# Patient Record
Sex: Male | Born: 1960
Health system: Southern US, Community
[De-identification: ages and names within clinical notes are randomized; demographics above are authoritative.]

## PROBLEM LIST (undated history)

## (undated) DIAGNOSIS — K625 Hemorrhage of anus and rectum: Secondary | ICD-10-CM

## (undated) DIAGNOSIS — K7460 Unspecified cirrhosis of liver: Secondary | ICD-10-CM

## (undated) DIAGNOSIS — F32A Depression, unspecified: Secondary | ICD-10-CM

## (undated) DIAGNOSIS — G629 Polyneuropathy, unspecified: Secondary | ICD-10-CM

## (undated) DIAGNOSIS — K429 Umbilical hernia without obstruction or gangrene: Secondary | ICD-10-CM

## (undated) DIAGNOSIS — L565 Disseminated superficial actinic porokeratosis (DSAP): Secondary | ICD-10-CM

## (undated) DIAGNOSIS — C8590 Non-Hodgkin lymphoma, unspecified, unspecified site: Secondary | ICD-10-CM

## (undated) DIAGNOSIS — C801 Malignant (primary) neoplasm, unspecified: Secondary | ICD-10-CM

## (undated) DIAGNOSIS — R42 Dizziness and giddiness: Secondary | ICD-10-CM

## (undated) DIAGNOSIS — R911 Solitary pulmonary nodule: Secondary | ICD-10-CM

## (undated) DIAGNOSIS — Z8619 Personal history of other infectious and parasitic diseases: Secondary | ICD-10-CM

## (undated) DIAGNOSIS — D649 Anemia, unspecified: Secondary | ICD-10-CM

## (undated) DIAGNOSIS — I10 Essential (primary) hypertension: Secondary | ICD-10-CM

## (undated) DIAGNOSIS — F329 Major depressive disorder, single episode, unspecified: Secondary | ICD-10-CM

## (undated) DIAGNOSIS — B1920 Unspecified viral hepatitis C without hepatic coma: Secondary | ICD-10-CM

## (undated) DIAGNOSIS — H269 Unspecified cataract: Secondary | ICD-10-CM

## (undated) DIAGNOSIS — F209 Schizophrenia, unspecified: Secondary | ICD-10-CM

## (undated) DIAGNOSIS — K219 Gastro-esophageal reflux disease without esophagitis: Secondary | ICD-10-CM

## (undated) HISTORY — DX: Unspecified cataract: H26.9

## (undated) HISTORY — DX: Unspecified cirrhosis of liver: K74.60

## (undated) HISTORY — PX: POLYPECTOMY: SHX149

## (undated) HISTORY — DX: Hemorrhage of anus and rectum: K62.5

## (undated) HISTORY — DX: Schizophrenia, unspecified: F20.9

## (undated) HISTORY — DX: Essential (primary) hypertension: I10

## (undated) HISTORY — PX: COLONOSCOPY: SHX174

## (undated) HISTORY — DX: Unspecified viral hepatitis C without hepatic coma: B19.20

## (undated) HISTORY — DX: Solitary pulmonary nodule: R91.1

## (undated) HISTORY — DX: Depression, unspecified: F32.A

## (undated) HISTORY — DX: Personal history of other infectious and parasitic diseases: Z86.19

## (undated) HISTORY — DX: Non-Hodgkin lymphoma, unspecified, unspecified site: C85.90

## (undated) HISTORY — DX: Anemia, unspecified: D64.9

## (undated) HISTORY — DX: Gastro-esophageal reflux disease without esophagitis: K21.9

---
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
## 1999-03-22 ENCOUNTER — Emergency Department (HOSPITAL_COMMUNITY): Admission: EM | Admit: 1999-03-22 | Discharge: 1999-03-22 | Payer: Self-pay | Admitting: Emergency Medicine

## 1999-03-22 ENCOUNTER — Encounter: Payer: Self-pay | Admitting: Emergency Medicine

## 2000-11-07 ENCOUNTER — Emergency Department (HOSPITAL_COMMUNITY): Admission: EM | Admit: 2000-11-07 | Discharge: 2000-11-07 | Payer: Self-pay | Admitting: Emergency Medicine

## 2000-11-07 ENCOUNTER — Encounter: Payer: Self-pay | Admitting: Internal Medicine

## 2000-11-15 ENCOUNTER — Encounter: Admission: RE | Admit: 2000-11-15 | Discharge: 2000-11-15 | Payer: Self-pay | Admitting: Internal Medicine

## 2001-01-06 ENCOUNTER — Encounter: Admission: RE | Admit: 2001-01-06 | Discharge: 2001-01-06 | Payer: Self-pay | Admitting: Internal Medicine

## 2001-01-06 ENCOUNTER — Encounter: Payer: Self-pay | Admitting: Internal Medicine

## 2001-01-06 ENCOUNTER — Ambulatory Visit (HOSPITAL_COMMUNITY): Admission: RE | Admit: 2001-01-06 | Discharge: 2001-01-06 | Payer: Self-pay | Admitting: Internal Medicine

## 2001-02-02 ENCOUNTER — Emergency Department (HOSPITAL_COMMUNITY): Admission: EM | Admit: 2001-02-02 | Discharge: 2001-02-02 | Payer: Self-pay

## 2001-02-03 ENCOUNTER — Emergency Department (HOSPITAL_COMMUNITY): Admission: EM | Admit: 2001-02-03 | Discharge: 2001-02-03 | Payer: Self-pay | Admitting: Emergency Medicine

## 2001-02-05 ENCOUNTER — Emergency Department (HOSPITAL_COMMUNITY): Admission: EM | Admit: 2001-02-05 | Discharge: 2001-02-05 | Payer: Self-pay

## 2001-02-09 ENCOUNTER — Encounter: Admission: RE | Admit: 2001-02-09 | Discharge: 2001-02-09 | Payer: Self-pay

## 2001-06-08 ENCOUNTER — Encounter: Admission: RE | Admit: 2001-06-08 | Discharge: 2001-06-08 | Payer: Self-pay | Admitting: Internal Medicine

## 2001-06-08 ENCOUNTER — Ambulatory Visit (HOSPITAL_COMMUNITY): Admission: RE | Admit: 2001-06-08 | Discharge: 2001-06-08 | Payer: Self-pay | Admitting: Internal Medicine

## 2001-06-08 ENCOUNTER — Encounter: Payer: Self-pay | Admitting: Internal Medicine

## 2001-06-30 ENCOUNTER — Encounter: Admission: RE | Admit: 2001-06-30 | Discharge: 2001-06-30 | Payer: Self-pay

## 2001-12-14 ENCOUNTER — Ambulatory Visit (HOSPITAL_BASED_OUTPATIENT_CLINIC_OR_DEPARTMENT_OTHER): Admission: RE | Admit: 2001-12-14 | Discharge: 2001-12-14 | Payer: Self-pay | Admitting: General Surgery

## 2001-12-14 ENCOUNTER — Encounter (INDEPENDENT_AMBULATORY_CARE_PROVIDER_SITE_OTHER): Payer: Self-pay | Admitting: *Deleted

## 2002-10-11 ENCOUNTER — Inpatient Hospital Stay (HOSPITAL_COMMUNITY): Admission: AD | Admit: 2002-10-11 | Discharge: 2002-10-12 | Payer: Self-pay | Admitting: Internal Medicine

## 2002-10-11 ENCOUNTER — Encounter: Admission: RE | Admit: 2002-10-11 | Discharge: 2002-10-11 | Payer: Self-pay | Admitting: Internal Medicine

## 2002-10-11 ENCOUNTER — Encounter: Payer: Self-pay | Admitting: Internal Medicine

## 2002-10-11 ENCOUNTER — Ambulatory Visit (HOSPITAL_COMMUNITY): Admission: RE | Admit: 2002-10-11 | Discharge: 2002-10-11 | Payer: Self-pay | Admitting: Internal Medicine

## 2002-10-12 ENCOUNTER — Encounter: Payer: Self-pay | Admitting: Internal Medicine

## 2003-08-14 ENCOUNTER — Encounter: Admission: RE | Admit: 2003-08-14 | Discharge: 2003-08-14 | Payer: Self-pay | Admitting: Internal Medicine

## 2003-09-05 ENCOUNTER — Inpatient Hospital Stay (HOSPITAL_COMMUNITY): Admission: EM | Admit: 2003-09-05 | Discharge: 2003-09-11 | Payer: Self-pay | Admitting: Emergency Medicine

## 2003-09-17 ENCOUNTER — Encounter (HOSPITAL_COMMUNITY): Admission: RE | Admit: 2003-09-17 | Discharge: 2003-09-17 | Payer: Self-pay | Admitting: Dentistry

## 2003-09-25 ENCOUNTER — Encounter: Admission: RE | Admit: 2003-09-25 | Discharge: 2003-09-25 | Payer: Self-pay | Admitting: Internal Medicine

## 2003-12-25 ENCOUNTER — Ambulatory Visit: Payer: Self-pay | Admitting: Dentistry

## 2004-03-09 ENCOUNTER — Ambulatory Visit: Payer: Self-pay | Admitting: Internal Medicine

## 2004-08-04 ENCOUNTER — Ambulatory Visit: Payer: Self-pay | Admitting: Gastroenterology

## 2004-08-11 ENCOUNTER — Encounter (INDEPENDENT_AMBULATORY_CARE_PROVIDER_SITE_OTHER): Payer: Self-pay | Admitting: *Deleted

## 2004-08-11 ENCOUNTER — Ambulatory Visit (HOSPITAL_COMMUNITY): Admission: RE | Admit: 2004-08-11 | Discharge: 2004-08-11 | Payer: Self-pay | Admitting: Obstetrics and Gynecology

## 2005-06-16 ENCOUNTER — Ambulatory Visit: Payer: Self-pay | Admitting: Internal Medicine

## 2005-07-12 ENCOUNTER — Ambulatory Visit (HOSPITAL_COMMUNITY): Admission: RE | Admit: 2005-07-12 | Discharge: 2005-07-12 | Payer: Self-pay | Admitting: Internal Medicine

## 2005-07-12 ENCOUNTER — Ambulatory Visit: Payer: Self-pay | Admitting: Internal Medicine

## 2005-07-28 ENCOUNTER — Ambulatory Visit: Payer: Self-pay | Admitting: Hospitalist

## 2005-10-13 ENCOUNTER — Ambulatory Visit: Payer: Self-pay | Admitting: Internal Medicine

## 2006-03-07 DIAGNOSIS — Z8619 Personal history of other infectious and parasitic diseases: Secondary | ICD-10-CM

## 2006-03-07 DIAGNOSIS — I10 Essential (primary) hypertension: Secondary | ICD-10-CM | POA: Insufficient documentation

## 2006-03-07 DIAGNOSIS — F209 Schizophrenia, unspecified: Secondary | ICD-10-CM | POA: Insufficient documentation

## 2006-03-07 HISTORY — DX: Personal history of other infectious and parasitic diseases: Z86.19

## 2006-10-19 ENCOUNTER — Telehealth: Payer: Self-pay | Admitting: *Deleted

## 2006-11-03 ENCOUNTER — Encounter (INDEPENDENT_AMBULATORY_CARE_PROVIDER_SITE_OTHER): Payer: Self-pay | Admitting: *Deleted

## 2006-11-03 ENCOUNTER — Ambulatory Visit: Payer: Self-pay | Admitting: Internal Medicine

## 2006-11-03 ENCOUNTER — Ambulatory Visit (HOSPITAL_COMMUNITY): Admission: RE | Admit: 2006-11-03 | Discharge: 2006-11-03 | Payer: Self-pay | Admitting: Internal Medicine

## 2006-11-03 DIAGNOSIS — R000 Tachycardia, unspecified: Secondary | ICD-10-CM | POA: Insufficient documentation

## 2006-11-03 DIAGNOSIS — K625 Hemorrhage of anus and rectum: Secondary | ICD-10-CM | POA: Insufficient documentation

## 2006-11-03 DIAGNOSIS — K644 Residual hemorrhoidal skin tags: Secondary | ICD-10-CM | POA: Insufficient documentation

## 2006-11-03 LAB — CONVERTED CEMR LAB
ALT: 34 units/L (ref 0–53)
AST: 39 units/L — ABNORMAL HIGH (ref 0–37)
Albumin: 3.8 g/dL (ref 3.5–5.2)
Alkaline Phosphatase: 89 units/L (ref 39–117)
BUN: 5 mg/dL — ABNORMAL LOW (ref 6–23)
Basophils Absolute: 0.1 10*3/uL (ref 0.0–0.1)
Basophils Relative: 1 % (ref 0–1)
CO2: 29 meq/L (ref 19–32)
Calcium: 9.3 mg/dL (ref 8.4–10.5)
Chloride: 108 meq/L (ref 96–112)
Creatinine, Ser: 1.02 mg/dL (ref 0.40–1.50)
Eosinophils Absolute: 0 10*3/uL (ref 0.0–0.7)
Eosinophils Relative: 0 % (ref 0–5)
Glucose, Bld: 105 mg/dL — ABNORMAL HIGH (ref 70–99)
HCT: 42.1 % (ref 39.0–52.0)
Hemoglobin: 14 g/dL (ref 13.0–17.0)
Lymphocytes Relative: 29 % (ref 12–46)
Lymphs Abs: 2.6 10*3/uL (ref 0.7–3.3)
MCHC: 33.3 g/dL (ref 30.0–36.0)
MCV: 82.1 fL (ref 78.0–100.0)
Monocytes Absolute: 1 10*3/uL — ABNORMAL HIGH (ref 0.2–0.7)
Monocytes Relative: 11 % (ref 3–11)
Neutro Abs: 5.3 10*3/uL (ref 1.7–7.7)
Neutrophils Relative %: 59 % (ref 43–77)
Platelets: 236 10*3/uL (ref 150–400)
Potassium: 3.8 meq/L (ref 3.5–5.3)
RBC: 5.13 M/uL (ref 4.22–5.81)
RDW: 12.9 % (ref 11.5–14.0)
Sodium: 139 meq/L (ref 135–145)
TSH: 1.064 microintl units/mL (ref 0.350–5.50)
Total Bilirubin: 1 mg/dL (ref 0.3–1.2)
Total Protein: 8.2 g/dL (ref 6.0–8.3)
WBC: 9 10*3/uL (ref 4.0–10.5)

## 2006-11-28 ENCOUNTER — Encounter (INDEPENDENT_AMBULATORY_CARE_PROVIDER_SITE_OTHER): Payer: Self-pay | Admitting: *Deleted

## 2006-12-30 DIAGNOSIS — K648 Other hemorrhoids: Secondary | ICD-10-CM | POA: Insufficient documentation

## 2007-03-02 ENCOUNTER — Encounter (INDEPENDENT_AMBULATORY_CARE_PROVIDER_SITE_OTHER): Payer: Self-pay | Admitting: *Deleted

## 2007-03-02 ENCOUNTER — Ambulatory Visit: Payer: Self-pay | Admitting: Internal Medicine

## 2007-03-02 DIAGNOSIS — R3919 Other difficulties with micturition: Secondary | ICD-10-CM | POA: Insufficient documentation

## 2007-03-02 LAB — CONVERTED CEMR LAB
ALT: 30 units/L (ref 0–53)
AST: 35 units/L (ref 0–37)
Albumin: 4.1 g/dL (ref 3.5–5.2)
Alkaline Phosphatase: 111 units/L (ref 39–117)
BUN: 9 mg/dL (ref 6–23)
Basophils Absolute: 0 10*3/uL (ref 0.0–0.1)
Basophils Relative: 0 % (ref 0–1)
CO2: 23 meq/L (ref 19–32)
Calcium: 9.5 mg/dL (ref 8.4–10.5)
Chloride: 104 meq/L (ref 96–112)
Creatinine, Ser: 1.05 mg/dL (ref 0.40–1.50)
Eosinophils Absolute: 0 10*3/uL (ref 0.0–0.7)
Eosinophils Relative: 0 % (ref 0–5)
Glucose, Bld: 99 mg/dL (ref 70–99)
Glucose, Urine, Semiquant: 100
HCT: 42.2 % (ref 39.0–52.0)
Hemoglobin: 13.4 g/dL (ref 13.0–17.0)
Ketones, urine, test strip: NEGATIVE
Lymphocytes Relative: 22 % (ref 12–46)
Lymphs Abs: 1.9 10*3/uL (ref 0.7–3.3)
MCHC: 31.8 g/dL (ref 30.0–36.0)
MCV: 83.4 fL (ref 78.0–100.0)
Monocytes Absolute: 1 10*3/uL — ABNORMAL HIGH (ref 0.2–0.7)
Monocytes Relative: 12 % — ABNORMAL HIGH (ref 3–11)
Neutro Abs: 5.7 10*3/uL (ref 1.7–7.7)
Neutrophils Relative %: 66 % (ref 43–77)
Nitrite: NEGATIVE
Platelets: 312 10*3/uL (ref 150–400)
Potassium: 4.4 meq/L (ref 3.5–5.3)
Protein, U semiquant: 100
RBC: 5.06 M/uL (ref 4.22–5.81)
RDW: 13 % (ref 11.5–14.0)
Sodium: 140 meq/L (ref 135–145)
Specific Gravity, Urine: >=1.03
Total Bilirubin: 1.5 mg/dL — ABNORMAL HIGH (ref 0.3–1.2)
Total Protein: 8.5 g/dL — ABNORMAL HIGH (ref 6.0–8.3)
Urobilinogen, UA: 2
WBC: 8.7 10*3/uL (ref 4.0–10.5)
pH: 5

## 2007-03-09 ENCOUNTER — Encounter (INDEPENDENT_AMBULATORY_CARE_PROVIDER_SITE_OTHER): Payer: Self-pay | Admitting: *Deleted

## 2007-03-09 ENCOUNTER — Ambulatory Visit: Payer: Self-pay | Admitting: Internal Medicine

## 2007-05-19 ENCOUNTER — Encounter (INDEPENDENT_AMBULATORY_CARE_PROVIDER_SITE_OTHER): Payer: Self-pay | Admitting: Infectious Diseases

## 2007-05-19 ENCOUNTER — Ambulatory Visit: Payer: Self-pay | Admitting: Internal Medicine

## 2007-05-19 DIAGNOSIS — R1084 Generalized abdominal pain: Secondary | ICD-10-CM | POA: Insufficient documentation

## 2007-05-24 LAB — CONVERTED CEMR LAB
ALT: 24 units/L (ref 0–53)
AST: 49 units/L — ABNORMAL HIGH (ref 0–37)
Albumin: 3.9 g/dL (ref 3.5–5.2)
Alkaline Phosphatase: 139 units/L — ABNORMAL HIGH (ref 39–117)
BUN: 6 mg/dL (ref 6–23)
CO2: 21 meq/L (ref 19–32)
Calcium: 9.3 mg/dL (ref 8.4–10.5)
Chloride: 104 meq/L (ref 96–112)
Creatinine, Ser: 0.89 mg/dL (ref 0.40–1.50)
Glucose, Bld: 103 mg/dL — ABNORMAL HIGH (ref 70–99)
HCT: 39.9 % (ref 39.0–52.0)
Hemoglobin: 12.4 g/dL — ABNORMAL LOW (ref 13.0–17.0)
Lipase: 16 units/L (ref 0–75)
MCHC: 31.1 g/dL (ref 30.0–36.0)
MCV: 79 fL (ref 78.0–100.0)
Platelets: 305 10*3/uL (ref 150–400)
Potassium: 4.1 meq/L (ref 3.5–5.3)
RBC: 5.05 M/uL (ref 4.22–5.81)
RDW: 14.9 % (ref 11.5–15.5)
Sodium: 140 meq/L (ref 135–145)
Total Bilirubin: 1.5 mg/dL — ABNORMAL HIGH (ref 0.3–1.2)
Total Protein: 7.8 g/dL (ref 6.0–8.3)
WBC: 8 10*3/uL (ref 4.0–10.5)

## 2007-06-08 ENCOUNTER — Encounter (INDEPENDENT_AMBULATORY_CARE_PROVIDER_SITE_OTHER): Payer: Self-pay | Admitting: *Deleted

## 2007-06-08 ENCOUNTER — Ambulatory Visit (HOSPITAL_COMMUNITY): Admission: RE | Admit: 2007-06-08 | Discharge: 2007-06-08 | Payer: Self-pay | Admitting: Internal Medicine

## 2007-06-08 ENCOUNTER — Ambulatory Visit: Payer: Self-pay | Admitting: Internal Medicine

## 2007-06-08 LAB — CONVERTED CEMR LAB
ALT: 19 units/L (ref 0–53)
AST: 54 units/L — ABNORMAL HIGH (ref 0–37)
Albumin: 4.2 g/dL (ref 3.5–5.2)
Alkaline Phosphatase: 131 units/L — ABNORMAL HIGH (ref 39–117)
BUN: 9 mg/dL (ref 6–23)
Basophils Absolute: 0 10*3/uL (ref 0.0–0.1)
Basophils Relative: 0 % (ref 0–1)
CO2: 21 meq/L (ref 19–32)
Calcium: 10 mg/dL (ref 8.4–10.5)
Chloride: 102 meq/L (ref 96–112)
Creatinine, Ser: 0.84 mg/dL (ref 0.40–1.50)
Eosinophils Absolute: 0 10*3/uL (ref 0.0–0.7)
Eosinophils Relative: 0 % (ref 0–5)
Glucose, Bld: 95 mg/dL (ref 70–99)
HCT: 40.6 % (ref 39.0–52.0)
Hemoglobin, Urine: NEGATIVE
Hemoglobin: 12.4 g/dL — ABNORMAL LOW (ref 13.0–17.0)
Ketones, ur: 15 mg/dL — AB
Lipase: 15 units/L (ref 0–75)
Lymphocytes Relative: 20 % (ref 12–46)
Lymphs Abs: 1.8 10*3/uL (ref 0.7–4.0)
MCHC: 30.5 g/dL (ref 30.0–36.0)
MCV: 76.9 fL — ABNORMAL LOW (ref 78.0–100.0)
Monocytes Absolute: 1.4 10*3/uL — ABNORMAL HIGH (ref 0.1–1.0)
Monocytes Relative: 16 % — ABNORMAL HIGH (ref 3–12)
Neutro Abs: 5.7 10*3/uL (ref 1.7–7.7)
Neutrophils Relative %: 63 % (ref 43–77)
Nitrite: NEGATIVE
Platelets: 409 10*3/uL — ABNORMAL HIGH (ref 150–400)
Potassium: 4 meq/L (ref 3.5–5.3)
Protein, ur: NEGATIVE mg/dL
RBC / HPF: NONE SEEN (ref <3)
RBC: 5.28 M/uL (ref 4.22–5.81)
RDW: 15.2 % (ref 11.5–15.5)
Sodium: 140 meq/L (ref 135–145)
Specific Gravity, Urine: 1.022 (ref 1.005–1.03)
TSH: 1.484 microintl units/mL (ref 0.350–5.50)
Total Bilirubin: 1.3 mg/dL — ABNORMAL HIGH (ref 0.3–1.2)
Total Protein: 8.4 g/dL — ABNORMAL HIGH (ref 6.0–8.3)
Urine Glucose: NEGATIVE mg/dL
Urobilinogen, UA: 2 — ABNORMAL HIGH (ref 0.0–1.0)
WBC: 9 10*3/uL (ref 4.0–10.5)
pH: 5.5 (ref 5.0–8.0)

## 2007-06-09 ENCOUNTER — Encounter (INDEPENDENT_AMBULATORY_CARE_PROVIDER_SITE_OTHER): Payer: Self-pay | Admitting: *Deleted

## 2007-06-13 ENCOUNTER — Ambulatory Visit: Payer: Self-pay | Admitting: Internal Medicine

## 2007-06-13 ENCOUNTER — Ambulatory Visit (HOSPITAL_COMMUNITY): Admission: RE | Admit: 2007-06-13 | Discharge: 2007-06-13 | Payer: Self-pay | Admitting: *Deleted

## 2007-06-13 DIAGNOSIS — R935 Abnormal findings on diagnostic imaging of other abdominal regions, including retroperitoneum: Secondary | ICD-10-CM | POA: Insufficient documentation

## 2007-06-13 LAB — CONVERTED CEMR LAB
INR: 1 (ref 0.0–1.5)
Prothrombin Time: 13.3 s (ref 11.6–15.2)
aPTT: 28 s (ref 24–37)

## 2007-06-21 ENCOUNTER — Encounter (INDEPENDENT_AMBULATORY_CARE_PROVIDER_SITE_OTHER): Payer: Self-pay | Admitting: Interventional Radiology

## 2007-06-21 ENCOUNTER — Ambulatory Visit (HOSPITAL_COMMUNITY): Admission: RE | Admit: 2007-06-21 | Discharge: 2007-06-21 | Payer: Self-pay | Admitting: Internal Medicine

## 2007-06-28 ENCOUNTER — Ambulatory Visit: Payer: Self-pay | Admitting: Internal Medicine

## 2007-06-28 ENCOUNTER — Encounter (INDEPENDENT_AMBULATORY_CARE_PROVIDER_SITE_OTHER): Payer: Self-pay | Admitting: Internal Medicine

## 2007-06-28 ENCOUNTER — Encounter (INDEPENDENT_AMBULATORY_CARE_PROVIDER_SITE_OTHER): Payer: Self-pay | Admitting: *Deleted

## 2007-06-30 ENCOUNTER — Encounter (INDEPENDENT_AMBULATORY_CARE_PROVIDER_SITE_OTHER): Payer: Self-pay | Admitting: Interventional Radiology

## 2007-06-30 ENCOUNTER — Ambulatory Visit (HOSPITAL_COMMUNITY): Admission: RE | Admit: 2007-06-30 | Discharge: 2007-06-30 | Payer: Self-pay | Admitting: Hospitalist

## 2007-06-30 LAB — CONVERTED CEMR LAB
AFP-Tumor Marker: 10.2 ng/mL — ABNORMAL HIGH (ref 0.0–8.0)
HCT: 38.2 % — ABNORMAL LOW (ref 39.0–52.0)
Hemoglobin: 12.2 g/dL — ABNORMAL LOW (ref 13.0–17.0)
MCHC: 31.9 g/dL (ref 30.0–36.0)
MCV: 74.2 fL — ABNORMAL LOW (ref 78.0–100.0)
Platelets: 396 10*3/uL (ref 150–400)
RBC: 5.15 M/uL (ref 4.22–5.81)
RDW: 15.6 % — ABNORMAL HIGH (ref 11.5–15.5)
WBC: 10.5 10*3/uL (ref 4.0–10.5)

## 2007-07-03 ENCOUNTER — Ambulatory Visit: Payer: Self-pay | Admitting: Infectious Diseases

## 2007-07-03 DIAGNOSIS — R599 Enlarged lymph nodes, unspecified: Secondary | ICD-10-CM | POA: Insufficient documentation

## 2007-07-10 ENCOUNTER — Ambulatory Visit: Payer: Self-pay | Admitting: Internal Medicine

## 2007-07-10 ENCOUNTER — Inpatient Hospital Stay (HOSPITAL_COMMUNITY): Admission: EM | Admit: 2007-07-10 | Discharge: 2007-08-29 | Payer: Self-pay | Admitting: Emergency Medicine

## 2007-07-13 ENCOUNTER — Encounter: Payer: Self-pay | Admitting: Internal Medicine

## 2007-07-13 ENCOUNTER — Ambulatory Visit: Payer: Self-pay | Admitting: Cardiology

## 2007-07-13 ENCOUNTER — Ambulatory Visit: Payer: Self-pay | Admitting: Internal Medicine

## 2007-08-07 ENCOUNTER — Ambulatory Visit: Payer: Self-pay | Admitting: Psychiatry

## 2007-09-05 ENCOUNTER — Ambulatory Visit: Payer: Self-pay | Admitting: Internal Medicine

## 2007-09-07 ENCOUNTER — Encounter (INDEPENDENT_AMBULATORY_CARE_PROVIDER_SITE_OTHER): Payer: Self-pay | Admitting: *Deleted

## 2007-09-14 LAB — CBC WITH DIFFERENTIAL/PLATELET
BASO%: 1 % (ref 0.0–2.0)
Basophils Absolute: 0.1 10*3/uL (ref 0.0–0.1)
EOS%: 0.2 % (ref 0.0–7.0)
Eosinophils Absolute: 0 10*3/uL (ref 0.0–0.5)
HCT: 36.6 % — ABNORMAL LOW (ref 38.7–49.9)
HGB: 12.1 g/dL — ABNORMAL LOW (ref 13.0–17.1)
LYMPH%: 21.8 % (ref 14.0–48.0)
MCH: 26.3 pg — ABNORMAL LOW (ref 28.0–33.4)
MCHC: 33.1 g/dL (ref 32.0–35.9)
MCV: 79.3 fL — ABNORMAL LOW (ref 81.6–98.0)
MONO#: 1.3 10*3/uL — ABNORMAL HIGH (ref 0.1–0.9)
MONO%: 12.3 % (ref 0.0–13.0)
NEUT#: 7 10*3/uL — ABNORMAL HIGH (ref 1.5–6.5)
NEUT%: 64.8 % (ref 40.0–75.0)
Platelets: 238 10*3/uL (ref 145–400)
RBC: 4.61 10*6/uL (ref 4.20–5.71)
RDW: 18.7 % — ABNORMAL HIGH (ref 11.2–14.6)
WBC: 10.8 10*3/uL — ABNORMAL HIGH (ref 4.0–10.0)
lymph#: 2.4 10*3/uL (ref 0.9–3.3)

## 2007-09-21 LAB — CBC WITH DIFFERENTIAL/PLATELET
BASO%: 0.3 % (ref 0.0–2.0)
Basophils Absolute: 0 10*3/uL (ref 0.0–0.1)
EOS%: 0.7 % (ref 0.0–7.0)
Eosinophils Absolute: 0.1 10*3/uL (ref 0.0–0.5)
HCT: 35.9 % — ABNORMAL LOW (ref 38.7–49.9)
HGB: 11.9 g/dL — ABNORMAL LOW (ref 13.0–17.1)
LYMPH%: 13.2 % — ABNORMAL LOW (ref 14.0–48.0)
MCH: 26.1 pg — ABNORMAL LOW (ref 28.0–33.4)
MCHC: 33.2 g/dL (ref 32.0–35.9)
MCV: 78.8 fL — ABNORMAL LOW (ref 81.6–98.0)
MONO#: 0.3 10*3/uL (ref 0.1–0.9)
MONO%: 2.5 % (ref 0.0–13.0)
NEUT#: 8.7 10*3/uL — ABNORMAL HIGH (ref 1.5–6.5)
NEUT%: 83.3 % — ABNORMAL HIGH (ref 40.0–75.0)
Platelets: 190 10*3/uL (ref 145–400)
RBC: 4.55 10*6/uL (ref 4.20–5.71)
RDW: 19 % — ABNORMAL HIGH (ref 11.2–14.6)
WBC: 10.5 10*3/uL — ABNORMAL HIGH (ref 4.0–10.0)
lymph#: 1.4 10*3/uL (ref 0.9–3.3)

## 2007-09-21 LAB — LACTATE DEHYDROGENASE: LDH: 130 U/L (ref 94–250)

## 2007-09-21 LAB — COMPREHENSIVE METABOLIC PANEL
ALT: 43 U/L (ref 0–53)
AST: 29 U/L (ref 0–37)
Albumin: 4 g/dL (ref 3.5–5.2)
Alkaline Phosphatase: 97 U/L (ref 39–117)
BUN: 9 mg/dL (ref 6–23)
CO2: 24 mEq/L (ref 19–32)
Calcium: 9.7 mg/dL (ref 8.4–10.5)
Chloride: 104 mEq/L (ref 96–112)
Creatinine, Ser: 0.45 mg/dL (ref 0.40–1.50)
Glucose, Bld: 104 mg/dL — ABNORMAL HIGH (ref 70–99)
Potassium: 4.1 mEq/L (ref 3.5–5.3)
Sodium: 138 mEq/L (ref 135–145)
Total Bilirubin: 1.4 mg/dL — ABNORMAL HIGH (ref 0.3–1.2)
Total Protein: 6.9 g/dL (ref 6.0–8.3)

## 2007-09-26 ENCOUNTER — Ambulatory Visit (HOSPITAL_COMMUNITY): Admission: RE | Admit: 2007-09-26 | Discharge: 2007-09-26 | Payer: Self-pay | Admitting: Internal Medicine

## 2007-09-26 ENCOUNTER — Ambulatory Visit: Admission: RE | Admit: 2007-09-26 | Discharge: 2007-09-26 | Payer: Self-pay | Admitting: Internal Medicine

## 2007-09-28 LAB — COMPREHENSIVE METABOLIC PANEL
ALT: 29 U/L (ref 0–53)
AST: 26 U/L (ref 0–37)
Albumin: 3.9 g/dL (ref 3.5–5.2)
Alkaline Phosphatase: 76 U/L (ref 39–117)
BUN: 10 mg/dL (ref 6–23)
CO2: 24 mEq/L (ref 19–32)
Calcium: 9.5 mg/dL (ref 8.4–10.5)
Chloride: 109 mEq/L (ref 96–112)
Creatinine, Ser: 0.55 mg/dL (ref 0.40–1.50)
Glucose, Bld: 104 mg/dL — ABNORMAL HIGH (ref 70–99)
Potassium: 4 mEq/L (ref 3.5–5.3)
Sodium: 144 mEq/L (ref 135–145)
Total Bilirubin: 1 mg/dL (ref 0.3–1.2)
Total Protein: 6.9 g/dL (ref 6.0–8.3)

## 2007-09-28 LAB — CBC WITH DIFFERENTIAL/PLATELET
BASO%: 0.3 % (ref 0.0–2.0)
Basophils Absolute: 0 10*3/uL (ref 0.0–0.1)
EOS%: 0.5 % (ref 0.0–7.0)
Eosinophils Absolute: 0 10*3/uL (ref 0.0–0.5)
HCT: 36.1 % — ABNORMAL LOW (ref 38.7–49.9)
HGB: 12.1 g/dL — ABNORMAL LOW (ref 13.0–17.1)
LYMPH%: 18.7 % (ref 14.0–48.0)
MCH: 26.4 pg — ABNORMAL LOW (ref 28.0–33.4)
MCHC: 33.4 g/dL (ref 32.0–35.9)
MCV: 79 fL — ABNORMAL LOW (ref 81.6–98.0)
MONO#: 0.7 10*3/uL (ref 0.1–0.9)
MONO%: 8.5 % (ref 0.0–13.0)
NEUT#: 6.1 10*3/uL (ref 1.5–6.5)
NEUT%: 72 % (ref 40.0–75.0)
Platelets: 143 10*3/uL — ABNORMAL LOW (ref 145–400)
RBC: 4.57 10*6/uL (ref 4.20–5.71)
RDW: 18.4 % — ABNORMAL HIGH (ref 11.2–14.6)
WBC: 8.4 10*3/uL (ref 4.0–10.0)
lymph#: 1.6 10*3/uL (ref 0.9–3.3)

## 2007-09-28 LAB — LACTATE DEHYDROGENASE: LDH: 105 U/L (ref 94–250)

## 2007-10-05 ENCOUNTER — Ambulatory Visit: Payer: Self-pay | Admitting: Internal Medicine

## 2007-10-05 ENCOUNTER — Encounter (INDEPENDENT_AMBULATORY_CARE_PROVIDER_SITE_OTHER): Payer: Self-pay | Admitting: *Deleted

## 2007-10-05 LAB — LACTATE DEHYDROGENASE: LDH: 151 U/L (ref 94–250)

## 2007-10-05 LAB — COMPREHENSIVE METABOLIC PANEL
ALT: 32 U/L (ref 0–53)
AST: 35 U/L (ref 0–37)
Albumin: 4.1 g/dL (ref 3.5–5.2)
Alkaline Phosphatase: 71 U/L (ref 39–117)
BUN: 10 mg/dL (ref 6–23)
CO2: 24 mEq/L (ref 19–32)
Calcium: 9.9 mg/dL (ref 8.4–10.5)
Chloride: 105 mEq/L (ref 96–112)
Creatinine, Ser: 0.55 mg/dL (ref 0.40–1.50)
Glucose, Bld: 93 mg/dL (ref 70–99)
Potassium: 4.3 mEq/L (ref 3.5–5.3)
Sodium: 141 mEq/L (ref 135–145)
Total Bilirubin: 1 mg/dL (ref 0.3–1.2)
Total Protein: 7.3 g/dL (ref 6.0–8.3)

## 2007-10-05 LAB — CBC WITH DIFFERENTIAL/PLATELET
BASO%: 0.7 % (ref 0.0–2.0)
Basophils Absolute: 0.1 10*3/uL (ref 0.0–0.1)
EOS%: 0.2 % (ref 0.0–7.0)
Eosinophils Absolute: 0 10*3/uL (ref 0.0–0.5)
HCT: 36.4 % — ABNORMAL LOW (ref 38.7–49.9)
HGB: 11.9 g/dL — ABNORMAL LOW (ref 13.0–17.1)
LYMPH%: 18.3 % (ref 14.0–48.0)
MCH: 26.6 pg — ABNORMAL LOW (ref 28.0–33.4)
MCHC: 32.8 g/dL (ref 32.0–35.9)
MCV: 81.1 fL — ABNORMAL LOW (ref 81.6–98.0)
MONO#: 1.2 10*3/uL — ABNORMAL HIGH (ref 0.1–0.9)
MONO%: 11.3 % (ref 0.0–13.0)
NEUT#: 7.2 10*3/uL — ABNORMAL HIGH (ref 1.5–6.5)
NEUT%: 69.6 % (ref 40.0–75.0)
Platelets: 163 10*3/uL (ref 145–400)
RBC: 4.48 10*6/uL (ref 4.20–5.71)
RDW: 16.8 % — ABNORMAL HIGH (ref 11.2–14.6)
WBC: 10.4 10*3/uL — ABNORMAL HIGH (ref 4.0–10.0)
lymph#: 1.9 10*3/uL (ref 0.9–3.3)

## 2007-10-12 LAB — CBC WITH DIFFERENTIAL/PLATELET
BASO%: 0.3 % (ref 0.0–2.0)
Basophils Absolute: 0 10*3/uL (ref 0.0–0.1)
EOS%: 0.5 % (ref 0.0–7.0)
Eosinophils Absolute: 0 10*3/uL (ref 0.0–0.5)
HCT: 33.1 % — ABNORMAL LOW (ref 38.7–49.9)
HGB: 11.3 g/dL — ABNORMAL LOW (ref 13.0–17.1)
LYMPH%: 17.7 % (ref 14.0–48.0)
MCH: 27 pg — ABNORMAL LOW (ref 28.0–33.4)
MCHC: 34 g/dL (ref 32.0–35.9)
MCV: 79.4 fL — ABNORMAL LOW (ref 81.6–98.0)
MONO#: 0.1 10*3/uL (ref 0.1–0.9)
MONO%: 2 % (ref 0.0–13.0)
NEUT#: 4.3 10*3/uL (ref 1.5–6.5)
NEUT%: 79.5 % — ABNORMAL HIGH (ref 40.0–75.0)
Platelets: 169 10*3/uL (ref 145–400)
RBC: 4.17 10*6/uL — ABNORMAL LOW (ref 4.20–5.71)
RDW: 17.3 % — ABNORMAL HIGH (ref 11.2–14.6)
WBC: 5.4 10*3/uL (ref 4.0–10.0)
lymph#: 1 10*3/uL (ref 0.9–3.3)

## 2007-10-12 LAB — COMPREHENSIVE METABOLIC PANEL
ALT: 38 U/L (ref 0–53)
AST: 28 U/L (ref 0–37)
Albumin: 3.9 g/dL (ref 3.5–5.2)
Alkaline Phosphatase: 90 U/L (ref 39–117)
BUN: 10 mg/dL (ref 6–23)
CO2: 27 mEq/L (ref 19–32)
Calcium: 9.3 mg/dL (ref 8.4–10.5)
Chloride: 104 mEq/L (ref 96–112)
Creatinine, Ser: 0.52 mg/dL (ref 0.40–1.50)
Glucose, Bld: 99 mg/dL (ref 70–99)
Potassium: 4.2 mEq/L (ref 3.5–5.3)
Sodium: 139 mEq/L (ref 135–145)
Total Bilirubin: 1.7 mg/dL — ABNORMAL HIGH (ref 0.3–1.2)
Total Protein: 6.7 g/dL (ref 6.0–8.3)

## 2007-10-12 LAB — LACTATE DEHYDROGENASE: LDH: 111 U/L (ref 94–250)

## 2007-10-19 ENCOUNTER — Ambulatory Visit (HOSPITAL_COMMUNITY): Admission: RE | Admit: 2007-10-19 | Discharge: 2007-10-19 | Payer: Self-pay | Admitting: Internal Medicine

## 2007-10-19 LAB — COMPREHENSIVE METABOLIC PANEL
ALT: 34 U/L (ref 0–53)
AST: 32 U/L (ref 0–37)
Albumin: 3.8 g/dL (ref 3.5–5.2)
Alkaline Phosphatase: 73 U/L (ref 39–117)
BUN: 9 mg/dL (ref 6–23)
CO2: 23 mEq/L (ref 19–32)
Calcium: 9.2 mg/dL (ref 8.4–10.5)
Chloride: 107 mEq/L (ref 96–112)
Creatinine, Ser: 0.6 mg/dL (ref 0.40–1.50)
Glucose, Bld: 93 mg/dL (ref 70–99)
Potassium: 4 mEq/L (ref 3.5–5.3)
Sodium: 140 mEq/L (ref 135–145)
Total Bilirubin: 1 mg/dL (ref 0.3–1.2)
Total Protein: 6.6 g/dL (ref 6.0–8.3)

## 2007-10-19 LAB — CBC WITH DIFFERENTIAL/PLATELET
BASO%: 0.4 % (ref 0.0–2.0)
Basophils Absolute: 0 10*3/uL (ref 0.0–0.1)
EOS%: 0.2 % (ref 0.0–7.0)
Eosinophils Absolute: 0 10*3/uL (ref 0.0–0.5)
HCT: 34.6 % — ABNORMAL LOW (ref 38.7–49.9)
HGB: 11.6 g/dL — ABNORMAL LOW (ref 13.0–17.1)
LYMPH%: 14.6 % (ref 14.0–48.0)
MCH: 26.9 pg — ABNORMAL LOW (ref 28.0–33.4)
MCHC: 33.4 g/dL (ref 32.0–35.9)
MCV: 80.5 fL — ABNORMAL LOW (ref 81.6–98.0)
MONO#: 1 10*3/uL — ABNORMAL HIGH (ref 0.1–0.9)
MONO%: 9.7 % (ref 0.0–13.0)
NEUT#: 8 10*3/uL — ABNORMAL HIGH (ref 1.5–6.5)
NEUT%: 75.1 % — ABNORMAL HIGH (ref 40.0–75.0)
Platelets: 181 10*3/uL (ref 145–400)
RBC: 4.3 10*6/uL (ref 4.20–5.71)
RDW: 17.9 % — ABNORMAL HIGH (ref 11.2–14.6)
WBC: 10.6 10*3/uL — ABNORMAL HIGH (ref 4.0–10.0)
lymph#: 1.6 10*3/uL (ref 0.9–3.3)

## 2007-10-19 LAB — LACTATE DEHYDROGENASE: LDH: 130 U/L (ref 94–250)

## 2007-10-24 ENCOUNTER — Ambulatory Visit: Payer: Self-pay | Admitting: Internal Medicine

## 2007-10-26 LAB — CBC WITH DIFFERENTIAL/PLATELET
BASO%: 0.8 % (ref 0.0–2.0)
Basophils Absolute: 0.1 10*3/uL (ref 0.0–0.1)
EOS%: 0.1 % (ref 0.0–7.0)
Eosinophils Absolute: 0 10*3/uL (ref 0.0–0.5)
HCT: 35.2 % — ABNORMAL LOW (ref 38.7–49.9)
HGB: 11.8 g/dL — ABNORMAL LOW (ref 13.0–17.1)
LYMPH%: 16.3 % (ref 14.0–48.0)
MCH: 27.7 pg — ABNORMAL LOW (ref 28.0–33.4)
MCHC: 33.6 g/dL (ref 32.0–35.9)
MCV: 82.5 fL (ref 81.6–98.0)
MONO#: 0.9 10*3/uL (ref 0.1–0.9)
MONO%: 9 % (ref 0.0–13.0)
NEUT#: 7.1 10*3/uL — ABNORMAL HIGH (ref 1.5–6.5)
NEUT%: 73.8 % (ref 40.0–75.0)
Platelets: 208 10*3/uL (ref 145–400)
RBC: 4.26 10*6/uL (ref 4.20–5.71)
RDW: 17.1 % — ABNORMAL HIGH (ref 11.2–14.6)
WBC: 9.6 10*3/uL (ref 4.0–10.0)
lymph#: 1.6 10*3/uL (ref 0.9–3.3)

## 2007-10-26 LAB — COMPREHENSIVE METABOLIC PANEL
ALT: 32 U/L (ref 0–53)
AST: 43 U/L — ABNORMAL HIGH (ref 0–37)
Albumin: 4.3 g/dL (ref 3.5–5.2)
Alkaline Phosphatase: 67 U/L (ref 39–117)
BUN: 11 mg/dL (ref 6–23)
CO2: 25 mEq/L (ref 19–32)
Calcium: 9.8 mg/dL (ref 8.4–10.5)
Chloride: 103 mEq/L (ref 96–112)
Creatinine, Ser: 0.63 mg/dL (ref 0.40–1.50)
Glucose, Bld: 98 mg/dL (ref 70–99)
Potassium: 4.4 mEq/L (ref 3.5–5.3)
Sodium: 141 mEq/L (ref 135–145)
Total Bilirubin: 1 mg/dL (ref 0.3–1.2)
Total Protein: 7.5 g/dL (ref 6.0–8.3)

## 2007-10-26 LAB — LACTATE DEHYDROGENASE: LDH: 154 U/L (ref 94–250)

## 2007-11-02 LAB — CBC WITH DIFFERENTIAL/PLATELET
BASO%: 0.4 % (ref 0.0–2.0)
Basophils Absolute: 0 10*3/uL (ref 0.0–0.1)
EOS%: 0.1 % (ref 0.0–7.0)
Eosinophils Absolute: 0 10*3/uL (ref 0.0–0.5)
HCT: 34.2 % — ABNORMAL LOW (ref 38.7–49.9)
HGB: 11.7 g/dL — ABNORMAL LOW (ref 13.0–17.1)
LYMPH%: 21.2 % (ref 14.0–48.0)
MCH: 27.9 pg — ABNORMAL LOW (ref 28.0–33.4)
MCHC: 34.3 g/dL (ref 32.0–35.9)
MCV: 81.1 fL — ABNORMAL LOW (ref 81.6–98.0)
MONO#: 0.2 10*3/uL (ref 0.1–0.9)
MONO%: 5.3 % (ref 0.0–13.0)
NEUT#: 2.8 10*3/uL (ref 1.5–6.5)
NEUT%: 73 % (ref 40.0–75.0)
Platelets: 136 10*3/uL — ABNORMAL LOW (ref 145–400)
RBC: 4.21 10*6/uL (ref 4.20–5.71)
RDW: 16.9 % — ABNORMAL HIGH (ref 11.2–14.6)
WBC: 3.8 10*3/uL — ABNORMAL LOW (ref 4.0–10.0)
lymph#: 0.8 10*3/uL — ABNORMAL LOW (ref 0.9–3.3)

## 2007-11-02 LAB — COMPREHENSIVE METABOLIC PANEL
ALT: 41 U/L (ref 0–53)
AST: 31 U/L (ref 0–37)
Albumin: 4 g/dL (ref 3.5–5.2)
Alkaline Phosphatase: 94 U/L (ref 39–117)
BUN: 7 mg/dL (ref 6–23)
CO2: 24 mEq/L (ref 19–32)
Calcium: 9.1 mg/dL (ref 8.4–10.5)
Chloride: 104 mEq/L (ref 96–112)
Creatinine, Ser: 0.62 mg/dL (ref 0.40–1.50)
Glucose, Bld: 110 mg/dL — ABNORMAL HIGH (ref 70–99)
Potassium: 3.9 mEq/L (ref 3.5–5.3)
Sodium: 138 mEq/L (ref 135–145)
Total Bilirubin: 1.8 mg/dL — ABNORMAL HIGH (ref 0.3–1.2)
Total Protein: 6.8 g/dL (ref 6.0–8.3)

## 2007-11-02 LAB — LACTATE DEHYDROGENASE: LDH: 154 U/L (ref 94–250)

## 2007-11-09 LAB — CBC WITH DIFFERENTIAL/PLATELET
BASO%: 0.4 % (ref 0.0–2.0)
Basophils Absolute: 0 10*3/uL (ref 0.0–0.1)
EOS%: 0.1 % (ref 0.0–7.0)
Eosinophils Absolute: 0 10*3/uL (ref 0.0–0.5)
HCT: 33.9 % — ABNORMAL LOW (ref 38.7–49.9)
HGB: 11.6 g/dL — ABNORMAL LOW (ref 13.0–17.1)
LYMPH%: 11.1 % — ABNORMAL LOW (ref 14.0–48.0)
MCH: 28 pg (ref 28.0–33.4)
MCHC: 34.2 g/dL (ref 32.0–35.9)
MCV: 82 fL (ref 81.6–98.0)
MONO#: 0.9 10*3/uL (ref 0.1–0.9)
MONO%: 10.1 % (ref 0.0–13.0)
NEUT#: 7.3 10*3/uL — ABNORMAL HIGH (ref 1.5–6.5)
NEUT%: 78.3 % — ABNORMAL HIGH (ref 40.0–75.0)
Platelets: 160 10*3/uL (ref 145–400)
RBC: 4.14 10*6/uL — ABNORMAL LOW (ref 4.20–5.71)
RDW: 16.6 % — ABNORMAL HIGH (ref 11.2–14.6)
WBC: 9.3 10*3/uL (ref 4.0–10.0)
lymph#: 1 10*3/uL (ref 0.9–3.3)

## 2007-11-09 LAB — COMPREHENSIVE METABOLIC PANEL
ALT: 30 U/L (ref 0–53)
AST: 32 U/L (ref 0–37)
Albumin: 3.7 g/dL (ref 3.5–5.2)
Alkaline Phosphatase: 80 U/L (ref 39–117)
BUN: 8 mg/dL (ref 6–23)
CO2: 23 mEq/L (ref 19–32)
Calcium: 8.7 mg/dL (ref 8.4–10.5)
Chloride: 106 mEq/L (ref 96–112)
Creatinine, Ser: 0.69 mg/dL (ref 0.40–1.50)
Glucose, Bld: 89 mg/dL (ref 70–99)
Potassium: 3.7 mEq/L (ref 3.5–5.3)
Sodium: 139 mEq/L (ref 135–145)
Total Bilirubin: 0.9 mg/dL (ref 0.3–1.2)
Total Protein: 6.3 g/dL (ref 6.0–8.3)

## 2007-11-09 LAB — LACTATE DEHYDROGENASE: LDH: 136 U/L (ref 94–250)

## 2007-11-16 LAB — COMPREHENSIVE METABOLIC PANEL
ALT: 34 U/L (ref 0–53)
AST: 50 U/L — ABNORMAL HIGH (ref 0–37)
Albumin: 4 g/dL (ref 3.5–5.2)
Alkaline Phosphatase: 92 U/L (ref 39–117)
BUN: 8 mg/dL (ref 6–23)
CO2: 21 mEq/L (ref 19–32)
Calcium: 9 mg/dL (ref 8.4–10.5)
Chloride: 107 mEq/L (ref 96–112)
Creatinine, Ser: 0.64 mg/dL (ref 0.40–1.50)
Glucose, Bld: 142 mg/dL — ABNORMAL HIGH (ref 70–99)
Potassium: 3.9 mEq/L (ref 3.5–5.3)
Sodium: 140 mEq/L (ref 135–145)
Total Bilirubin: 0.8 mg/dL (ref 0.3–1.2)
Total Protein: 6.5 g/dL (ref 6.0–8.3)

## 2007-11-16 LAB — CBC WITH DIFFERENTIAL/PLATELET
BASO%: 0.4 % (ref 0.0–2.0)
Basophils Absolute: 0 10*3/uL (ref 0.0–0.1)
EOS%: 0.3 % (ref 0.0–7.0)
Eosinophils Absolute: 0 10*3/uL (ref 0.0–0.5)
HCT: 35.9 % — ABNORMAL LOW (ref 38.7–49.9)
HGB: 11.8 g/dL — ABNORMAL LOW (ref 13.0–17.1)
LYMPH%: 19 % (ref 14.0–48.0)
MCH: 27.6 pg — ABNORMAL LOW (ref 28.0–33.4)
MCHC: 33 g/dL (ref 32.0–35.9)
MCV: 83.5 fL (ref 81.6–98.0)
MONO#: 0.7 10*3/uL (ref 0.1–0.9)
MONO%: 11.7 % (ref 0.0–13.0)
NEUT#: 4.1 10*3/uL (ref 1.5–6.5)
NEUT%: 68.5 % (ref 40.0–75.0)
Platelets: 212 10*3/uL (ref 145–400)
RBC: 4.3 10*6/uL (ref 4.20–5.71)
RDW: 15.6 % — ABNORMAL HIGH (ref 11.2–14.6)
WBC: 6 10*3/uL (ref 4.0–10.0)
lymph#: 1.1 10*3/uL (ref 0.9–3.3)

## 2007-11-16 LAB — LACTATE DEHYDROGENASE: LDH: 134 U/L (ref 94–250)

## 2007-11-20 ENCOUNTER — Ambulatory Visit (HOSPITAL_COMMUNITY): Admission: RE | Admit: 2007-11-20 | Discharge: 2007-11-20 | Payer: Self-pay | Admitting: Internal Medicine

## 2007-11-23 LAB — CBC WITH DIFFERENTIAL/PLATELET
BASO%: 1.1 % (ref 0.0–2.0)
Basophils Absolute: 0 10*3/uL (ref 0.0–0.1)
EOS%: 0.4 % (ref 0.0–7.0)
Eosinophils Absolute: 0 10*3/uL (ref 0.0–0.5)
HCT: 30.4 % — ABNORMAL LOW (ref 38.7–49.9)
HGB: 10.2 g/dL — ABNORMAL LOW (ref 13.0–17.1)
LYMPH%: 45.3 % (ref 14.0–48.0)
MCH: 28.6 pg (ref 28.0–33.4)
MCHC: 33.5 g/dL (ref 32.0–35.9)
MCV: 85.2 fL (ref 81.6–98.0)
MONO#: 0.1 10*3/uL (ref 0.1–0.9)
MONO%: 8.8 % (ref 0.0–13.0)
NEUT#: 0.4 10*3/uL — CL (ref 1.5–6.5)
NEUT%: 44.4 % (ref 40.0–75.0)
Platelets: 82 10*3/uL — ABNORMAL LOW (ref 145–400)
RBC: 3.56 10*6/uL — ABNORMAL LOW (ref 4.20–5.71)
RDW: 15.7 % — ABNORMAL HIGH (ref 11.2–14.6)
WBC: 0.9 10*3/uL — CL (ref 4.0–10.0)
lymph#: 0.4 10*3/uL — ABNORMAL LOW (ref 0.9–3.3)

## 2007-11-23 LAB — COMPREHENSIVE METABOLIC PANEL
ALT: 56 U/L — ABNORMAL HIGH (ref 0–53)
AST: 69 U/L — ABNORMAL HIGH (ref 0–37)
Albumin: 3.4 g/dL — ABNORMAL LOW (ref 3.5–5.2)
Alkaline Phosphatase: 112 U/L (ref 39–117)
BUN: 12 mg/dL (ref 6–23)
CO2: 29 mEq/L (ref 19–32)
Calcium: 9 mg/dL (ref 8.4–10.5)
Chloride: 102 mEq/L (ref 96–112)
Creatinine, Ser: 0.63 mg/dL (ref 0.40–1.50)
Glucose, Bld: 83 mg/dL (ref 70–99)
Potassium: 3.9 mEq/L (ref 3.5–5.3)
Sodium: 138 mEq/L (ref 135–145)
Total Bilirubin: 3.1 mg/dL — ABNORMAL HIGH (ref 0.3–1.2)
Total Protein: 5.9 g/dL — ABNORMAL LOW (ref 6.0–8.3)

## 2007-11-23 LAB — LACTATE DEHYDROGENASE: LDH: 139 U/L (ref 94–250)

## 2007-11-30 LAB — CBC WITH DIFFERENTIAL/PLATELET
BASO%: 0.2 % (ref 0.0–2.0)
Basophils Absolute: 0 10*3/uL (ref 0.0–0.1)
EOS%: 0 % (ref 0.0–7.0)
Eosinophils Absolute: 0 10*3/uL (ref 0.0–0.5)
HCT: 35.7 % — ABNORMAL LOW (ref 38.7–49.9)
HGB: 12 g/dL — ABNORMAL LOW (ref 13.0–17.1)
LYMPH%: 18.1 % (ref 14.0–48.0)
MCH: 28.9 pg (ref 28.0–33.4)
MCHC: 33.6 g/dL (ref 32.0–35.9)
MCV: 86 fL (ref 81.6–98.0)
MONO#: 0.6 10*3/uL (ref 0.1–0.9)
MONO%: 14.2 % — ABNORMAL HIGH (ref 0.0–13.0)
NEUT#: 2.7 10*3/uL (ref 1.5–6.5)
NEUT%: 67.5 % (ref 40.0–75.0)
Platelets: 176 10*3/uL (ref 145–400)
RBC: 4.15 10*6/uL — ABNORMAL LOW (ref 4.20–5.71)
RDW: 16.4 % — ABNORMAL HIGH (ref 11.2–14.6)
WBC: 4 10*3/uL (ref 4.0–10.0)
lymph#: 0.7 10*3/uL — ABNORMAL LOW (ref 0.9–3.3)

## 2007-11-30 LAB — LACTATE DEHYDROGENASE: LDH: 224 U/L (ref 94–250)

## 2007-11-30 LAB — COMPREHENSIVE METABOLIC PANEL
ALT: 55 U/L — ABNORMAL HIGH (ref 0–53)
AST: 89 U/L — ABNORMAL HIGH (ref 0–37)
Albumin: 3.9 g/dL (ref 3.5–5.2)
Alkaline Phosphatase: 120 U/L — ABNORMAL HIGH (ref 39–117)
BUN: 8 mg/dL (ref 6–23)
CO2: 25 mEq/L (ref 19–32)
Calcium: 9.2 mg/dL (ref 8.4–10.5)
Chloride: 106 mEq/L (ref 96–112)
Creatinine, Ser: 0.74 mg/dL (ref 0.40–1.50)
Glucose, Bld: 80 mg/dL (ref 70–99)
Potassium: 3.7 mEq/L (ref 3.5–5.3)
Sodium: 140 mEq/L (ref 135–145)
Total Bilirubin: 2.1 mg/dL — ABNORMAL HIGH (ref 0.3–1.2)
Total Protein: 6.8 g/dL (ref 6.0–8.3)

## 2007-12-10 ENCOUNTER — Ambulatory Visit: Payer: Self-pay | Admitting: Internal Medicine

## 2007-12-14 LAB — CBC WITH DIFFERENTIAL/PLATELET
HCT: 34.4 % — ABNORMAL LOW (ref 38.7–49.9)
HGB: 11.3 g/dL — ABNORMAL LOW (ref 13.0–17.1)
MCH: 28.4 pg (ref 28.0–33.4)
MCHC: 32.9 g/dL (ref 32.0–35.9)
MCV: 86.3 fL (ref 81.6–98.0)
Platelets: 54 10*3/uL — ABNORMAL LOW (ref 145–400)
RBC: 3.99 10*6/uL — ABNORMAL LOW (ref 4.20–5.71)
RDW: 15.3 % — ABNORMAL HIGH (ref 11.2–14.6)
WBC: 0.2 10*3/uL — CL (ref 4.0–10.0)

## 2007-12-14 LAB — COMPREHENSIVE METABOLIC PANEL
ALT: 90 U/L — ABNORMAL HIGH (ref 0–53)
AST: 212 U/L — ABNORMAL HIGH (ref 0–37)
Albumin: 3.3 g/dL — ABNORMAL LOW (ref 3.5–5.2)
Alkaline Phosphatase: 137 U/L — ABNORMAL HIGH (ref 39–117)
BUN: 13 mg/dL (ref 6–23)
CO2: 25 mEq/L (ref 19–32)
Calcium: 8.8 mg/dL (ref 8.4–10.5)
Chloride: 104 mEq/L (ref 96–112)
Creatinine, Ser: 0.73 mg/dL (ref 0.40–1.50)
Glucose, Bld: 117 mg/dL — ABNORMAL HIGH (ref 70–99)
Potassium: 4.1 mEq/L (ref 3.5–5.3)
Sodium: 137 mEq/L (ref 135–145)
Total Bilirubin: 16.9 mg/dL — ABNORMAL HIGH (ref 0.3–1.2)
Total Protein: 5.5 g/dL — ABNORMAL LOW (ref 6.0–8.3)

## 2007-12-14 LAB — TECHNOLOGIST REVIEW

## 2007-12-14 LAB — LACTATE DEHYDROGENASE: LDH: 212 U/L (ref 94–250)

## 2007-12-15 ENCOUNTER — Ambulatory Visit (HOSPITAL_COMMUNITY): Admission: RE | Admit: 2007-12-15 | Discharge: 2007-12-15 | Payer: Self-pay | Admitting: Internal Medicine

## 2007-12-21 ENCOUNTER — Ambulatory Visit (HOSPITAL_COMMUNITY): Admission: RE | Admit: 2007-12-21 | Discharge: 2007-12-21 | Payer: Self-pay | Admitting: Internal Medicine

## 2007-12-21 LAB — CBC WITH DIFFERENTIAL/PLATELET
BASO%: 0.2 % (ref 0.0–2.0)
Basophils Absolute: 0 10*3/uL (ref 0.0–0.1)
EOS%: 0 % (ref 0.0–7.0)
Eosinophils Absolute: 0 10*3/uL (ref 0.0–0.5)
HCT: 28.8 % — ABNORMAL LOW (ref 38.7–49.9)
HGB: 9.8 g/dL — ABNORMAL LOW (ref 13.0–17.1)
LYMPH%: 12.5 % — ABNORMAL LOW (ref 14.0–48.0)
MCH: 29.4 pg (ref 28.0–33.4)
MCHC: 34 g/dL (ref 32.0–35.9)
MCV: 86.5 fL (ref 81.6–98.0)
MONO#: 0.5 10*3/uL (ref 0.1–0.9)
MONO%: 20.8 % — ABNORMAL HIGH (ref 0.0–13.0)
NEUT#: 1.6 10*3/uL (ref 1.5–6.5)
NEUT%: 66.5 % (ref 40.0–75.0)
Platelets: 118 10*3/uL — ABNORMAL LOW (ref 145–400)
RBC: 3.33 10*6/uL — ABNORMAL LOW (ref 4.20–5.71)
RDW: 16.7 % — ABNORMAL HIGH (ref 11.2–14.6)
WBC: 2.4 10*3/uL — ABNORMAL LOW (ref 4.0–10.0)
lymph#: 0.3 10*3/uL — ABNORMAL LOW (ref 0.9–3.3)

## 2007-12-21 LAB — COMPREHENSIVE METABOLIC PANEL
ALT: 52 U/L (ref 0–53)
AST: 91 U/L — ABNORMAL HIGH (ref 0–37)
Albumin: 2.5 g/dL — ABNORMAL LOW (ref 3.5–5.2)
Alkaline Phosphatase: 134 U/L — ABNORMAL HIGH (ref 39–117)
BUN: 7 mg/dL (ref 6–23)
CO2: 26 mEq/L (ref 19–32)
Calcium: 8.6 mg/dL (ref 8.4–10.5)
Chloride: 106 mEq/L (ref 96–112)
Creatinine, Ser: 0.65 mg/dL (ref 0.40–1.50)
Glucose, Bld: 100 mg/dL — ABNORMAL HIGH (ref 70–99)
Potassium: 4.1 mEq/L (ref 3.5–5.3)
Sodium: 136 mEq/L (ref 135–145)
Total Bilirubin: 9 mg/dL — ABNORMAL HIGH (ref 0.3–1.2)
Total Protein: 5.2 g/dL — ABNORMAL LOW (ref 6.0–8.3)

## 2007-12-21 LAB — LACTATE DEHYDROGENASE: LDH: 173 U/L (ref 94–250)

## 2007-12-28 LAB — CBC WITH DIFFERENTIAL/PLATELET
BASO%: 0.6 % (ref 0.0–2.0)
Basophils Absolute: 0 10*3/uL (ref 0.0–0.1)
EOS%: 0 % (ref 0.0–7.0)
Eosinophils Absolute: 0 10*3/uL (ref 0.0–0.5)
HCT: 29.7 % — ABNORMAL LOW (ref 38.7–49.9)
HGB: 10 g/dL — ABNORMAL LOW (ref 13.0–17.1)
LYMPH%: 12.5 % — ABNORMAL LOW (ref 14.0–48.0)
MCH: 29.6 pg (ref 28.0–33.4)
MCHC: 33.5 g/dL (ref 32.0–35.9)
MCV: 88.3 fL (ref 81.6–98.0)
MONO#: 0.5 10*3/uL (ref 0.1–0.9)
MONO%: 15.5 % — ABNORMAL HIGH (ref 0.0–13.0)
NEUT#: 2.4 10*3/uL (ref 1.5–6.5)
NEUT%: 71.4 % (ref 40.0–75.0)
Platelets: 204 10*3/uL (ref 145–400)
RBC: 3.37 10*6/uL — ABNORMAL LOW (ref 4.20–5.71)
RDW: 18.4 % — ABNORMAL HIGH (ref 11.2–14.6)
WBC: 3.3 10*3/uL — ABNORMAL LOW (ref 4.0–10.0)
lymph#: 0.4 10*3/uL — ABNORMAL LOW (ref 0.9–3.3)

## 2007-12-28 LAB — COMPREHENSIVE METABOLIC PANEL
ALT: 31 U/L (ref 0–53)
AST: 53 U/L — ABNORMAL HIGH (ref 0–37)
Albumin: 3.1 g/dL — ABNORMAL LOW (ref 3.5–5.2)
Alkaline Phosphatase: 114 U/L (ref 39–117)
BUN: 9 mg/dL (ref 6–23)
CO2: 20 mEq/L (ref 19–32)
Calcium: 8.8 mg/dL (ref 8.4–10.5)
Chloride: 108 mEq/L (ref 96–112)
Creatinine, Ser: 0.56 mg/dL (ref 0.40–1.50)
Glucose, Bld: 135 mg/dL — ABNORMAL HIGH (ref 70–99)
Potassium: 4 mEq/L (ref 3.5–5.3)
Sodium: 137 mEq/L (ref 135–145)
Total Bilirubin: 5.2 mg/dL — ABNORMAL HIGH (ref 0.3–1.2)
Total Protein: 5.7 g/dL — ABNORMAL LOW (ref 6.0–8.3)

## 2007-12-28 LAB — LACTATE DEHYDROGENASE: LDH: 215 U/L (ref 94–250)

## 2008-01-04 LAB — CBC WITH DIFFERENTIAL/PLATELET
BASO%: 1 % (ref 0.0–2.0)
Basophils Absolute: 0.1 10*3/uL (ref 0.0–0.1)
EOS%: 1.4 % (ref 0.0–7.0)
Eosinophils Absolute: 0.1 10*3/uL (ref 0.0–0.5)
HCT: 31.8 % — ABNORMAL LOW (ref 38.7–49.9)
HGB: 10.7 g/dL — ABNORMAL LOW (ref 13.0–17.1)
LYMPH%: 15.9 % (ref 14.0–48.0)
MCH: 29.3 pg (ref 28.0–33.4)
MCHC: 33.6 g/dL (ref 32.0–35.9)
MCV: 87.1 fL (ref 81.6–98.0)
MONO#: 1.4 10*3/uL — ABNORMAL HIGH (ref 0.1–0.9)
MONO%: 22 % — ABNORMAL HIGH (ref 0.0–13.0)
NEUT#: 3.8 10*3/uL (ref 1.5–6.5)
NEUT%: 59.7 % (ref 40.0–75.0)
Platelets: 185 10*3/uL (ref 145–400)
RBC: 3.65 10*6/uL — ABNORMAL LOW (ref 4.20–5.71)
RDW: 17.4 % — ABNORMAL HIGH (ref 11.2–14.6)
WBC: 6.3 10*3/uL (ref 4.0–10.0)
lymph#: 1 10*3/uL (ref 0.9–3.3)

## 2008-01-04 LAB — COMPREHENSIVE METABOLIC PANEL
ALT: 23 U/L (ref 0–53)
AST: 47 U/L — ABNORMAL HIGH (ref 0–37)
Albumin: 2.7 g/dL — ABNORMAL LOW (ref 3.5–5.2)
Alkaline Phosphatase: 95 U/L (ref 39–117)
BUN: 9 mg/dL (ref 6–23)
CO2: 20 mEq/L (ref 19–32)
Calcium: 8.2 mg/dL — ABNORMAL LOW (ref 8.4–10.5)
Chloride: 107 mEq/L (ref 96–112)
Creatinine, Ser: 0.72 mg/dL (ref 0.40–1.50)
Glucose, Bld: 106 mg/dL — ABNORMAL HIGH (ref 70–99)
Potassium: 4.1 mEq/L (ref 3.5–5.3)
Sodium: 138 mEq/L (ref 135–145)
Total Bilirubin: 3.6 mg/dL — ABNORMAL HIGH (ref 0.3–1.2)
Total Protein: 5.3 g/dL — ABNORMAL LOW (ref 6.0–8.3)

## 2008-01-08 ENCOUNTER — Ambulatory Visit (HOSPITAL_COMMUNITY): Admission: RE | Admit: 2008-01-08 | Discharge: 2008-01-08 | Payer: Self-pay | Admitting: Internal Medicine

## 2008-01-08 LAB — COMPREHENSIVE METABOLIC PANEL
ALT: 26 U/L (ref 0–53)
AST: 50 U/L — ABNORMAL HIGH (ref 0–37)
Albumin: 2.4 g/dL — ABNORMAL LOW (ref 3.5–5.2)
Alkaline Phosphatase: 100 U/L (ref 39–117)
BUN: 6 mg/dL (ref 6–23)
CO2: 25 mEq/L (ref 19–32)
Calcium: 8.5 mg/dL (ref 8.4–10.5)
Chloride: 108 mEq/L (ref 96–112)
Creatinine, Ser: 0.65 mg/dL (ref 0.40–1.50)
Glucose, Bld: 100 mg/dL — ABNORMAL HIGH (ref 70–99)
Potassium: 3.8 mEq/L (ref 3.5–5.3)
Sodium: 140 mEq/L (ref 135–145)
Total Bilirubin: 3.1 mg/dL — ABNORMAL HIGH (ref 0.3–1.2)
Total Protein: 5.6 g/dL — ABNORMAL LOW (ref 6.0–8.3)

## 2008-01-08 LAB — URINALYSIS, MICROSCOPIC - CHCC
Bilirubin (Urine): NEGATIVE
Blood: NEGATIVE
Glucose: NEGATIVE g/dL
Ketones: NEGATIVE mg/dL
Leukocyte Esterase: NEGATIVE
Nitrite: NEGATIVE
Protein: NEGATIVE mg/dL
Specific Gravity, Urine: 1.015 (ref 1.003–1.035)
WBC, UA: NEGATIVE (ref 0–2)
pH: 5 (ref 4.6–8.0)

## 2008-01-08 LAB — CBC WITH DIFFERENTIAL/PLATELET
BASO%: 1.1 % (ref 0.0–2.0)
Basophils Absolute: 0.1 10*3/uL (ref 0.0–0.1)
EOS%: 3.6 % (ref 0.0–7.0)
Eosinophils Absolute: 0.2 10*3/uL (ref 0.0–0.5)
HCT: 36 % — ABNORMAL LOW (ref 38.7–49.9)
HGB: 11.8 g/dL — ABNORMAL LOW (ref 13.0–17.1)
LYMPH%: 14.1 % (ref 14.0–48.0)
MCH: 28.5 pg (ref 28.0–33.4)
MCHC: 32.8 g/dL (ref 32.0–35.9)
MCV: 87 fL (ref 81.6–98.0)
MONO#: 1 10*3/uL — ABNORMAL HIGH (ref 0.1–0.9)
MONO%: 16.1 % — ABNORMAL HIGH (ref 0.0–13.0)
NEUT#: 4.1 10*3/uL (ref 1.5–6.5)
NEUT%: 65 % (ref 40.0–75.0)
Platelets: 181 10*3/uL (ref 145–400)
RBC: 4.13 10*6/uL — ABNORMAL LOW (ref 4.20–5.71)
RDW: 16.3 % — ABNORMAL HIGH (ref 11.2–14.6)
WBC: 6.4 10*3/uL (ref 4.0–10.0)
lymph#: 0.9 10*3/uL (ref 0.9–3.3)

## 2008-01-10 LAB — URINE CULTURE

## 2008-01-14 LAB — CULTURE, BLOOD (SINGLE)

## 2008-01-15 LAB — COMPREHENSIVE METABOLIC PANEL
ALT: 34 U/L (ref 0–53)
AST: 64 U/L — ABNORMAL HIGH (ref 0–37)
Albumin: 2.7 g/dL — ABNORMAL LOW (ref 3.5–5.2)
Alkaline Phosphatase: 117 U/L (ref 39–117)
BUN: 7 mg/dL (ref 6–23)
CO2: 26 mEq/L (ref 19–32)
Calcium: 8.9 mg/dL (ref 8.4–10.5)
Chloride: 107 mEq/L (ref 96–112)
Creatinine, Ser: 0.63 mg/dL (ref 0.40–1.50)
Glucose, Bld: 128 mg/dL — ABNORMAL HIGH (ref 70–99)
Potassium: 4.1 mEq/L (ref 3.5–5.3)
Sodium: 140 mEq/L (ref 135–145)
Total Bilirubin: 2 mg/dL — ABNORMAL HIGH (ref 0.3–1.2)
Total Protein: 6.1 g/dL (ref 6.0–8.3)

## 2008-01-15 LAB — LACTATE DEHYDROGENASE: LDH: 190 U/L (ref 94–250)

## 2008-01-15 LAB — CBC WITH DIFFERENTIAL/PLATELET
BASO%: 1.6 % (ref 0.0–2.0)
Basophils Absolute: 0.1 10*3/uL (ref 0.0–0.1)
EOS%: 4.6 % (ref 0.0–7.0)
Eosinophils Absolute: 0.2 10*3/uL (ref 0.0–0.5)
HCT: 34.1 % — ABNORMAL LOW (ref 38.7–49.9)
HGB: 11.2 g/dL — ABNORMAL LOW (ref 13.0–17.1)
LYMPH%: 21.4 % (ref 14.0–48.0)
MCH: 28.5 pg (ref 28.0–33.4)
MCHC: 33 g/dL (ref 32.0–35.9)
MCV: 86.4 fL (ref 81.6–98.0)
MONO#: 0.8 10*3/uL (ref 0.1–0.9)
MONO%: 18.8 % — ABNORMAL HIGH (ref 0.0–13.0)
NEUT#: 2.3 10*3/uL (ref 1.5–6.5)
NEUT%: 53.6 % (ref 40.0–75.0)
Platelets: 135 10*3/uL — ABNORMAL LOW (ref 145–400)
RBC: 3.94 10*6/uL — ABNORMAL LOW (ref 4.20–5.71)
RDW: 15.2 % — ABNORMAL HIGH (ref 11.2–14.6)
WBC: 4.3 10*3/uL (ref 4.0–10.0)
lymph#: 0.9 10*3/uL (ref 0.9–3.3)

## 2008-01-26 DIAGNOSIS — C8590 Non-Hodgkin lymphoma, unspecified, unspecified site: Secondary | ICD-10-CM | POA: Insufficient documentation

## 2008-02-01 ENCOUNTER — Ambulatory Visit: Payer: Self-pay | Admitting: Internal Medicine

## 2008-02-05 LAB — CBC WITH DIFFERENTIAL/PLATELET
BASO%: 1.9 % (ref 0.0–2.0)
Basophils Absolute: 0.1 10*3/uL (ref 0.0–0.1)
EOS%: 0.4 % (ref 0.0–7.0)
Eosinophils Absolute: 0 10*3/uL (ref 0.0–0.5)
HCT: 32.8 % — ABNORMAL LOW (ref 38.7–49.9)
HGB: 10.5 g/dL — ABNORMAL LOW (ref 13.0–17.1)
LYMPH%: 25.4 % (ref 14.0–48.0)
MCH: 27.9 pg — ABNORMAL LOW (ref 28.0–33.4)
MCHC: 32 g/dL (ref 32.0–35.9)
MCV: 87.4 fL (ref 81.6–98.0)
MONO#: 0.8 10*3/uL (ref 0.1–0.9)
MONO%: 27.2 % — ABNORMAL HIGH (ref 0.0–13.0)
NEUT#: 1.3 10*3/uL — ABNORMAL LOW (ref 1.5–6.5)
NEUT%: 45.1 % (ref 40.0–75.0)
Platelets: 127 10*3/uL — ABNORMAL LOW (ref 145–400)
RBC: 3.75 10*6/uL — ABNORMAL LOW (ref 4.20–5.71)
RDW: 17.2 % — ABNORMAL HIGH (ref 11.2–14.6)
WBC: 2.9 10*3/uL — ABNORMAL LOW (ref 4.0–10.0)
lymph#: 0.7 10*3/uL — ABNORMAL LOW (ref 0.9–3.3)

## 2008-02-05 LAB — COMPREHENSIVE METABOLIC PANEL
ALT: 33 U/L (ref 0–53)
AST: 83 U/L — ABNORMAL HIGH (ref 0–37)
Albumin: 2.6 g/dL — ABNORMAL LOW (ref 3.5–5.2)
Alkaline Phosphatase: 139 U/L — ABNORMAL HIGH (ref 39–117)
BUN: 6 mg/dL (ref 6–23)
CO2: 25 mEq/L (ref 19–32)
Calcium: 8.9 mg/dL (ref 8.4–10.5)
Chloride: 110 mEq/L (ref 96–112)
Creatinine, Ser: 0.53 mg/dL (ref 0.40–1.50)
Glucose, Bld: 119 mg/dL — ABNORMAL HIGH (ref 70–99)
Potassium: 3.8 mEq/L (ref 3.5–5.3)
Sodium: 142 mEq/L (ref 135–145)
Total Bilirubin: 2.7 mg/dL — ABNORMAL HIGH (ref 0.3–1.2)
Total Protein: 5.6 g/dL — ABNORMAL LOW (ref 6.0–8.3)

## 2008-02-05 LAB — LACTATE DEHYDROGENASE: LDH: 174 U/L (ref 94–250)

## 2008-02-12 LAB — COMPREHENSIVE METABOLIC PANEL
ALT: 38 U/L (ref 0–53)
AST: 102 U/L — ABNORMAL HIGH (ref 0–37)
Albumin: 2.9 g/dL — ABNORMAL LOW (ref 3.5–5.2)
Alkaline Phosphatase: 152 U/L — ABNORMAL HIGH (ref 39–117)
BUN: 8 mg/dL (ref 6–23)
CO2: 29 mEq/L (ref 19–32)
Calcium: 8.9 mg/dL (ref 8.4–10.5)
Chloride: 106 mEq/L (ref 96–112)
Creatinine, Ser: 0.65 mg/dL (ref 0.40–1.50)
Glucose, Bld: 125 mg/dL — ABNORMAL HIGH (ref 70–99)
Potassium: 3.5 mEq/L (ref 3.5–5.3)
Sodium: 141 mEq/L (ref 135–145)
Total Bilirubin: 3.2 mg/dL — ABNORMAL HIGH (ref 0.3–1.2)
Total Protein: 5.9 g/dL — ABNORMAL LOW (ref 6.0–8.3)

## 2008-02-12 LAB — CBC WITH DIFFERENTIAL/PLATELET
BASO%: 1.9 % (ref 0.0–2.0)
Basophils Absolute: 0.1 10*3/uL (ref 0.0–0.1)
EOS%: 0.2 % (ref 0.0–7.0)
Eosinophils Absolute: 0 10*3/uL (ref 0.0–0.5)
HCT: 34.7 % — ABNORMAL LOW (ref 38.7–49.9)
HGB: 11.3 g/dL — ABNORMAL LOW (ref 13.0–17.1)
LYMPH%: 27 % (ref 14.0–48.0)
MCH: 28.7 pg (ref 28.0–33.4)
MCHC: 32.7 g/dL (ref 32.0–35.9)
MCV: 87.9 fL (ref 81.6–98.0)
MONO#: 0.8 10*3/uL (ref 0.1–0.9)
MONO%: 28.7 % — ABNORMAL HIGH (ref 0.0–13.0)
NEUT#: 1.1 10*3/uL — ABNORMAL LOW (ref 1.5–6.5)
NEUT%: 42.2 % (ref 40.0–75.0)
Platelets: 140 10*3/uL — ABNORMAL LOW (ref 145–400)
RBC: 3.95 10*6/uL — ABNORMAL LOW (ref 4.20–5.71)
RDW: 17.1 % — ABNORMAL HIGH (ref 11.2–14.6)
WBC: 2.7 10*3/uL — ABNORMAL LOW (ref 4.0–10.0)
lymph#: 0.7 10*3/uL — ABNORMAL LOW (ref 0.9–3.3)

## 2008-02-12 LAB — LACTATE DEHYDROGENASE: LDH: 201 U/L (ref 94–250)

## 2008-02-19 LAB — CBC WITH DIFFERENTIAL/PLATELET
BASO%: 2.3 % — ABNORMAL HIGH (ref 0.0–2.0)
Basophils Absolute: 0.1 10*3/uL (ref 0.0–0.1)
EOS%: 1.2 % (ref 0.0–7.0)
Eosinophils Absolute: 0 10*3/uL (ref 0.0–0.5)
HCT: 36.7 % — ABNORMAL LOW (ref 38.7–49.9)
HGB: 12.2 g/dL — ABNORMAL LOW (ref 13.0–17.1)
LYMPH%: 31.2 % (ref 14.0–48.0)
MCH: 28.5 pg (ref 28.0–33.4)
MCHC: 33.3 g/dL (ref 32.0–35.9)
MCV: 85.6 fL (ref 81.6–98.0)
MONO#: 1 10*3/uL — ABNORMAL HIGH (ref 0.1–0.9)
MONO%: 42.9 % — ABNORMAL HIGH (ref 0.0–13.0)
NEUT#: 0.5 10*3/uL — ABNORMAL LOW (ref 1.5–6.5)
NEUT%: 22.5 % — ABNORMAL LOW (ref 40.0–75.0)
Platelets: 120 10*3/uL — ABNORMAL LOW (ref 145–400)
RBC: 4.29 10*6/uL (ref 4.20–5.71)
RDW: 15.9 % — ABNORMAL HIGH (ref 11.2–14.6)
WBC: 2.4 10*3/uL — ABNORMAL LOW (ref 4.0–10.0)
lymph#: 0.7 10*3/uL — ABNORMAL LOW (ref 0.9–3.3)

## 2008-02-19 LAB — COMPREHENSIVE METABOLIC PANEL
ALT: 40 U/L (ref 0–53)
AST: 113 U/L — ABNORMAL HIGH (ref 0–37)
Albumin: 2.6 g/dL — ABNORMAL LOW (ref 3.5–5.2)
Alkaline Phosphatase: 152 U/L — ABNORMAL HIGH (ref 39–117)
BUN: 7 mg/dL (ref 6–23)
CO2: 25 mEq/L (ref 19–32)
Calcium: 8.4 mg/dL (ref 8.4–10.5)
Chloride: 106 mEq/L (ref 96–112)
Creatinine, Ser: 0.73 mg/dL (ref 0.40–1.50)
Glucose, Bld: 118 mg/dL — ABNORMAL HIGH (ref 70–99)
Potassium: 3.6 mEq/L (ref 3.5–5.3)
Sodium: 137 mEq/L (ref 135–145)
Total Bilirubin: 4.2 mg/dL — ABNORMAL HIGH (ref 0.3–1.2)
Total Protein: 5.6 g/dL — ABNORMAL LOW (ref 6.0–8.3)

## 2008-02-22 LAB — CBC WITH DIFFERENTIAL/PLATELET
BASO%: 0 % (ref 0.0–2.0)
Basophils Absolute: 0 10*3/uL (ref 0.0–0.1)
EOS%: 0.8 % (ref 0.0–7.0)
Eosinophils Absolute: 0.1 10*3/uL (ref 0.0–0.5)
HCT: 38.2 % — ABNORMAL LOW (ref 38.7–49.9)
HGB: 12.7 g/dL — ABNORMAL LOW (ref 13.0–17.1)
LYMPH%: 7.8 % — ABNORMAL LOW (ref 14.0–48.0)
MCH: 29.6 pg (ref 28.0–33.4)
MCHC: 33.3 g/dL (ref 32.0–35.9)
MCV: 88.9 fL (ref 81.6–98.0)
MONO#: 0.7 10*3/uL (ref 0.1–0.9)
MONO%: 6.4 % (ref 0.0–13.0)
NEUT#: 9.6 10*3/uL — ABNORMAL HIGH (ref 1.5–6.5)
NEUT%: 85 % — ABNORMAL HIGH (ref 40.0–75.0)
Platelets: 119 10*3/uL — ABNORMAL LOW (ref 145–400)
RBC: 4.29 10*6/uL (ref 4.20–5.71)
RDW: 18.5 % — ABNORMAL HIGH (ref 11.2–14.6)
WBC: 11.2 10*3/uL — ABNORMAL HIGH (ref 4.0–10.0)
lymph#: 0.9 10*3/uL (ref 0.9–3.3)

## 2008-02-29 ENCOUNTER — Ambulatory Visit (HOSPITAL_COMMUNITY): Admission: RE | Admit: 2008-02-29 | Discharge: 2008-02-29 | Payer: Self-pay | Admitting: Internal Medicine

## 2008-03-05 LAB — CBC WITH DIFFERENTIAL/PLATELET
BASO%: 0.3 % (ref 0.0–2.0)
Basophils Absolute: 0 10*3/uL (ref 0.0–0.1)
EOS%: 3.3 % (ref 0.0–7.0)
Eosinophils Absolute: 0.1 10*3/uL (ref 0.0–0.5)
HCT: 37.4 % — ABNORMAL LOW (ref 38.7–49.9)
HGB: 12.6 g/dL — ABNORMAL LOW (ref 13.0–17.1)
LYMPH%: 34.9 % (ref 14.0–48.0)
MCH: 29.4 pg (ref 28.0–33.4)
MCHC: 33.7 g/dL (ref 32.0–35.9)
MCV: 87.1 fL (ref 81.6–98.0)
MONO#: 0.4 10*3/uL (ref 0.1–0.9)
MONO%: 19.1 % — ABNORMAL HIGH (ref 0.0–13.0)
NEUT#: 0.9 10*3/uL — ABNORMAL LOW (ref 1.5–6.5)
NEUT%: 42.4 % (ref 40.0–75.0)
Platelets: 142 10*3/uL — ABNORMAL LOW (ref 145–400)
RBC: 4.3 10*6/uL (ref 4.20–5.71)
RDW: 16 % — ABNORMAL HIGH (ref 11.2–14.6)
WBC: 2.2 10*3/uL — ABNORMAL LOW (ref 4.0–10.0)
lymph#: 0.8 10*3/uL — ABNORMAL LOW (ref 0.9–3.3)

## 2008-03-05 LAB — COMPREHENSIVE METABOLIC PANEL
ALT: 34 U/L (ref 0–53)
AST: 96 U/L — ABNORMAL HIGH (ref 0–37)
Albumin: 2.9 g/dL — ABNORMAL LOW (ref 3.5–5.2)
Alkaline Phosphatase: 172 U/L — ABNORMAL HIGH (ref 39–117)
BUN: 7 mg/dL (ref 6–23)
CO2: 22 mEq/L (ref 19–32)
Calcium: 8.7 mg/dL (ref 8.4–10.5)
Chloride: 112 mEq/L (ref 96–112)
Creatinine, Ser: 0.68 mg/dL (ref 0.40–1.50)
Glucose, Bld: 146 mg/dL — ABNORMAL HIGH (ref 70–99)
Potassium: 3.5 mEq/L (ref 3.5–5.3)
Sodium: 144 mEq/L (ref 135–145)
Total Bilirubin: 4.1 mg/dL — ABNORMAL HIGH (ref 0.3–1.2)
Total Protein: 5.8 g/dL — ABNORMAL LOW (ref 6.0–8.3)

## 2008-03-05 LAB — LACTATE DEHYDROGENASE: LDH: 164 U/L (ref 94–250)

## 2008-03-18 ENCOUNTER — Emergency Department (HOSPITAL_COMMUNITY): Admission: EM | Admit: 2008-03-18 | Discharge: 2008-03-18 | Payer: Self-pay | Admitting: Emergency Medicine

## 2008-03-19 ENCOUNTER — Ambulatory Visit: Payer: Self-pay | Admitting: Internal Medicine

## 2008-03-19 LAB — COMPREHENSIVE METABOLIC PANEL
ALT: 48 U/L (ref 0–53)
AST: 128 U/L — ABNORMAL HIGH (ref 0–37)
Albumin: 2.7 g/dL — ABNORMAL LOW (ref 3.5–5.2)
Alkaline Phosphatase: 146 U/L — ABNORMAL HIGH (ref 39–117)
BUN: 8 mg/dL (ref 6–23)
CO2: 25 mEq/L (ref 19–32)
Calcium: 8.8 mg/dL (ref 8.4–10.5)
Chloride: 107 mEq/L (ref 96–112)
Creatinine, Ser: 0.77 mg/dL (ref 0.40–1.50)
Glucose, Bld: 126 mg/dL — ABNORMAL HIGH (ref 70–99)
Potassium: 3.6 mEq/L (ref 3.5–5.3)
Sodium: 138 mEq/L (ref 135–145)
Total Bilirubin: 4.6 mg/dL — ABNORMAL HIGH (ref 0.3–1.2)
Total Protein: 5.9 g/dL — ABNORMAL LOW (ref 6.0–8.3)

## 2008-03-19 LAB — CBC WITH DIFFERENTIAL/PLATELET
BASO%: 0.5 % (ref 0.0–2.0)
Basophils Absolute: 0 10*3/uL (ref 0.0–0.1)
EOS%: 1.3 % (ref 0.0–7.0)
Eosinophils Absolute: 0 10*3/uL (ref 0.0–0.5)
HCT: 36.2 % — ABNORMAL LOW (ref 38.7–49.9)
HGB: 12.2 g/dL — ABNORMAL LOW (ref 13.0–17.1)
LYMPH%: 28.3 % (ref 14.0–48.0)
MCH: 29.3 pg (ref 28.0–33.4)
MCHC: 33.8 g/dL (ref 32.0–35.9)
MCV: 86.7 fL (ref 81.6–98.0)
MONO#: 0.5 10*3/uL (ref 0.1–0.9)
MONO%: 17.6 % — ABNORMAL HIGH (ref 0.0–13.0)
NEUT#: 1.6 10*3/uL (ref 1.5–6.5)
NEUT%: 52.3 % (ref 40.0–75.0)
Platelets: 133 10*3/uL — ABNORMAL LOW (ref 145–400)
RBC: 4.17 10*6/uL — ABNORMAL LOW (ref 4.20–5.71)
RDW: 15 % — ABNORMAL HIGH (ref 11.2–14.6)
WBC: 3 10*3/uL — ABNORMAL LOW (ref 4.0–10.0)
lymph#: 0.9 10*3/uL (ref 0.9–3.3)

## 2008-05-01 ENCOUNTER — Ambulatory Visit (HOSPITAL_COMMUNITY): Admission: RE | Admit: 2008-05-01 | Discharge: 2008-05-01 | Payer: Self-pay | Admitting: Internal Medicine

## 2008-05-01 LAB — COMPREHENSIVE METABOLIC PANEL WITH GFR
ALT: 38 U/L (ref 0–53)
AST: 94 U/L — ABNORMAL HIGH (ref 0–37)
Albumin: 3 g/dL — ABNORMAL LOW (ref 3.5–5.2)
Alkaline Phosphatase: 149 U/L — ABNORMAL HIGH (ref 39–117)
BUN: 9 mg/dL (ref 6–23)
CO2: 26 meq/L (ref 19–32)
Calcium: 8.5 mg/dL (ref 8.4–10.5)
Chloride: 103 meq/L (ref 96–112)
Creatinine, Ser: 0.91 mg/dL (ref 0.40–1.50)
Glucose, Bld: 141 mg/dL — ABNORMAL HIGH (ref 70–99)
Potassium: 3.5 meq/L (ref 3.5–5.3)
Sodium: 138 meq/L (ref 135–145)
Total Bilirubin: 3.6 mg/dL — ABNORMAL HIGH (ref 0.3–1.2)
Total Protein: 6.5 g/dL (ref 6.0–8.3)

## 2008-05-01 LAB — CBC WITH DIFFERENTIAL/PLATELET
BASO%: 0.1 % (ref 0.0–2.0)
Basophils Absolute: 0 10*3/uL (ref 0.0–0.1)
EOS%: 0.9 % (ref 0.0–7.0)
Eosinophils Absolute: 0 10*3/uL (ref 0.0–0.5)
HCT: 35.9 % — ABNORMAL LOW (ref 38.7–49.9)
HGB: 12.3 g/dL — ABNORMAL LOW (ref 13.0–17.1)
LYMPH%: 25.7 % (ref 14.0–48.0)
MCH: 29.7 pg (ref 28.0–33.4)
MCHC: 34.1 g/dL (ref 32.0–35.9)
MCV: 86.9 fL (ref 81.6–98.0)
MONO#: 0.6 10*3/uL (ref 0.1–0.9)
MONO%: 23 % — ABNORMAL HIGH (ref 0.0–13.0)
NEUT#: 1.2 10*3/uL — ABNORMAL LOW (ref 1.5–6.5)
NEUT%: 50.3 % (ref 40.0–75.0)
Platelets: 146 10*3/uL (ref 145–400)
RBC: 4.13 10*6/uL — ABNORMAL LOW (ref 4.20–5.71)
RDW: 13.9 % (ref 11.2–14.6)
WBC: 2.5 10*3/uL — ABNORMAL LOW (ref 4.0–10.0)
lymph#: 0.6 10*3/uL — ABNORMAL LOW (ref 0.9–3.3)

## 2008-05-06 ENCOUNTER — Encounter (INDEPENDENT_AMBULATORY_CARE_PROVIDER_SITE_OTHER): Payer: Self-pay | Admitting: *Deleted

## 2008-05-06 ENCOUNTER — Ambulatory Visit: Payer: Self-pay | Admitting: Internal Medicine

## 2008-06-06 ENCOUNTER — Encounter (INDEPENDENT_AMBULATORY_CARE_PROVIDER_SITE_OTHER): Payer: Self-pay | Admitting: *Deleted

## 2008-06-06 ENCOUNTER — Ambulatory Visit: Payer: Self-pay | Admitting: *Deleted

## 2008-06-06 DIAGNOSIS — N390 Urinary tract infection, site not specified: Secondary | ICD-10-CM | POA: Insufficient documentation

## 2008-06-06 DIAGNOSIS — R609 Edema, unspecified: Secondary | ICD-10-CM | POA: Insufficient documentation

## 2008-06-06 DIAGNOSIS — D61818 Other pancytopenia: Secondary | ICD-10-CM | POA: Insufficient documentation

## 2008-06-06 DIAGNOSIS — K56609 Unspecified intestinal obstruction, unspecified as to partial versus complete obstruction: Secondary | ICD-10-CM | POA: Insufficient documentation

## 2008-06-12 LAB — CONVERTED CEMR LAB
ALT: 43 units/L (ref 0–53)
AST: 91 units/L — ABNORMAL HIGH (ref 0–37)
Albumin: 3.6 g/dL (ref 3.5–5.2)
Alkaline Phosphatase: 148 units/L — ABNORMAL HIGH (ref 39–117)
BUN: 10 mg/dL (ref 6–23)
Basophils Absolute: 0 10*3/uL (ref 0.0–0.1)
Basophils Relative: 0 % (ref 0–1)
CO2: 22 meq/L (ref 19–32)
Calcium: 9.1 mg/dL (ref 8.4–10.5)
Chloride: 107 meq/L (ref 96–112)
Creatinine, Ser: 0.66 mg/dL (ref 0.40–1.50)
Eosinophils Absolute: 0.1 10*3/uL (ref 0.0–0.7)
Eosinophils Relative: 3 % (ref 0–5)
Glucose, Bld: 98 mg/dL (ref 70–99)
HCT: 39.8 % (ref 39.0–52.0)
Hemoglobin: 12.6 g/dL — ABNORMAL LOW (ref 13.0–17.0)
Lymphocytes Relative: 25 % (ref 12–46)
Lymphs Abs: 0.7 10*3/uL (ref 0.7–4.0)
MCHC: 31.7 g/dL (ref 30.0–36.0)
MCV: 90 fL (ref 78.0–100.0)
Monocytes Absolute: 0.8 10*3/uL (ref 0.1–1.0)
Monocytes Relative: 25 % — ABNORMAL HIGH (ref 3–12)
Neutro Abs: 1.4 10*3/uL — ABNORMAL LOW (ref 1.7–7.7)
Neutrophils Relative %: 47 % (ref 43–77)
Platelets: 168 10*3/uL (ref 150–400)
Potassium: 4.1 meq/L (ref 3.5–5.3)
RBC: 4.42 M/uL (ref 4.22–5.81)
RDW: 13.7 % (ref 11.5–15.5)
Sodium: 142 meq/L (ref 135–145)
Total Bilirubin: 3.2 mg/dL — ABNORMAL HIGH (ref 0.3–1.2)
Total Protein: 6.9 g/dL (ref 6.0–8.3)
WBC: 3 10*3/uL — ABNORMAL LOW (ref 4.0–10.5)

## 2008-06-13 ENCOUNTER — Telehealth (INDEPENDENT_AMBULATORY_CARE_PROVIDER_SITE_OTHER): Payer: Self-pay | Admitting: *Deleted

## 2008-06-14 ENCOUNTER — Encounter (INDEPENDENT_AMBULATORY_CARE_PROVIDER_SITE_OTHER): Payer: Self-pay | Admitting: *Deleted

## 2008-06-14 ENCOUNTER — Ambulatory Visit: Payer: Self-pay | Admitting: *Deleted

## 2008-06-14 LAB — CONVERTED CEMR LAB
ALT: 42 units/L (ref 0–53)
AST: 82 units/L — ABNORMAL HIGH (ref 0–37)
Albumin: 3.7 g/dL (ref 3.5–5.2)
Alkaline Phosphatase: 142 units/L — ABNORMAL HIGH (ref 39–117)
BUN: 13 mg/dL (ref 6–23)
CO2: 24 meq/L (ref 19–32)
Calcium: 9.2 mg/dL (ref 8.4–10.5)
Chloride: 107 meq/L (ref 96–112)
Creatinine, Ser: 0.76 mg/dL (ref 0.40–1.50)
Glucose, Bld: 128 mg/dL — ABNORMAL HIGH (ref 70–99)
Potassium: 4.2 meq/L (ref 3.5–5.3)
Sodium: 144 meq/L (ref 135–145)
Total Bilirubin: 2.6 mg/dL — ABNORMAL HIGH (ref 0.3–1.2)
Total Protein: 6.6 g/dL (ref 6.0–8.3)

## 2008-06-26 ENCOUNTER — Encounter (INDEPENDENT_AMBULATORY_CARE_PROVIDER_SITE_OTHER): Payer: Self-pay | Admitting: *Deleted

## 2008-06-26 ENCOUNTER — Ambulatory Visit (HOSPITAL_COMMUNITY): Admission: RE | Admit: 2008-06-26 | Discharge: 2008-06-26 | Payer: Self-pay | Admitting: *Deleted

## 2008-06-26 ENCOUNTER — Ambulatory Visit: Payer: Self-pay | Admitting: Internal Medicine

## 2008-07-09 ENCOUNTER — Encounter (INDEPENDENT_AMBULATORY_CARE_PROVIDER_SITE_OTHER): Payer: Self-pay | Admitting: Internal Medicine

## 2008-07-09 ENCOUNTER — Ambulatory Visit: Payer: Self-pay | Admitting: Internal Medicine

## 2008-07-09 DIAGNOSIS — L989 Disorder of the skin and subcutaneous tissue, unspecified: Secondary | ICD-10-CM | POA: Insufficient documentation

## 2008-07-09 LAB — CONVERTED CEMR LAB
ALT: 63 units/L — ABNORMAL HIGH (ref 0–53)
AST: 149 units/L — ABNORMAL HIGH (ref 0–37)
Albumin: 3.6 g/dL (ref 3.5–5.2)
Alkaline Phosphatase: 180 units/L — ABNORMAL HIGH (ref 39–117)
BUN: 9 mg/dL (ref 6–23)
Basophils Absolute: 0 10*3/uL (ref 0.0–0.1)
Basophils Relative: 1 % (ref 0–1)
CO2: 28 meq/L (ref 19–32)
Calcium: 9.5 mg/dL (ref 8.4–10.5)
Chloride: 103 meq/L (ref 96–112)
Creatinine, Ser: 0.84 mg/dL (ref 0.40–1.50)
Eosinophils Absolute: 0.2 10*3/uL (ref 0.0–0.7)
Eosinophils Relative: 4 % (ref 0–5)
Glucose, Bld: 98 mg/dL (ref 70–99)
HCT: 43.8 % (ref 39.0–52.0)
Hemoglobin: 14.9 g/dL (ref 13.0–17.0)
INR: 1 (ref 0.0–1.5)
Lymphocytes Relative: 30 % (ref 12–46)
Lymphs Abs: 1 10*3/uL (ref 0.7–4.0)
MCHC: 34 g/dL (ref 30.0–36.0)
MCV: 88.3 fL (ref 78.0–100.0)
Monocytes Absolute: 0.8 10*3/uL (ref 0.1–1.0)
Monocytes Relative: 23 % — ABNORMAL HIGH (ref 3–12)
Neutro Abs: 1.5 10*3/uL — ABNORMAL LOW (ref 1.7–7.7)
Neutrophils Relative %: 42 % — ABNORMAL LOW (ref 43–77)
Platelets: 162 10*3/uL (ref 150–400)
Potassium: 3.9 meq/L (ref 3.5–5.3)
Prothrombin Time: 12.8 s (ref 11.6–15.2)
RBC: 4.96 M/uL (ref 4.22–5.81)
RDW: 12.9 % (ref 11.5–15.5)
Sodium: 140 meq/L (ref 135–145)
Total Bilirubin: 5 mg/dL — ABNORMAL HIGH (ref 0.3–1.2)
Total Protein: 7.1 g/dL (ref 6.0–8.3)
WBC: 3.5 10*3/uL — ABNORMAL LOW (ref 4.0–10.5)

## 2008-07-10 ENCOUNTER — Ambulatory Visit (HOSPITAL_COMMUNITY): Admission: RE | Admit: 2008-07-10 | Discharge: 2008-07-10 | Payer: Self-pay | Admitting: Internal Medicine

## 2008-07-10 ENCOUNTER — Encounter (INDEPENDENT_AMBULATORY_CARE_PROVIDER_SITE_OTHER): Payer: Self-pay | Admitting: Internal Medicine

## 2008-07-10 ENCOUNTER — Ambulatory Visit: Payer: Self-pay | Admitting: *Deleted

## 2008-07-10 ENCOUNTER — Encounter (INDEPENDENT_AMBULATORY_CARE_PROVIDER_SITE_OTHER): Payer: Self-pay | Admitting: *Deleted

## 2008-07-10 ENCOUNTER — Ambulatory Visit: Payer: Self-pay | Admitting: Vascular Surgery

## 2008-07-10 DIAGNOSIS — R7401 Elevation of levels of liver transaminase levels: Secondary | ICD-10-CM | POA: Insufficient documentation

## 2008-07-10 DIAGNOSIS — R740 Nonspecific elevation of levels of transaminase and lactic acid dehydrogenase [LDH]: Secondary | ICD-10-CM

## 2008-07-10 DIAGNOSIS — R7402 Elevation of levels of lactic acid dehydrogenase (LDH): Secondary | ICD-10-CM | POA: Insufficient documentation

## 2008-07-10 LAB — CONVERTED CEMR LAB
Bilirubin, Direct: 2.8 mg/dL — ABNORMAL HIGH (ref 0.0–0.3)
Indirect Bilirubin: 2 mg/dL — ABNORMAL HIGH (ref 0.0–0.9)
Total Bilirubin: 4.8 mg/dL — ABNORMAL HIGH (ref 0.3–1.2)

## 2008-07-11 ENCOUNTER — Telehealth: Payer: Self-pay | Admitting: *Deleted

## 2008-07-11 LAB — CONVERTED CEMR LAB
Creatinine, Urine: 98.3 mg/dL
Microalb Creat Ratio: 5.1 mg/g (ref 0.0–30.0)
Microalb, Ur: 0.5 mg/dL (ref 0.00–1.89)

## 2008-07-12 ENCOUNTER — Ambulatory Visit (HOSPITAL_COMMUNITY): Admission: RE | Admit: 2008-07-12 | Discharge: 2008-07-12 | Payer: Self-pay | Admitting: Internal Medicine

## 2008-07-17 ENCOUNTER — Ambulatory Visit: Payer: Self-pay | Admitting: *Deleted

## 2008-07-17 ENCOUNTER — Encounter (INDEPENDENT_AMBULATORY_CARE_PROVIDER_SITE_OTHER): Payer: Self-pay | Admitting: Internal Medicine

## 2008-07-17 LAB — CONVERTED CEMR LAB
ALT: 48 units/L (ref 0–53)
AST: 103 units/L — ABNORMAL HIGH (ref 0–37)
Albumin: 3.2 g/dL — ABNORMAL LOW (ref 3.5–5.2)
Alkaline Phosphatase: 183 units/L — ABNORMAL HIGH (ref 39–117)
BUN: 5 mg/dL — ABNORMAL LOW (ref 6–23)
CO2: 25 meq/L (ref 19–32)
Calcium: 9 mg/dL (ref 8.4–10.5)
Chloride: 106 meq/L (ref 96–112)
Creatinine, Ser: 0.89 mg/dL (ref 0.40–1.50)
Glucose, Bld: 168 mg/dL — ABNORMAL HIGH (ref 70–99)
INR: 1 (ref 0.0–1.5)
Potassium: 3.3 meq/L — ABNORMAL LOW (ref 3.5–5.3)
Prothrombin Time: 13.9 s (ref 11.6–15.2)
Sodium: 139 meq/L (ref 135–145)
Total Bilirubin: 3.7 mg/dL — ABNORMAL HIGH (ref 0.3–1.2)
Total Protein: 6.7 g/dL (ref 6.0–8.3)

## 2008-07-18 LAB — CONVERTED CEMR LAB
AFP-Tumor Marker: 75.6 ng/mL — ABNORMAL HIGH (ref 0.0–8.0)
HCV Quantitative: 1700000 intl units/mL — ABNORMAL HIGH (ref <43)

## 2008-07-30 ENCOUNTER — Ambulatory Visit: Payer: Self-pay | Admitting: Internal Medicine

## 2008-08-01 ENCOUNTER — Ambulatory Visit (HOSPITAL_COMMUNITY): Admission: RE | Admit: 2008-08-01 | Discharge: 2008-08-01 | Payer: Self-pay | Admitting: Internal Medicine

## 2008-08-01 LAB — CBC WITH DIFFERENTIAL/PLATELET
BASO%: 0.3 % (ref 0.0–2.0)
Basophils Absolute: 0 10*3/uL (ref 0.0–0.1)
EOS%: 7 % (ref 0.0–7.0)
Eosinophils Absolute: 0.2 10*3/uL (ref 0.0–0.5)
HCT: 40.1 % (ref 38.4–49.9)
HGB: 13.4 g/dL (ref 13.0–17.1)
LYMPH%: 30.1 % (ref 14.0–49.0)
MCH: 28.4 pg (ref 27.2–33.4)
MCHC: 33.4 g/dL (ref 32.0–36.0)
MCV: 85 fL (ref 79.3–98.0)
MONO#: 0.7 10*3/uL (ref 0.1–0.9)
MONO%: 22.8 % — ABNORMAL HIGH (ref 0.0–14.0)
NEUT#: 1.2 10*3/uL — ABNORMAL LOW (ref 1.5–6.5)
NEUT%: 39.8 % (ref 39.0–75.0)
Platelets: 128 10*3/uL — ABNORMAL LOW (ref 140–400)
RBC: 4.72 10*6/uL (ref 4.20–5.82)
RDW: 12.6 % (ref 11.0–14.6)
WBC: 3 10*3/uL — ABNORMAL LOW (ref 4.0–10.3)
lymph#: 0.9 10*3/uL (ref 0.9–3.3)
nRBC: 0 % (ref 0–0)

## 2008-08-01 LAB — COMPREHENSIVE METABOLIC PANEL
ALT: 41 U/L (ref 0–53)
AST: 104 U/L — ABNORMAL HIGH (ref 0–37)
Albumin: 3.3 g/dL — ABNORMAL LOW (ref 3.5–5.2)
Alkaline Phosphatase: 194 U/L — ABNORMAL HIGH (ref 39–117)
BUN: 9 mg/dL (ref 6–23)
CO2: 28 mEq/L (ref 19–32)
Calcium: 9 mg/dL (ref 8.4–10.5)
Chloride: 104 mEq/L (ref 96–112)
Creatinine, Ser: 0.81 mg/dL (ref 0.40–1.50)
Glucose, Bld: 95 mg/dL (ref 70–99)
Potassium: 3.7 mEq/L (ref 3.5–5.3)
Sodium: 138 mEq/L (ref 135–145)
Total Bilirubin: 4 mg/dL — ABNORMAL HIGH (ref 0.3–1.2)
Total Protein: 6.9 g/dL (ref 6.0–8.3)

## 2008-08-01 LAB — LACTATE DEHYDROGENASE: LDH: 200 U/L (ref 94–250)

## 2008-08-29 ENCOUNTER — Encounter (INDEPENDENT_AMBULATORY_CARE_PROVIDER_SITE_OTHER): Payer: Self-pay | Admitting: Internal Medicine

## 2008-08-29 ENCOUNTER — Ambulatory Visit: Payer: Self-pay | Admitting: Internal Medicine

## 2008-08-30 ENCOUNTER — Encounter (INDEPENDENT_AMBULATORY_CARE_PROVIDER_SITE_OTHER): Payer: Self-pay | Admitting: *Deleted

## 2008-08-30 LAB — CONVERTED CEMR LAB
ALT: 99 units/L — ABNORMAL HIGH (ref 0–53)
AST: 171 units/L — ABNORMAL HIGH (ref 0–37)
Albumin: 3.4 g/dL — ABNORMAL LOW (ref 3.5–5.2)
Alkaline Phosphatase: 168 units/L — ABNORMAL HIGH (ref 39–117)
BUN: 11 mg/dL (ref 6–23)
CO2: 26 meq/L (ref 19–32)
Calcium: 9.2 mg/dL (ref 8.4–10.5)
Chloride: 107 meq/L (ref 96–112)
Creatinine, Ser: 0.81 mg/dL (ref 0.40–1.50)
GFR calc Af Amer: >60 mL/min (ref >60)
GFR calc non Af Amer: >60 mL/min (ref >60)
Glucose, Bld: 103 mg/dL — ABNORMAL HIGH (ref 70–99)
INR: 0.9 (ref 0.0–1.5)
Potassium: 4.8 meq/L (ref 3.5–5.3)
Prothrombin Time: 12.5 s (ref 11.6–15.2)
Sodium: 141 meq/L (ref 135–145)
Total Bilirubin: 3.5 mg/dL — ABNORMAL HIGH (ref 0.3–1.2)
Total Protein: 7 g/dL (ref 6.0–8.3)

## 2008-09-04 ENCOUNTER — Encounter (INDEPENDENT_AMBULATORY_CARE_PROVIDER_SITE_OTHER): Payer: Self-pay | Admitting: *Deleted

## 2008-09-05 ENCOUNTER — Ambulatory Visit: Payer: Self-pay | Admitting: *Deleted

## 2008-09-05 ENCOUNTER — Encounter (INDEPENDENT_AMBULATORY_CARE_PROVIDER_SITE_OTHER): Payer: Self-pay | Admitting: Internal Medicine

## 2008-09-05 LAB — CONVERTED CEMR LAB

## 2008-09-12 ENCOUNTER — Ambulatory Visit: Payer: Self-pay | Admitting: Internal Medicine

## 2008-09-12 DIAGNOSIS — L259 Unspecified contact dermatitis, unspecified cause: Secondary | ICD-10-CM | POA: Insufficient documentation

## 2008-09-12 DIAGNOSIS — L565 Disseminated superficial actinic porokeratosis (DSAP): Secondary | ICD-10-CM | POA: Insufficient documentation

## 2008-09-12 HISTORY — DX: Disseminated superficial actinic porokeratosis (DSAP): L56.5

## 2008-09-19 ENCOUNTER — Encounter (INDEPENDENT_AMBULATORY_CARE_PROVIDER_SITE_OTHER): Payer: Self-pay | Admitting: *Deleted

## 2008-09-30 ENCOUNTER — Telehealth (INDEPENDENT_AMBULATORY_CARE_PROVIDER_SITE_OTHER): Payer: Self-pay | Admitting: *Deleted

## 2008-10-14 ENCOUNTER — Ambulatory Visit: Payer: Self-pay | Admitting: Internal Medicine

## 2008-12-09 ENCOUNTER — Telehealth (INDEPENDENT_AMBULATORY_CARE_PROVIDER_SITE_OTHER): Payer: Self-pay | Admitting: Internal Medicine

## 2008-12-10 ENCOUNTER — Ambulatory Visit (HOSPITAL_COMMUNITY): Admission: RE | Admit: 2008-12-10 | Discharge: 2008-12-10 | Payer: Self-pay | Admitting: Internal Medicine

## 2008-12-10 ENCOUNTER — Ambulatory Visit: Payer: Self-pay | Admitting: Internal Medicine

## 2008-12-10 ENCOUNTER — Encounter: Payer: Self-pay | Admitting: Internal Medicine

## 2008-12-10 ENCOUNTER — Ambulatory Visit: Payer: Self-pay | Admitting: Vascular Surgery

## 2008-12-11 ENCOUNTER — Encounter (INDEPENDENT_AMBULATORY_CARE_PROVIDER_SITE_OTHER): Payer: Self-pay | Admitting: Internal Medicine

## 2008-12-16 ENCOUNTER — Ambulatory Visit: Payer: Self-pay | Admitting: Internal Medicine

## 2008-12-17 ENCOUNTER — Ambulatory Visit (HOSPITAL_COMMUNITY): Admission: RE | Admit: 2008-12-17 | Discharge: 2008-12-17 | Payer: Self-pay | Admitting: Internal Medicine

## 2008-12-18 ENCOUNTER — Ambulatory Visit (HOSPITAL_COMMUNITY): Admission: RE | Admit: 2008-12-18 | Discharge: 2008-12-18 | Payer: Self-pay | Admitting: Internal Medicine

## 2008-12-18 LAB — CBC WITH DIFFERENTIAL/PLATELET
BASO%: 0.3 % (ref 0.0–2.0)
Basophils Absolute: 0 10*3/uL (ref 0.0–0.1)
EOS%: 2.5 % (ref 0.0–7.0)
Eosinophils Absolute: 0.1 10*3/uL (ref 0.0–0.5)
HCT: 40.2 % (ref 38.4–49.9)
HGB: 13.6 g/dL (ref 13.0–17.1)
LYMPH%: 25.6 % (ref 14.0–49.0)
MCH: 29.6 pg (ref 27.2–33.4)
MCHC: 33.8 g/dL (ref 32.0–36.0)
MCV: 87.6 fL (ref 79.3–98.0)
MONO#: 0.5 10*3/uL (ref 0.1–0.9)
MONO%: 14.6 % — ABNORMAL HIGH (ref 0.0–14.0)
NEUT#: 2.1 10*3/uL (ref 1.5–6.5)
NEUT%: 57 % (ref 39.0–75.0)
Platelets: 134 10*3/uL — ABNORMAL LOW (ref 140–400)
RBC: 4.59 10*6/uL (ref 4.20–5.82)
RDW: 12.6 % (ref 11.0–14.6)
WBC: 3.8 10*3/uL — ABNORMAL LOW (ref 4.0–10.3)
lymph#: 1 10*3/uL (ref 0.9–3.3)

## 2008-12-18 LAB — CONVERTED CEMR LAB
ALT: 46 units/L (ref 0–53)
AST: 62 units/L — ABNORMAL HIGH (ref 0–37)
Albumin: 4.2 g/dL (ref 3.5–5.2)
Alkaline Phosphatase: 112 units/L (ref 39–117)
BUN: 6 mg/dL (ref 6–23)
Basophils Absolute: 0 10*3/uL (ref 0.0–0.1)
Basophils Relative: 1 % (ref 0–1)
CO2: 22 meq/L (ref 19–32)
Calcium: 9.1 mg/dL (ref 8.4–10.5)
Chloride: 105 meq/L (ref 96–112)
Creatinine, Ser: 0.83 mg/dL (ref 0.40–1.50)
Eosinophils Absolute: 0.1 10*3/uL (ref 0.0–0.7)
Eosinophils Relative: 2 % (ref 0–5)
Glucose, Bld: 81 mg/dL (ref 70–99)
HCT: 41.8 % (ref 39.0–52.0)
Hemoglobin: 14.1 g/dL (ref 13.0–17.0)
Lymphocytes Relative: 25 % (ref 12–46)
Lymphs Abs: 1 10*3/uL (ref 0.7–4.0)
MCHC: 33.7 g/dL (ref 30.0–36.0)
MCV: 85.3 fL (ref >=78.0)
Monocytes Absolute: 0.5 10*3/uL (ref 0.1–1.0)
Monocytes Relative: 13 % — ABNORMAL HIGH (ref 3–12)
Neutro Abs: 2.3 10*3/uL (ref 1.7–7.7)
Neutrophils Relative %: 60 % (ref 43–77)
Platelets: 130 10*3/uL — ABNORMAL LOW (ref 150–400)
Potassium: 3.6 meq/L (ref 3.5–5.3)
RBC: 4.9 M/uL (ref 4.22–5.81)
RDW: 12.2 % (ref 11.5–15.5)
Sed Rate: 12 mm/hr (ref 0–16)
Sodium: 144 meq/L (ref 135–145)
Total Bilirubin: 2.1 mg/dL — ABNORMAL HIGH (ref 0.3–1.2)
Total Protein: 8 g/dL (ref 6.0–8.3)
WBC: 3.9 10*3/uL — ABNORMAL LOW (ref 4.0–10.5)

## 2008-12-18 LAB — COMPREHENSIVE METABOLIC PANEL
ALT: 45 U/L (ref 0–53)
AST: 77 U/L — ABNORMAL HIGH (ref 0–37)
Albumin: 3.8 g/dL (ref 3.5–5.2)
Alkaline Phosphatase: 108 U/L (ref 39–117)
BUN: 13 mg/dL (ref 6–23)
CO2: 24 mEq/L (ref 19–32)
Calcium: 9 mg/dL (ref 8.4–10.5)
Chloride: 109 mEq/L (ref 96–112)
Creatinine, Ser: 1.02 mg/dL (ref 0.40–1.50)
Glucose, Bld: 94 mg/dL (ref 70–99)
Potassium: 4.2 mEq/L (ref 3.5–5.3)
Sodium: 142 mEq/L (ref 135–145)
Total Bilirubin: 1.6 mg/dL — ABNORMAL HIGH (ref 0.3–1.2)
Total Protein: 7 g/dL (ref 6.0–8.3)

## 2008-12-18 LAB — LACTATE DEHYDROGENASE: LDH: 145 U/L (ref 94–250)

## 2008-12-24 ENCOUNTER — Encounter (INDEPENDENT_AMBULATORY_CARE_PROVIDER_SITE_OTHER): Payer: Self-pay | Admitting: Dermatology

## 2009-01-30 ENCOUNTER — Ambulatory Visit: Payer: Self-pay | Admitting: Internal Medicine

## 2009-01-30 LAB — CBC WITH DIFFERENTIAL/PLATELET
BASO%: 0.4 % (ref 0.0–2.0)
Basophils Absolute: 0 10*3/uL (ref 0.0–0.1)
EOS%: 2 % (ref 0.0–7.0)
Eosinophils Absolute: 0.1 10*3/uL (ref 0.0–0.5)
HCT: 39.4 % (ref 38.4–49.9)
HGB: 13.7 g/dL (ref 13.0–17.1)
LYMPH%: 28.2 % (ref 14.0–49.0)
MCH: 30.8 pg (ref 27.2–33.4)
MCHC: 34.8 g/dL (ref 32.0–36.0)
MCV: 88.5 fL (ref 79.3–98.0)
MONO#: 0.5 10*3/uL (ref 0.1–0.9)
MONO%: 17.6 % — ABNORMAL HIGH (ref 0.0–14.0)
NEUT#: 1.4 10*3/uL — ABNORMAL LOW (ref 1.5–6.5)
NEUT%: 51.8 % (ref 39.0–75.0)
Platelets: 101 10*3/uL — ABNORMAL LOW (ref 140–400)
RBC: 4.45 10*6/uL (ref 4.20–5.82)
RDW: 13.1 % (ref 11.0–14.6)
WBC: 2.8 10*3/uL — ABNORMAL LOW (ref 4.0–10.3)
lymph#: 0.8 10*3/uL — ABNORMAL LOW (ref 0.9–3.3)

## 2009-01-30 LAB — COMPREHENSIVE METABOLIC PANEL
ALT: 53 U/L (ref 0–53)
AST: 99 U/L — ABNORMAL HIGH (ref 0–37)
Albumin: 3.7 g/dL (ref 3.5–5.2)
Alkaline Phosphatase: 137 U/L — ABNORMAL HIGH (ref 39–117)
BUN: 8 mg/dL (ref 6–23)
CO2: 21 mEq/L (ref 19–32)
Calcium: 8.8 mg/dL (ref 8.4–10.5)
Chloride: 107 mEq/L (ref 96–112)
Creatinine, Ser: 0.79 mg/dL (ref 0.40–1.50)
Glucose, Bld: 98 mg/dL (ref 70–99)
Potassium: 3.7 mEq/L (ref 3.5–5.3)
Sodium: 139 mEq/L (ref 135–145)
Total Bilirubin: 3.3 mg/dL — ABNORMAL HIGH (ref 0.3–1.2)
Total Protein: 7.1 g/dL (ref 6.0–8.3)

## 2009-01-30 LAB — LACTATE DEHYDROGENASE: LDH: 184 U/L (ref 94–250)

## 2009-03-07 ENCOUNTER — Telehealth (INDEPENDENT_AMBULATORY_CARE_PROVIDER_SITE_OTHER): Payer: Self-pay | Admitting: *Deleted

## 2009-03-17 ENCOUNTER — Encounter (INDEPENDENT_AMBULATORY_CARE_PROVIDER_SITE_OTHER): Payer: Self-pay | Admitting: Dermatology

## 2009-05-26 ENCOUNTER — Telehealth: Payer: Self-pay | Admitting: Internal Medicine

## 2009-06-02 ENCOUNTER — Telehealth: Payer: Self-pay | Admitting: *Deleted

## 2009-06-17 ENCOUNTER — Ambulatory Visit: Payer: Self-pay | Admitting: Internal Medicine

## 2009-06-18 ENCOUNTER — Ambulatory Visit: Payer: Self-pay | Admitting: Internal Medicine

## 2009-06-18 LAB — CONVERTED CEMR LAB
BUN: 11 mg/dL (ref 6–23)
CO2: 28 meq/L (ref 19–32)
Calcium: 9.5 mg/dL (ref 8.4–10.5)
Chloride: 106 meq/L (ref 96–112)
Creatinine, Ser: 0.87 mg/dL (ref 0.40–1.50)
Glucose, Bld: 112 mg/dL — ABNORMAL HIGH (ref 70–99)
Potassium: 4.7 meq/L (ref 3.5–5.3)
Sodium: 142 meq/L (ref 135–145)

## 2009-06-19 ENCOUNTER — Ambulatory Visit (HOSPITAL_COMMUNITY): Admission: RE | Admit: 2009-06-19 | Discharge: 2009-06-19 | Payer: Self-pay | Admitting: Internal Medicine

## 2009-06-19 LAB — COMPREHENSIVE METABOLIC PANEL
ALT: 63 U/L — ABNORMAL HIGH (ref 0–53)
AST: 102 U/L — ABNORMAL HIGH (ref 0–37)
Albumin: 3.3 g/dL — ABNORMAL LOW (ref 3.5–5.2)
Alkaline Phosphatase: 149 U/L — ABNORMAL HIGH (ref 39–117)
BUN: 12 mg/dL (ref 6–23)
CO2: 26 mEq/L (ref 19–32)
Calcium: 9.1 mg/dL (ref 8.4–10.5)
Chloride: 109 mEq/L (ref 96–112)
Creatinine, Ser: 0.94 mg/dL (ref 0.40–1.50)
Glucose, Bld: 95 mg/dL (ref 70–99)
Potassium: 4.2 mEq/L (ref 3.5–5.3)
Sodium: 141 mEq/L (ref 135–145)
Total Bilirubin: 2.3 mg/dL — ABNORMAL HIGH (ref 0.3–1.2)
Total Protein: 7.4 g/dL (ref 6.0–8.3)

## 2009-06-19 LAB — CBC WITH DIFFERENTIAL/PLATELET
BASO%: 0.4 % (ref 0.0–2.0)
Basophils Absolute: 0 10*3/uL (ref 0.0–0.1)
EOS%: 2.2 % (ref 0.0–7.0)
Eosinophils Absolute: 0.1 10*3/uL (ref 0.0–0.5)
HCT: 43.2 % (ref 38.4–49.9)
HGB: 14.7 g/dL (ref 13.0–17.1)
LYMPH%: 29.6 % (ref 14.0–49.0)
MCH: 30.2 pg (ref 27.2–33.4)
MCHC: 33.9 g/dL (ref 32.0–36.0)
MCV: 89 fL (ref 79.3–98.0)
MONO#: 0.6 10*3/uL (ref 0.1–0.9)
MONO%: 17.8 % — ABNORMAL HIGH (ref 0.0–14.0)
NEUT#: 1.7 10*3/uL (ref 1.5–6.5)
NEUT%: 50 % (ref 39.0–75.0)
Platelets: 95 10*3/uL — ABNORMAL LOW (ref 140–400)
RBC: 4.86 10*6/uL (ref 4.20–5.82)
RDW: 13 % (ref 11.0–14.6)
WBC: 3.4 10*3/uL — ABNORMAL LOW (ref 4.0–10.3)
lymph#: 1 10*3/uL (ref 0.9–3.3)

## 2009-06-19 LAB — LACTATE DEHYDROGENASE: LDH: 168 U/L (ref 94–250)

## 2009-06-23 ENCOUNTER — Encounter (INDEPENDENT_AMBULATORY_CARE_PROVIDER_SITE_OTHER): Payer: Self-pay | Admitting: Dermatology

## 2009-11-27 IMAGING — CT CT CHEST W/ CM
3 of 7 series · 13 of 46 positions shown, 18 images · IV contrast (OMNI 300/WATER & 100 ML OMNI 300)
Comparison: Ultrasound performed 06/13/07

CLINICAL DATA: Patient with longstanding hepatitis C.  Enlarged lymph nodes.
CHEST CT WITH CONTRAST:
TECHNIQUE: Multidetector CT imaging of the chest was performed following the standard protocol during bolus administration of intravenous contrast.
Contrast:  100 cc Omnipaque 300 and oral contrast
TECHNIQUE: Multidetector CT imaging of the abdomen was performed following the standard protocol during bolus administration of intravenous contrast.
Contrast:  100 cc Omnipaque 300
TECHNIQUE: Multidetector CT imaging of the pelvis was performed following the standard protocol during bolus administration of intravenous contrast.

[Series 2: chest/abd/pelvis · axial · 0.95mm/px · z∈[-562,-163]mm · 7 of 145 slices shown]
[im 16/145  soft-tissue]
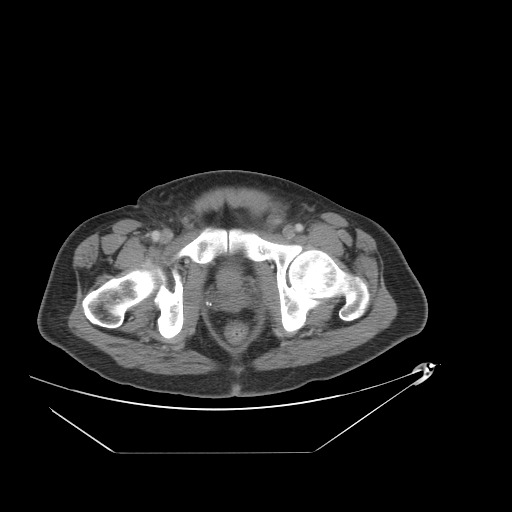
[im 31/145  soft-tissue]
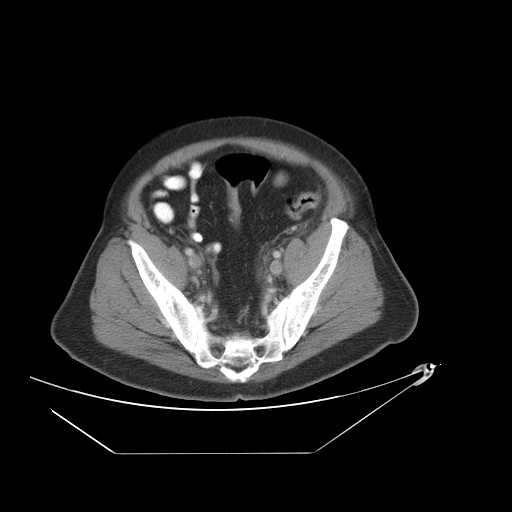
[im 46/145  soft-tissue]
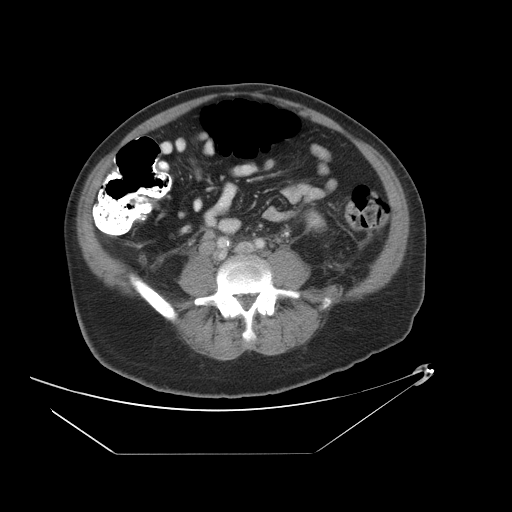
[im 61/145  soft-tissue]
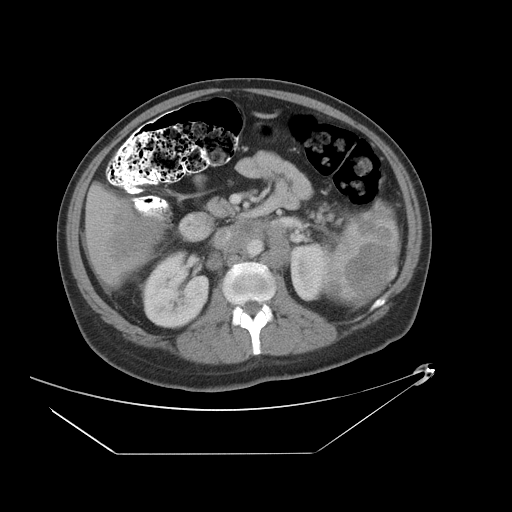
[im 84/145  soft-tissue]
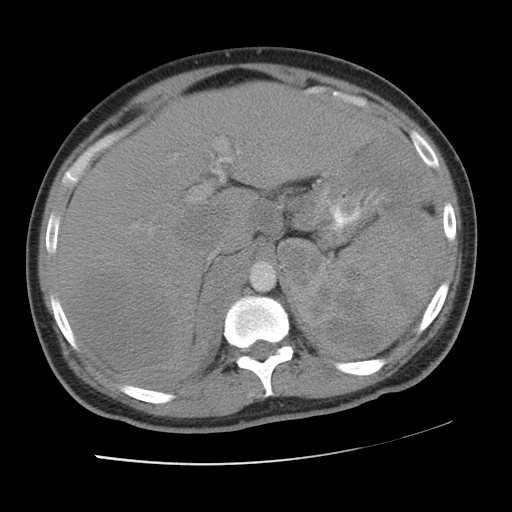
[im 99/145  soft-tissue]
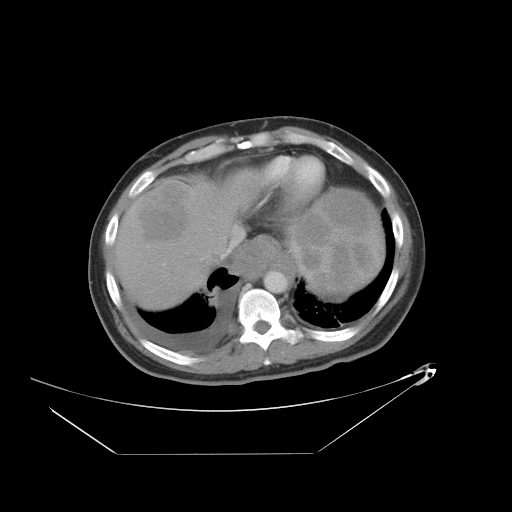
[im 114/145  soft-tissue]
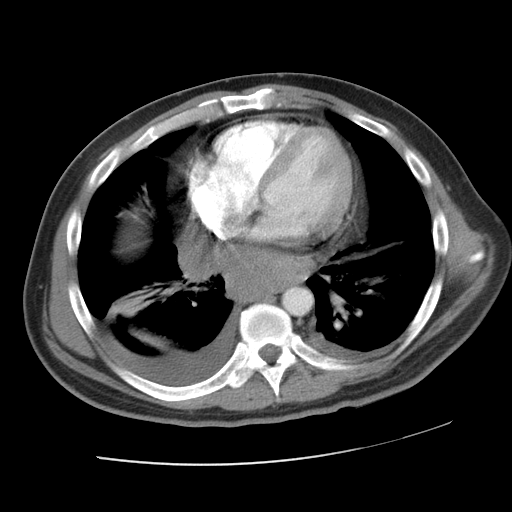

[Series 5: renal delays · axial · 0.76mm/px · z∈[-397,-317]mm · 3 of 32 slices shown, 7 images]
[im 8/32  soft-tissue]
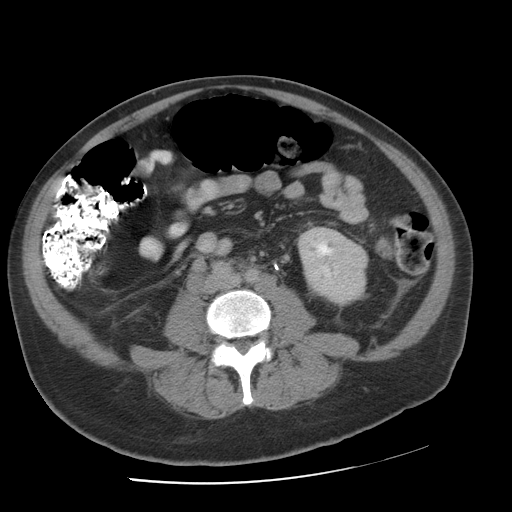
[im 8/32  lung]
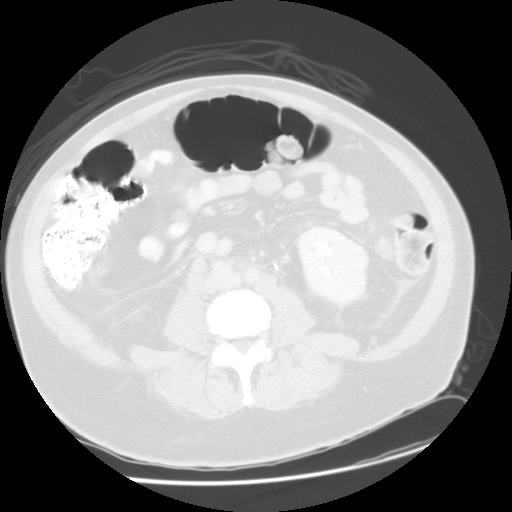
[im 8/32  bone]
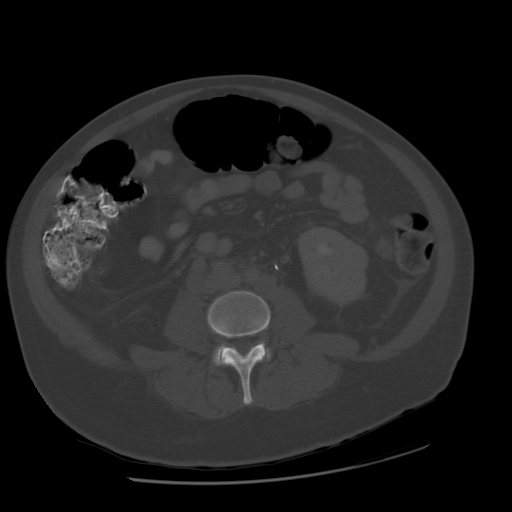
[im 16/32  soft-tissue]
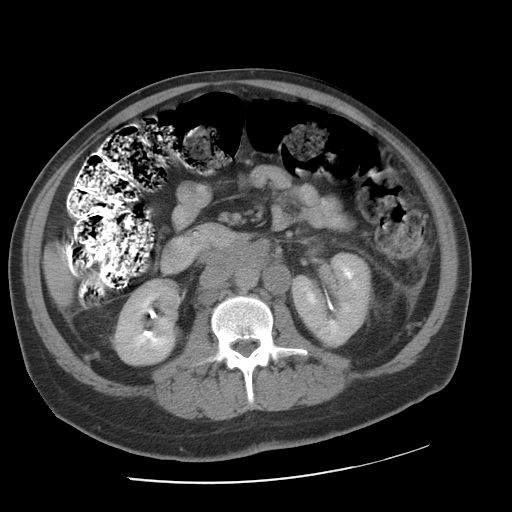
[im 16/32  lung]
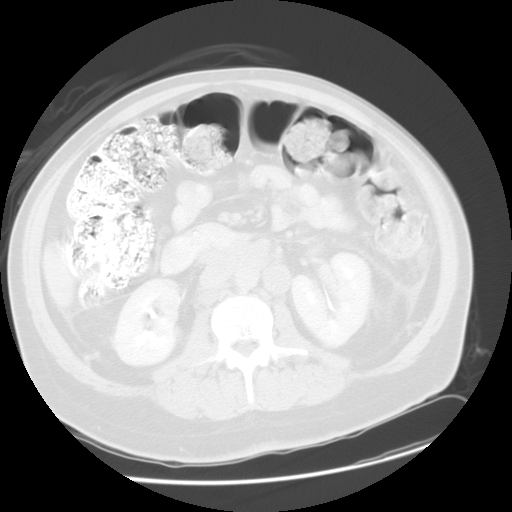
[im 24/32  soft-tissue]
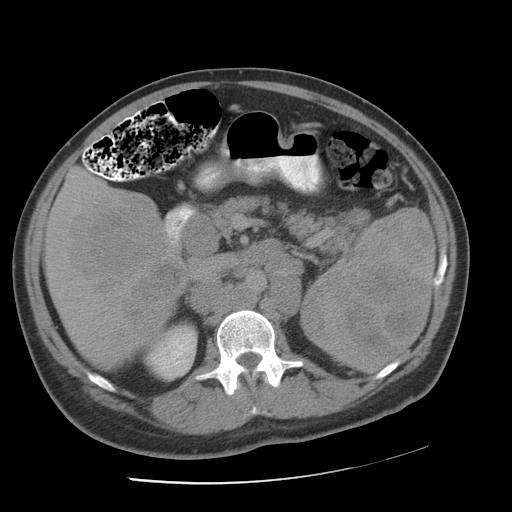
[im 24/32  lung]
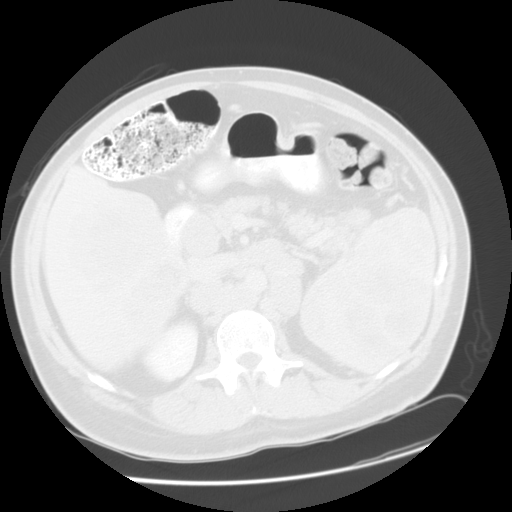

[Series 401: cor · coronal · 0.70mm/px · 3 of 142 slices shown, 4 images]
[im 36/142  soft-tissue]
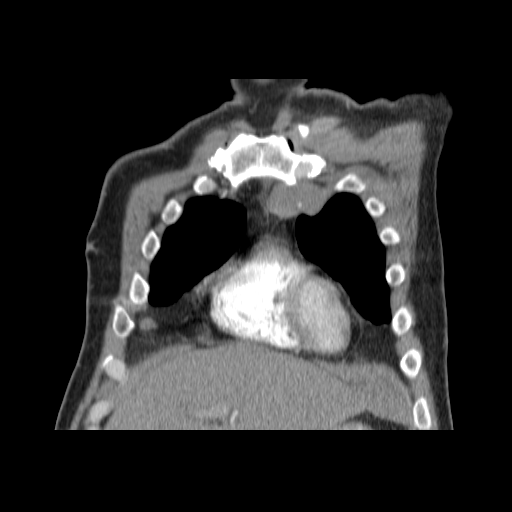
[im 71/142  soft-tissue]
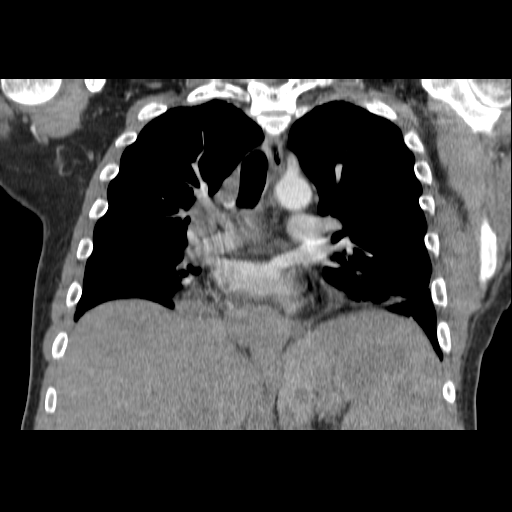
[im 71/142  bone]
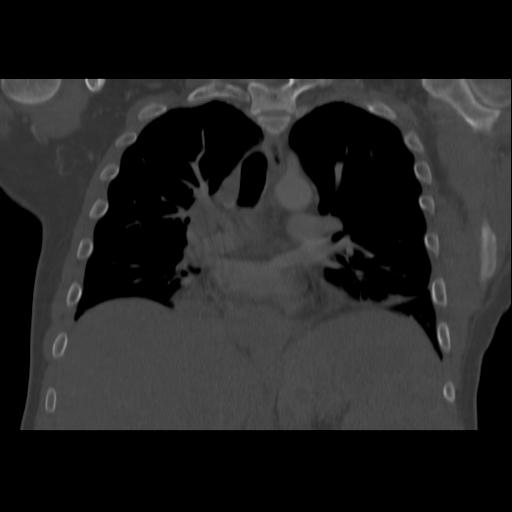
[im 106/142  soft-tissue]
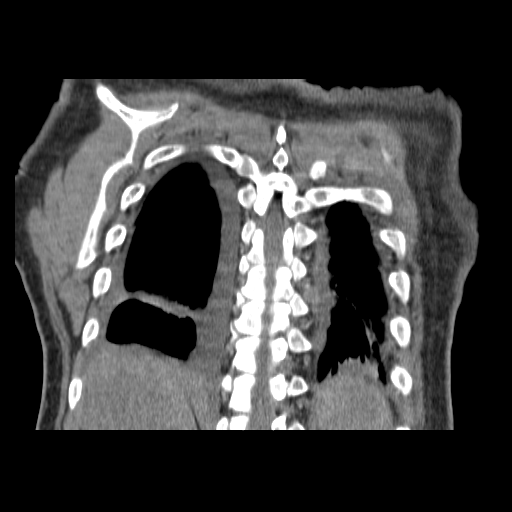

[13 of 46 positions shown; findings below may reference images not displayed]

FINDINGS: There are diffuse enlarged lymph nodes throughout the chest including left supraclavicular region, mediastinal and bilateral hilar regions, internal iliac chains, left greater than right, and pericardial/diaphragmatic regions.  Index nodes include a 1.8 x 2.9 cm left supraclavicular node (image 2), a 2.1 x 4.2 cm left internal mammary nodal mass (image 17), a 3.2 x 6.7 cm subcarinal node (image 26), and a 1.6 x 1.9 cm left hilar node (image 27).  small bilateral pleural effusions, right greater than left, are identified.  Bibasilar atelectasis is identified.  Mild centrilobular emphysema is noted. 
The heart and great vessels are within normal limits.  No evidence of pericardial effusion.
IMPRESSION: 1.  Diffuse adenopathy throughout the chest compatible with lymphoma or less likely metastatic disease.  Small bilateral pleural effusions.  
2.  Bibasilar atelectasis.
ABDOMEN CT WITH CONTRAST:
FINDINGS: There are innumerable splenic and hepatic lesions identified with index right hepatic lesion measuring 10.3 x 12.5 cm (image 71).  There is diffuse abdominal and retroperitoneal adenopathy with index central mesenteric nodal mass measuring 5.5 x 9.5 cm (image 70).   Many of these lesions and nodes have low-density centers suggesting necrosis.  Very small amount of free fluid in the pelvis is noted.  There is no evidence of biliary dilatation.  The gallbladder, pancreas, kidneys, and adrenal glands are unremarkable.  The visualized bowel is grossly unremarkable.
IMPRESSION: Diffuse abdominal and retroperitoneal lymphadenopathy and innumerable hepatic and splenic lesions likely representing lymphoma or less likely metastatic disease.
PELVIS CT WITH CONTRAST:
FINDINGS: A small amount of free fluid in the pelvis identified.  No enlarged lymph nodes identified within the pelvis.  The visualized bowel is unremarkable.
IMPRESSION: Small amount of free pelvic fluid.

## 2009-12-15 ENCOUNTER — Ambulatory Visit: Payer: Self-pay | Admitting: Internal Medicine

## 2009-12-17 ENCOUNTER — Ambulatory Visit (HOSPITAL_COMMUNITY): Admission: RE | Admit: 2009-12-17 | Discharge: 2009-12-17 | Payer: Self-pay | Admitting: Internal Medicine

## 2009-12-17 LAB — CBC WITH DIFFERENTIAL/PLATELET
BASO%: 0.7 % (ref 0.0–2.0)
Basophils Absolute: 0 10*3/uL (ref 0.0–0.1)
EOS%: 1.1 % (ref 0.0–7.0)
Eosinophils Absolute: 0 10*3/uL (ref 0.0–0.5)
HCT: 43.3 % (ref 38.4–49.9)
HGB: 14.4 g/dL (ref 13.0–17.1)
LYMPH%: 24.6 % (ref 14.0–49.0)
MCH: 28.8 pg (ref 27.2–33.4)
MCHC: 33.3 g/dL (ref 32.0–36.0)
MCV: 86.5 fL (ref 79.3–98.0)
MONO#: 0.6 10*3/uL (ref 0.1–0.9)
MONO%: 14.7 % — ABNORMAL HIGH (ref 0.0–14.0)
NEUT#: 2.3 10*3/uL (ref 1.5–6.5)
NEUT%: 58.9 % (ref 39.0–75.0)
Platelets: 97 10*3/uL — ABNORMAL LOW (ref 140–400)
RBC: 5.01 10*6/uL (ref 4.20–5.82)
RDW: 12.6 % (ref 11.0–14.6)
WBC: 4 10*3/uL (ref 4.0–10.3)
lymph#: 1 10*3/uL (ref 0.9–3.3)

## 2009-12-17 LAB — COMPREHENSIVE METABOLIC PANEL
ALT: 53 U/L (ref 0–53)
AST: 73 U/L — ABNORMAL HIGH (ref 0–37)
Albumin: 3.8 g/dL (ref 3.5–5.2)
Alkaline Phosphatase: 94 U/L (ref 39–117)
BUN: 7 mg/dL (ref 6–23)
CO2: 23 mEq/L (ref 19–32)
Calcium: 9.1 mg/dL (ref 8.4–10.5)
Chloride: 109 mEq/L (ref 96–112)
Creatinine, Ser: 0.95 mg/dL (ref 0.40–1.50)
Glucose, Bld: 108 mg/dL — ABNORMAL HIGH (ref 70–99)
Potassium: 4 mEq/L (ref 3.5–5.3)
Sodium: 139 mEq/L (ref 135–145)
Total Bilirubin: 1.8 mg/dL — ABNORMAL HIGH (ref 0.3–1.2)
Total Protein: 7.8 g/dL (ref 6.0–8.3)

## 2009-12-17 LAB — LACTATE DEHYDROGENASE: LDH: 129 U/L (ref 94–250)

## 2009-12-22 ENCOUNTER — Encounter: Payer: Self-pay | Admitting: Internal Medicine

## 2010-02-23 ENCOUNTER — Encounter: Payer: Self-pay | Admitting: Internal Medicine

## 2010-05-06 ENCOUNTER — Ambulatory Visit (HOSPITAL_COMMUNITY)
Admission: RE | Admit: 2010-05-06 | Discharge: 2010-05-06 | Payer: Self-pay | Source: Home / Self Care | Attending: Internal Medicine | Admitting: Internal Medicine

## 2010-05-06 ENCOUNTER — Ambulatory Visit
Admission: RE | Admit: 2010-05-06 | Discharge: 2010-05-06 | Payer: Self-pay | Source: Home / Self Care | Attending: Internal Medicine | Admitting: Internal Medicine

## 2010-05-06 LAB — CONVERTED CEMR LAB
BUN: 6 mg/dL (ref 6–23)
Basophils Absolute: 0 10*3/uL (ref 0.0–0.1)
Basophils Relative: 0 % (ref 0–1)
CO2: 26 meq/L (ref 19–32)
Calcium: 9.1 mg/dL (ref 8.4–10.5)
Chloride: 102 meq/L (ref 96–112)
Creatinine, Ser: 1 mg/dL (ref 0.40–1.50)
Eosinophils Absolute: 0.1 10*3/uL (ref 0.0–0.7)
Eosinophils Relative: 1 % (ref 0–5)
Glucose, Bld: 133 mg/dL — ABNORMAL HIGH (ref 70–99)
HCT: 42.3 % (ref 39.0–52.0)
Hemoglobin: 14 g/dL (ref 13.0–17.0)
Lymphocytes Relative: 16 % (ref 12–46)
Lymphs Abs: 1.1 10*3/uL (ref 0.7–4.0)
MCHC: 33.1 g/dL (ref 30.0–36.0)
MCV: 88.3 fL (ref 78.0–100.0)
Monocytes Absolute: 0.8 10*3/uL (ref 0.1–1.0)
Monocytes Relative: 11 % (ref 3–12)
Neutro Abs: 5.1 10*3/uL (ref 1.7–7.7)
Neutrophils Relative %: 71 % (ref 43–77)
Platelets: 145 10*3/uL — ABNORMAL LOW (ref 150–400)
Potassium: 4.4 meq/L (ref 3.5–5.3)
RBC: 4.79 M/uL (ref 4.22–5.81)
RDW: 12 % (ref 11.5–15.5)
Sodium: 138 meq/L (ref 135–145)
Streptococcus, Group A Screen (Direct): NEGATIVE
WBC: 7.1 10*3/uL (ref 4.0–10.5)

## 2010-05-07 ENCOUNTER — Ambulatory Visit: Admit: 2010-05-07 | Payer: Self-pay

## 2010-05-16 ENCOUNTER — Other Ambulatory Visit: Payer: Self-pay | Admitting: Internal Medicine

## 2010-05-16 DIAGNOSIS — Z8572 Personal history of non-Hodgkin lymphomas: Secondary | ICD-10-CM

## 2010-05-17 ENCOUNTER — Encounter: Payer: Self-pay | Admitting: Internal Medicine

## 2010-05-26 NOTE — Miscellaneous (Signed)
Summary: HIV Testing  HIV Testing   Imported By: Florinda Marker 06/29/2007 15:30:31  _____________________________________________________________________  External Attachment:    Type:   Image     Comment:   External Document

## 2010-05-26 NOTE — Progress Notes (Signed)
Summary: refill/gg  Phone Note Refill Request  on October 19, 2006 4:16 PM  Refills Requested: Medication #1:  LISINOPRIL 20 MG TABS Take 1 tablet by mouth once a day.   Last Refilled: 09/16/2006 last labs 4/07  Initial call taken by: Merrie Roof RN,  October 20, 2006 9:21 AM  Follow-up for Phone Call        Paper chart shows that last clinic visit was October 13, 2005 and last metabolic panel was done July 28, 2005.  I will approve a one month refill; patient needs an appointment with a physician in our clinic within one month. Follow-up by: Margarito Liner MD,  October 19, 2006 4:34 PM  Additional Follow-up for Phone Call Additional follow up Details #1::        Rx faxed to pharmacy message sent to scheduler to make appointment Additional Follow-up by: Merrie Roof RN,  October 20, 2006 9:21 AM    Prescriptions: LISINOPRIL 20 MG TABS (LISINOPRIL) Take 1 tablet by mouth once a day  #31 x 0   Entered and Authorized by:   Margarito Liner MD   Signed by:   Margarito Liner MD on 10/19/2006   Method used:   Telephoned to ...         RxID:   [id number]

## 2010-05-26 NOTE — Consult Note (Signed)
Summary: [facility]   Imported By: Louretta Parma 02/27/2010 15:21:38  _____________________________________________________________________  External Attachment:    Type:   Image     Comment:   External Document

## 2010-05-26 NOTE — Assessment & Plan Note (Signed)
Summary: swelling to bil legs/gg   Vital Signs:  Patient profile:   50 year old male Height:      66.5 inches (168.91 cm) Weight:      193.3 pounds (87.86 kg) BMI:     30.84 Temp:     97.2 degrees F (36.22 degrees C) oral Pulse rate:   72 / minute BP sitting:   119 / 73  (right arm)  Vitals Entered By: Stanton Kidney Ditzler RN (December 10, 2008 3:39 PM) Is Patient Diabetic? No Pain Assessment Patient in pain? yes     Location: left lower leg and ankle Intensity: 6-7 Onset of pain  past 2-3 weeks Nutritional Status BMI of > 30 = obese Nutritional Status Detail appetite better.  Have you ever been in a relationship where you felt threatened, hurt or afraid?denies   Does patient need assistance? Functional Status Self care Ambulation Normal Comments Sister with pt. Has FU on cancer 10.2010 - past 3-4 days felt wek and sleepy. H/a are better. Problems with right lower leg - better. Past 2-3 weeks - pain lower left leg and ankle.   History of Present Illness: 50 yo man with NHL stage IV, hep c, HTN. He is s/p CHOP chemo with Dr. Arbutus Ped, had a total of five cycles.  Awaiting to see him in October for follow up. Came to the clinic today with bilateral leg swelling, left more than right. Has been having this for three weeks now. Has some pain as well, but able to bear weight. Has gained some weight during this time. Gained 13 lb since last visit in June. No obvious abdominal swelling, (he says that was where his cancer was). No h/o clots in the legs or lungs. But did have one episode of chest discomfort couple of days ago. No cough, SOB, hemoptysis. No h/o immobility or recent travel. No problem with urine. Generally feels very tired and lethargic.     Depression History:      The patient denies a depressed mood most of the day and a diminished interest in his usual daily activities.         Preventive Screening-Counseling & Management  Alcohol-Tobacco     Alcohol drinks/day: 0  Smoking Status: quit     Year Quit: 25 - 30 years  Caffeine-Diet-Exercise     Does Patient Exercise: no  Medications Prior to Update: 1)  Protonix 40 Mg  Tbec (Pantoprazole Sodium) .... Take 1 Tablet By Mouth Once A Day 2)  Lasix 40 Mg Tabs (Furosemide) .... Take 1 Tablet By Mouth Once A Day 3)  Hydroxyzine Hcl 25 Mg Tabs (Hydroxyzine Hcl) .... Take 1 Tablet By Mouth Every 6 Hours As Needed For Itching. 4)  Clonazepam 0.5 Mg Tabs (Clonazepam) .... Take 1 Tablet By Mouth Once A Day 5)  Ultram 50 Mg Tabs (Tramadol Hcl) .... Take One Tab By Mouth Once Every 8 Hours As Needed For Pain 6)  Zyprexa 20 Mg Tabs (Olanzapine) .... Take 1 Tablet By Mouth Once A Day 7)  Klor-Con 20 Meq Pack (Potassium Chloride) .... Take 1 Tablet By Mouth Once A Day 8)  Hydroxyzine Hcl 10 Mg Tabs (Hydroxyzine Hcl) .... Take One Tab By Mouth Once Every 6 Hours As Needed For Itching  Allergies: No Known Drug Allergies  Family History: Non contributory.  Social History: Exsmoker No alcohol or drug abuse.   Review of Systems      See HPI  Physical Exam  General:  alert and well-developed.  Head:  normocephalic and atraumatic.   Eyes:  vision grossly intact.   Ears:  no external deformities.   Nose:  no external deformity.   Mouth:  pharynx pink and moist, no erythema, and no exudates.   Lungs:  normal respiratory effort and normal breath sounds.   Heart:  normal rate and regular rhythm.   Abdomen:  soft and non-tender.   Pulses:  normal peripheral pulses Extremities:  2+ left pedal edema and 1+ right pedal edema.   edema extending up to the knees bilaterally. Neurologic:  alert & oriented X3.  grossly normal. Psych:  normally interactive.     Impression & Recommendations:  Problem # 1:  LEG EDEMA, BILATERAL (ICD-782.3) Concerning for DVT with cancer as a strong risk factor, versus recurrence of tumor leading to intrabdominal/pelvic obstruction. Other possibilities for bilateral leg swelling include  liver failure related to tumor / hep C. Normal Echo done few months ago and normal renal function.   Stat LE doppler done, no evidence of DVT bilaterally. Will check c-met and follow with Dr. Ivonne Andrew about repeating PET CT early.   His updated medication list for this problem includes:    Lasix 40 Mg Tabs (Furosemide) .Marland Kitchen... Take 1 tablet by mouth once a day.  Orders: Ultrasound (Ultrasound)  Problem # 2:  NON-HODGKIN'S LYMPHOMA (ICD-202.80) Awaiting PET CT evaluation for October as planned by Dr. Ivonne Andrew. Will touch base with Dr. Ivonne Andrew about the best course of action, and how soon to repeat the test.   Orders: T-CBC w/Diff [id number]) T-Comprehensive Metabolic Panel [phone]) T-Sed Rate (Automated) ([id number])  Complete Medication List: 1)  Protonix 40 Mg Tbec (Pantoprazole sodium) .... Take 1 tablet by mouth once a day 2)  Lasix 40 Mg Tabs (Furosemide) .... Take 1 tablet by mouth once a day 3)  Hydroxyzine Hcl 25 Mg Tabs (Hydroxyzine hcl) .... Take 1 tablet by mouth every 6 hours as needed for itching. 4)  Clonazepam 0.5 Mg Tabs (Clonazepam) .... Take 1 tablet by mouth once a day 5)  Ultram 50 Mg Tabs (Tramadol hcl) .... Take one tab by mouth once every 8 hours as needed for pain 6)  Zyprexa 20 Mg Tabs (Olanzapine) .... Take 1 tablet by mouth once a day 7)  Klor-con 20 Meq Pack (Potassium chloride) .... Take 1 tablet by mouth once a day 8)  Hydroxyzine Hcl 10 Mg Tabs (Hydroxyzine hcl) .... Take one tab by mouth once every 6 hours as needed for itching  Patient Instructions: 1)  Please schedule a follow-up appointment in 2 weeks. 2)  We will let you know if anything wrong with your lab work.   3)  We will touch base with Dr. Ivonne Andrew and let you know if you need to come in earlier.   Process Orders Check Orders Results:     [facility]: Check successful Tests Sent for requisitioning (December 10, 2008 6:50 PM):     12/10/2008: [facility] --  T-CBC w/Diff [[id number]] (signed)     12/10/2008: [facility] -- T-Comprehensive Metabolic Panel [[id number]] (signed)     12/10/2008: [facility] -- T-Sed Rate (Automated) [phone] (signed)    Prevention & Chronic Care Immunizations   Influenza vaccine: Fluvax MCR  (03/02/2007)    Tetanus booster: Not documented    Pneumococcal vaccine: Not documented  Colorectal Screening   Hemoccult: Not documented    Colonoscopy: Not documented  Other Screening   PSA: Not documented   Smoking status: quit  (  12/10/2008)  Lipids   Total Cholesterol: Not documented   LDL: Not documented   LDL Direct: Not documented   HDL: Not documented   Triglycerides: Not documented  Hypertension   Last Blood Pressure: 119 / 73  (12/10/2008)   Serum creatinine: 0.81  (08/29/2008)   Serum potassium 4.8  (08/29/2008) CMP ordered     Hypertension flowsheet reviewed?: Yes   Progress toward BP goal: At goal  Self-Management Support :    Hypertension self-management support: Not documented

## 2010-05-26 NOTE — Miscellaneous (Signed)
  Clinical Lists Changes Patient with progressive increase in LFTs, and bilirubin up to 5 with even split between indirect and direct. Zyprexa can rarely cause this. Attempts to reach a Psychiatrist have been unsuccessful, but we will leave a message to speak with his Psychiatrist tomorrow to discuss stopping the medication. Will check liver with an ultrasound, and proceed from there with a lab recheck after stopping or decreasing the zyprexa and having the result of the ultrasound. Problems: Added new problem of TRANSAMINASES, SERUM, ELEVATED (ICD-790.4) - Signed Orders: Added new Test order of Ultrasound (Ultrasound) - Signed  Appended Document:  Contacted Dr. Geraldo Docker office on thursday in regards to a potential adverse effect of Zyprexa, unable to reach him, but he is supposed to return call today in regards to stopping zyprexa.  Appended Document:  Spoke with Dr. Hortencia Pilar who was very helpful. [name] has had reactions like this in the past to other medications, including prolixin. He suggested and I agree, to stop the zyprexa and start a new medication called Invega, 6mg , as an alternative as it is not hepatotoxic. We will continue with the ultrasound, and I would recheck lab work 1 week after stopping the zyprexa.   Clinical Lists Changes  Medications: Removed medication of ZYPREXA 20 MG TABS (OLANZAPINE) Take 1 tab by mouth at bedtime - Signed Added new medication of INVEGA 6 MG XR24H-TAB (PALIPERIDONE) Take 1 tablet by mouth once a day - Signed Rx of INVEGA 6 MG XR24H-TAB (PALIPERIDONE) Take 1 tablet by mouth once a day;  #31 x 1;  Signed;  Entered by: Valetta Close MD;  Authorized by: Valetta Close MD;  Method used: Telephoned to    Prescriptions: INVEGA 6 MG XR24H-TAB (PALIPERIDONE) Take 1 tablet by mouth once a day  #31 x 1   Entered and Authorized by:   Valetta Close MD   Signed by:   Valetta Close MD on 07/11/2008   Method used:   Telephoned to ...         RxID:    [id number]

## 2010-05-26 NOTE — Progress Notes (Signed)
Summary: Refill/gh  Phone Note Refill Request Message from:  Fax from Pharmacy on March 07, 2009 12:31 PM  Refills Requested: Medication #1:  LASIX 40 MG TABS Take 2 tablet by mouth once a day   Last Refilled: 01/27/2009  Method Requested: Electronic Initial call taken by: Angelina Ok RN,  March 07, 2009 12:31 PM  Follow-up for Phone Call        Rx written Follow-up by: Acey Lav MD,  March 07, 2009 2:30 PM    Prescriptions: LASIX 40 MG TABS (FUROSEMIDE) Take 2 tablet by mouth once a day  #60 x 1   Entered and Authorized by:   Acey Lav MD   Signed by:   Acey Lav MD on 03/07/2009   Method used:   Electronically to        [facility]* (retail)       [address]       [place], [place]  [id number]       Ph: [phone]       Fax: [phone]   RxID:   [id number]

## 2010-05-26 NOTE — Progress Notes (Signed)
Summary: refill/ hla  Phone Note Refill Request Message from:  Fax from Pharmacy on December 09, 2008 10:41 AM  Refills Requested: Medication #1:  KLOR-CON 20 MEQ PACK Take 1 tablet by mouth once a day   Last Refilled: 7/10 Initial call taken by: Marin Roberts RN,  December 09, 2008 10:41 AM    Prescriptions: KLOR-CON 20 MEQ PACK (POTASSIUM CHLORIDE) Take 1 tablet by mouth once a day  #31 x 1   Entered and Authorized by:   Madaline Guthrie MD   Signed by:   Madaline Guthrie MD on 12/09/2008   Method used:   Electronically to        [facility]* (retail)       [address]       [place], [place]  [id number]       Ph: [phone]       Fax: [phone]   RxID:   [id number]

## 2010-05-26 NOTE — Assessment & Plan Note (Signed)
Summary: ( ECKELBERG) PAIN IN SIDE/ SB.   Vital Signs:  Patient Profile:   50 Years Old Male Height:     66 inches (167.64 cm) Weight:      178.03 pounds BMI:     28.84 Temp:     98.7 degrees F oral Pulse rate:   128 / minute BP sitting:   149 / 80  (left arm)  Pt. in pain?   yes    Location:   stomach, side    Intensity:   3    Type:       aching  Vitals Entered By: Angelina Ok RN (May 19, 2007 11:08 AM)              Is Patient Diabetic? No Nutritional Status BMI of 25 - 29 = overweight  Have you ever been in a relationship where you felt threatened, hurt or afraid?No   Does patient need assistance? Functional Status Self care Ambulation Normal     Chief Complaint:  Constipation, stomach pain did have N&V 2 days ago, and needs refills on meds.  History of Present Illness: [name] presents with diffuse abdominal pain for the last 1 week. Pain is located all over the abdomen. It is a crampy abdominal pain.He has been constipated and has not had a bowel movement for the last 3 days. He has had some nausea but denies vomiting. He denies alcohol use. No recent change in his medications or new over the counter medications. No blood in the stool or black tarry stool.    Prior Medications :  CLOZARIL 100 MG TABS (CLOZAPINE) take 3 tabs at bedtime BENZTROPINE MESYLATE 0.5 MG  TABS (BENZTROPINE MESYLATE) Take 1 tablet by mouth two times a day FLUPHENAZINE HCL 1 MG  TABS (FLUPHENAZINE HCL) Take 1 tab by mouth at bedtime    Current Allergies: No known allergies   Past Medical History:    Reviewed history from 03/02/2007 and no changes required:       Hepatitis C:  not treatable       Hypertension       psychiatric disorder-schizophrenia       Hemorrhoids       rectal bleeding    Risk Factors: Tobacco use:  quit    Year quit:  8 years   Review of Systems  The patient denies anorexia, fever, weight loss, vision loss, decreased hearing, hoarseness, chest  pain, syncope, dyspnea on exhertion, peripheral edema, prolonged cough, hemoptysis, melena, hematochezia, severe indigestion/heartburn, hematuria, incontinence, genital sores, muscle weakness, suspicious skin lesions, transient blindness, and difficulty walking.     Physical Exam  General:     alert.   Head:     normocephalic.   Eyes:     vision grossly intact.   Mouth:     good dentition.   Neck:     supple.   Lungs:     normal respiratory effort, no intercostal retractions, no accessory muscle use, normal breath sounds, no dullness, and no fremitus.   Heart:     normal rate, regular rhythm, no murmur, no gallop, no rub, and no JVD.   Abdomen:      Tno distention, no masses, no guarding, no rigidity, and no rebound tenderness.      Impression & Recommendations:  Problem # 1:  ABDOMINAL PAIN, GENERALIZED (ICD-789.07) The abdominal pain is diffuse  buthe only has mild tenderness is localised to the RUQ. Bowel sounds are present . A hemeoccult was done  which was negative for blood. He has been noted to have a history of hepatitis. He denies alcohol use.He also mentions that he has been constipated for the last 2-3 days. He had a normal colonoscopy in 8/08 except for hemorrhoids. Will obtain a CBC, CMET and lipase. The pain may well be due to his hepatitis. He may need an US of the abdomen  if the pain is persistant. Will start him on  a laxative for the constipation and PPI for possible gastritis/reflux. He will return in 1 week if the pain does not improve. Orders: T-Comprehensive Metabolic Panel [id number]) T-CBC No Diff ([id number]) T-Lipase ([id number])   Complete Medication List: 1)  Clozaril 100 Mg Tabs (Clozapine) .... Take 3 tabs at bedtime 2)  Benztropine Mesylate 0.5 Mg Tabs (Benztropine mesylate) .... Take 1 tablet by mouth two times a day 3)  Fluphenazine Hcl 1 Mg Tabs (Fluphenazine hcl) .... Take 1 tab by mouth at bedtime 4)  Protonix 40 Mg Tbec (Pantoprazole  sodium) .... Take 1 tablet by mouth once a day 5)  Senokot S 8.6-50 Mg Tabs (Sennosides-docusate sodium) .... Take 1 tab by mouth at bedtime   Patient Instructions: 1)  Please schedule a follow-up appointment as needed. 2)  Call us if your pain does not get better in 1 week    Prescriptions: SENOKOT S 8.6-50 MG  TABS (SENNOSIDES-DOCUSATE SODIUM) Take 1 tab by mouth at bedtime  #31 x 0   Entered and Authorized by:   Ronda Fairly MD   Signed by:   Ronda Fairly MD on 05/19/2007   Method used:   Electronically sent to ...       [facility]       [address]       [place], [place]  [address]       Ph: [phone] or [phone]       Fax: [phone]   RxID:   [id number] PROTONIX 40 MG  TBEC (PANTOPRAZOLE SODIUM) Take 1 tablet by mouth once a day  #31 x 0   Entered and Authorized by:   Ronda Fairly MD   Signed by:   Ronda Fairly MD on 05/19/2007   Method used:   Electronically sent to ...       [facility]*       [address]       [place], [place]  [address]       Ph: [phone] or [phone]       Fax: [phone]   RxID:   [id number]  ]

## 2010-05-26 NOTE — Letter (Signed)
Summary: Handout Printed  Printed Handout:  - *Patient Instructions 

## 2010-05-26 NOTE — Assessment & Plan Note (Signed)
Summary: TEST RESULTS/EST/VS   Vital Signs:  Patient profile:   50 year old male Height:      66.5 inches (168.91 cm) Weight:      170.6 pounds (77.55 kg) BMI:     27.22 Temp:     97.0 degrees F (36.11 degrees C) oral Pulse rate:   63 / minute BP sitting:   121 / 78  (right arm)  Vitals Entered By: Krystal Eaton Duncan Dull) (Sep 12, 2008 2:21 PM) Is Patient Diabetic? No Pain Assessment Patient in pain? no      Nutritional Status BMI of 25 - 29 = overweight  Does patient need assistance? Functional Status Self care Ambulation Normal   History of Present Illness: Pt is a 50 year old man with PMH signficant for non-Hodgkins lymphoma status post CHOP and ritux.,  Hepatitis C, schizophrenia, and HTN who presents to clinic to follow up his HIV test which was done at his last office visit one week ago.  I have informed him of his negative test and told him to make sure he goes to the GI appointment in regards to his elevated transaminases, increased AFP, and liver nodules in the setting of his Hepatitis infection.   He has no complaints at this time.    Preventive Screening-Counseling & Management     Alcohol drinks/day: 0     Smoking Status: quit     Year Quit: 25 - 30 years     Does Patient Exercise: no  Problems Prior to Update: 1)  Transaminases, Serum, Elevated  (ICD-790.4) 2)  Skin Lesions, Multiple  (ICD-709.9) 3)  Sbo  (ICD-560.9) 4)  Pancytopenia  (ICD-284.1) 5)  Urinary Tract Infection  (ICD-599.0) 6)  Leg Edema, Bilateral  (ICD-782.3) 7)  Non-hodgkin's Lymphoma  (ICD-202.80) 8)  Lymphadenopathy, Diffuse  (ICD-785.6) 9)  Abdominal Ultrasound, Abnormal  (ICD-793.6) 10)  Abdominal Pain, Generalized  (ICD-789.07) 11)  Dark Urine  (ICD-788.69) 12)  Internal Hemorrhoids  (ICD-455.0) 13)  Rectal Bleeding  (ICD-569.3) 14)  Hemorrhoids, External  (ICD-455.3) 15)  Tachycardia  (ICD-785.0) 16)  Schizophrenia  (ICD-295.90) 17)  Hypertension  (ICD-401.9) 18)  Hepatitis C   (ICD-070.51)  Medications Prior to Update: 1)  Protonix 40 Mg  Tbec (Pantoprazole Sodium) .... Take 1 Tablet By Mouth Once A Day 2)  Lasix 40 Mg Tabs (Furosemide) .... Take 1 Tablet By Mouth Once A Day 3)  Hydroxyzine Hcl 25 Mg Tabs (Hydroxyzine Hcl) .... Take 1 Tablet By Mouth As Needed For Itching. 4)  Clonazepam 0.5 Mg Tabs (Clonazepam) .... Take 1 Tablet By Mouth Once A Day 5)  Ultram 50 Mg Tabs (Tramadol Hcl) .... Take One Tab By Mouth Once Every 8 Hours As Needed For Pain 6)  Zyprexa 20 Mg Tabs (Olanzapine) .... Take 1 Tablet By Mouth Once A Day 7)  Klor-Con 20 Meq Pack (Potassium Chloride) .... Take 1 Tablet By Mouth Once A Day 8)  Hydroxyzine Hcl 10 Mg Tabs (Hydroxyzine Hcl) .... Take One Tab By Mouth Once Every 6 Hours As Needed For Itching  Current Medications (verified): 1)  Protonix 40 Mg  Tbec (Pantoprazole Sodium) .... Take 1 Tablet By Mouth Once A Day 2)  Lasix 40 Mg Tabs (Furosemide) .... Take 1 Tablet By Mouth Once A Day 3)  Hydroxyzine Hcl 25 Mg Tabs (Hydroxyzine Hcl) .... Take 1 Tablet By Mouth As Needed For Itching. 4)  Clonazepam 0.5 Mg Tabs (Clonazepam) .... Take 1 Tablet By Mouth Once A Day 5)  Ultram 50  Mg Tabs (Tramadol Hcl) .... Take One Tab By Mouth Once Every 8 Hours As Needed For Pain 6)  Zyprexa 20 Mg Tabs (Olanzapine) .... Take 1 Tablet By Mouth Once A Day 7)  Klor-Con 20 Meq Pack (Potassium Chloride) .... Take 1 Tablet By Mouth Once A Day 8)  Hydroxyzine Hcl 10 Mg Tabs (Hydroxyzine Hcl) .... Take One Tab By Mouth Once Every 6 Hours As Needed For Itching  Allergies (verified): No Known Drug Allergies  Past History:  Past Medical History:    Hepatitis C:  not treatable    Hypertension    psychiatric disorder-schizophrenia    Hemorrhoids    rectal bleeding    Abdominal pain    Non Hodgkins Lymphoma:  Status post CHOP and Rituximab therapy : currently in remission.  (06/06/2008)  Risk Factors:    Alcohol Use: 0 (09/12/2008)    >5 drinks/d w/in last 3  months: N/A    Caffeine Use: N/A    Diet: N/A    Exercise: no (09/12/2008)  Risk Factors:    Smoking Status: quit (09/12/2008)    Packs/Day: N/A    Cigars/wk: N/A    Pipe Use/wk: N/A    Cans of tobacco/wk: N/A    Passive Smoke Exposure: N/A  Review of Systems  The patient denies fever, chest pain, syncope, peripheral edema, headaches, abdominal pain, melena, and hematochezia.    Physical Exam  General:  alert, well-developed, well-nourished, and well-hydrated.   Head:  normocephalic and atraumatic.   Mouth:  pharynx pink and moist.   Neck:  supple, no JVD, and no carotid bruits.   Lungs:  normal respiratory effort, no intercostal retractions, no accessory muscle use, normal breath sounds, no crackles, and no wheezes.   Heart:  normal rate, regular rhythm, no murmur, no gallop, and no rub.   Abdomen:  soft, non-tender, and normal bowel sounds.   Extremities:  No clubbing, cyanosis, edema, or deformity noted with normal full range of motion of all joints.   Neurologic:  alert & oriented X3.   Psych:  Oriented X3, normally interactive, good eye contact, not anxious appearing, not depressed appearing, and not agitated.     Impression & Recommendations:  Problem # 1:  TRANSAMINASES, SERUM, ELEVATED (ICD-790.4) I have informed pt of his HIV negative status and told him to make sure he goes to his GI appt which is scheduled with Dr. Madilyn Fireman on May 27th.  I have also told him to make sure he keeps his appt with me next month.  Pt does not want to stop his zyprexa.  He is to follow up his liver nodules, increased AFP and increased LFTs with GI to see what the next appropriate step is (ie biopsy, etc...).  Please see Dr. Patria Mane assessment at last office visit on 5/13.  Problem # 2:  SKIN LESIONS, MULTIPLE (ICD-709.9) Per Derm.  Type of eczema.  Continue cream.   Problem # 3:  NON-HODGKIN'S LYMPHOMA (ICD-202.80) Per Heme-Onc (Dr. Shirline Frees).  Problem # 4:  SCHIZOPHRENIA (ICD-295.90) No  change to medications.   Problem # 5:  HYPERTENSION (ICD-401.9) Well controlled.  Continue lasix.   His updated medication list for this problem includes:    Lasix 40 Mg Tabs (Furosemide) .Marland Kitchen... Take 1 tablet by mouth once a day  Problem # 6:  HEPATITIS C (ICD-070.51) Follow up GI recs.   Complete Medication List: 1)  Protonix 40 Mg Tbec (Pantoprazole sodium) .... Take 1 tablet by mouth once a day 2)  Lasix 40 Mg Tabs (Furosemide) .... Take 1 tablet by mouth once a day 3)  Hydroxyzine Hcl 25 Mg Tabs (Hydroxyzine hcl) .... Take 1 tablet by mouth as needed for itching. 4)  Clonazepam 0.5 Mg Tabs (Clonazepam) .... Take 1 tablet by mouth once a day 5)  Ultram 50 Mg Tabs (Tramadol hcl) .... Take one tab by mouth once every 8 hours as needed for pain 6)  Zyprexa 20 Mg Tabs (Olanzapine) .... Take 1 tablet by mouth once a day 7)  Klor-con 20 Meq Pack (Potassium chloride) .... Take 1 tablet by mouth once a day 8)  Hydroxyzine Hcl 10 Mg Tabs (Hydroxyzine hcl) .... Take one tab by mouth once every 6 hours as needed for itching  Patient Instructions: 1)  Please keep your schedule appointment with me next month.  Please make sure you go to your appointment with Dr. Madilyn Fireman the gastroenterologist.   2)  Call if you have any questions or problems.

## 2010-05-26 NOTE — Consult Note (Signed)
Summary: [facility]  [facility]   Imported By: Florinda Marker 09/11/2008 14:11:21  _____________________________________________________________________  External Attachment:    Type:   Image     Comment:   External Document  Appended Document: [facility]    Clinical Lists Changes  Problems: Added new problem of DISSEMINATED SUPERFICIAL ACTINIC POROKERATOSIS (ICD-692.75) Added new problem of ECZEMA (ICD-692.9)

## 2010-05-26 NOTE — Assessment & Plan Note (Signed)
Summary: CHECKUP/SB.   Vital Signs:  Patient Profile:   50 Years Old Male Height:     66 inches (167.64 cm) Weight:      169.03 pounds (76.83 kg) BMI:     27.38 Temp:     97.3 degrees F (36.28 degrees C) oral Pulse rate:   87 / minute BP sitting:   114 / 71  (right arm)  Pt. in pain?   yes    Location:   legs    Intensity:   5    Type:       soreness  Vitals Entered By: Angelina Ok RN (June 06, 2008 9:04 AM)              Is Patient Diabetic? No Nutritional Status BMI of 25 - 29 = overweight  Have you ever been in a relationship where you felt threatened, hurt or afraid?No   Does patient need assistance? Functional Status Self care Ambulation Impaired:Risk for fall     Chief Complaint:  Swelling in legs and bleeding hemmorrhoids used preparaion H which helped.  History of Present Illness: Pt is a 50 year old man with PMH significant for Non Hodgkin's lymphoma S/P CHOP plus Rituxin and in remission, internal hemorrhoids, schizophreniia, HTN, and Hep C who presents to the clinic for check up.  He complains of increased lower extremity edema since he ran out of his lasix which was prescribed by Dr Shirline Frees at the [facility].  He also reports that is hemorrhoid has been acting up as well and wants to know if he can use his Preparation H again. Pt is not sure of what medications he is on.  He is to follow up in about two months for Follow CT/PET scan at the Cancer center.     Prior Medications Reviewed Using: Patient Recall  Updated Prior Medication List: PROTONIX 40 MG  TBEC (PANTOPRAZOLE SODIUM) Take 1 tablet by mouth once a day SEROQUEL 200 MG TABS (QUETIAPINE FUMARATE) Unsure of dosing.  Per Dr. Hortencia Pilar, J. FUROSEMIDE 20 MG TABS (FUROSEMIDE) Take 1 tablet by mouth once a day HYDROXYZINE HCL 25 MG TABS (HYDROXYZINE HCL) Take 1 tablet by mouth as needed for itching. ZYPREXA 15 MG TABS (OLANZAPINE) Take 1 tablet by mouth once a day CLONAZEPAM 0.5 MG TABS  (CLONAZEPAM) Take 1 tablet by mouth once a day  Current Allergies (reviewed today): No known allergies   Past Medical History:    Hepatitis C:  not treatable    Hypertension    psychiatric disorder-schizophrenia    Hemorrhoids    rectal bleeding    Abdominal pain    Non Hodgkins Lymphoma:  Status post CHOP and Rituximab therapy : currently in remission.     Risk Factors: Tobacco use:  quit    Year quit:  10  years Alcohol use:  no Exercise:  no Seatbelt use:  100 %   Review of Systems       The patient complains of hematochezia.  The patient denies anorexia, fever, weight loss, weight gain, chest pain, dyspnea on exertion, prolonged cough, headaches, hemoptysis, abdominal pain, melena, severe indigestion/heartburn, hematuria, suspicious skin lesions, unusual weight change, and enlarged lymph nodes.     Physical Exam  General:     alert, well-developed, well-nourished, and well-hydrated.   Head:     normocephalic and atraumatic.   Neck:     supple, no masses, no thyroid nodules or tenderness, and no cervical lymphadenopathy.   Lungs:  normal respiratory effort, no intercostal retractions, no accessory muscle use, normal breath sounds, no crackles, and no wheezes.   Heart:     normal rate, regular rhythm, no murmur, and no gallop.   Abdomen:     soft, non-tender, and normal bowel sounds.   Rectal:     external hemorrhoid(s).   Extremities:     1+ left pedal edema and 1+ right pedal edema.   Neurologic:     alert & oriented X3.   Skin:     no suspicious lesions.   Cervical Nodes:     no anterior cervical adenopathy and no posterior cervical adenopathy.   Axillary Nodes:     no R axillary adenopathy and no L axillary adenopathy.   Psych:     Oriented X3, normally interactive, good eye contact, not anxious appearing, and not depressed appearing.      Impression & Recommendations:  Problem # 1:  LEG EDEMA, BILATERAL (ICD-782.3) Will reorder a 2 D echo and  check a CMET.  Pt does not have chest pain, SOB, orthopnea, PND or other symtoms. Will also refill his lasix for him but will need to find out why Dr. Shirline Frees wrote for it.   His updated medication list for this problem includes:    Furosemide 20 Mg Tabs (Furosemide) .Marland Kitchen... Take 1 tablet by mouth once a day  Orders: 2 D Echo (2 D Echo)   Problem # 2:  NON-HODGKIN'S LYMPHOMA (ICD-202.80) In remission.  Status post CHOP and rituximab. Will check a 2 D echo.  Will also check a CBC with differ.  Orders: T-CBC w/Diff ([id number]) 2 D Echo (2 D Echo)   Problem # 3:  INTERNAL HEMORRHOIDS (ICD-455.0) Will check a CBC  with differential.  I have also told pt he can use his Preparation H again.  I also encouraged him to increase the fiber in his diet, to make sure he is drinking enough water, and to not spend too much time on the toilet. Pt told to try citrucel or metamucil supplements.   Orders: T-CBC w/Diff ([id number])   Problem # 4:  HYPERTENSION (ICD-401.9) Well controlled.  Will refill his lasix.  His updated medication list for this problem includes:    Furosemide 20 Mg Tabs (Furosemide) .Marland Kitchen... Take 1 tablet by mouth once a day  Orders: T-Comprehensive Metabolic Panel ([id number])   Problem # 5:  SCHIZOPHRENIA (ICD-295.90) No change to his current medications.    Complete Medication List: 1)  Protonix 40 Mg Tbec (Pantoprazole sodium) .... Take 1 tablet by mouth once a day 2)  Seroquel 200 Mg Tabs (Quetiapine fumarate) .... Unsure of dosing.  per dr. Hortencia Pilar, j. 3)  Furosemide 20 Mg Tabs (Furosemide) .... Take 1 tablet by mouth once a day 4)  Hydroxyzine Hcl 25 Mg Tabs (Hydroxyzine hcl) .... Take 1 tablet by mouth as needed for itching. 5)  Zyprexa 15 Mg Tabs (Olanzapine) .... Take 1 tablet by mouth once a day 6)  Clonazepam 0.5 Mg Tabs (Clonazepam) .... Take 1 tablet by mouth once a day   Patient Instructions: 1)  Please schedule a follow-up appointment in 2 months. 2)  I  will call you with any abnormal lab results.  3)  You can use the preparation H for your hemorrhoids. 4)  You need to increase the amount of fiber in your diet as we discussed.  You can try metamucil or citrucell which are over the counter fiber supplements.  Make sure you are drinking  enough water as wel.   5)  I will find out which medications you are on and will refill them as needed.     Prescriptions: FUROSEMIDE 20 MG TABS (FUROSEMIDE) Take 1 tablet by mouth once a day  #30 x 1   Entered and Authorized by:   Rufina Falco MD   Signed by:   Rufina Falco MD on 06/06/2008   Method used:   Electronically to        [facility]* (retail)       [address]       [place], [place]  [address]       Ph: [phone] or [phone]       Fax: [phone]   RxID:   [id number]

## 2010-05-26 NOTE — Consult Note (Signed)
[facility]   Imported By: Florinda Marker 09/05/2008 14:19:04  _____________________________________________________________________  External Attachment:    Type:   Image     Comment:   External Document

## 2010-05-26 NOTE — Consult Note (Signed)
Summary: [facility]Dr. Arbutus Ped   Imported By: Florinda Marker 10/13/2007 14:21:36  _____________________________________________________________________  External Attachment:    Type:   Image     Comment:   External Document

## 2010-05-26 NOTE — Assessment & Plan Note (Signed)
Summary: OV/CH   Vital Signs:  Patient profile:   50 year old male Height:      66.5 inches Weight:      170.4 pounds BMI:     27.19 Temp:     96.8 degrees F Pulse rate:   65 / minute BP sitting:   126 / 81  (right arm) Cuff size:   regular  Vitals Entered By: Dorie Rank RN (Sep 05, 2008 11:31 AM) CC: here to have labs to check on liver for meds for mental health Is Patient Diabetic? No Pain Assessment Patient in pain? no      Nutritional Status BMI of 19 -24 = normal  Does patient need assistance? Functional Status Self care Ambulation Normal   CC:  here to have labs to check on liver for meds for mental health.  History of Present Illness: Pt is a 50 year old man with PMH significant for Non Hodgkin's lymphoma S/P CHOP plus Rituxin and in remission, internal hemorrhoids, schizophreniia, HTN, and Hep C who presents to the clinic for follow up of his leg pain and swelling. His legs now feel better.  We have been concerned about his elevated LFTs and increased AFP in the setting of his Hep C, use of zyprexa, and recent cancer. Nothing was noted to be changed at his last visit with the oncologist, and the CT scan showed no changed in his 2 liver masses. He is scheduled for another scan in the near future. He is unwilling to try a trial off zyprexa and on an alternative because he is scared, and appropriately so, that he will become acutely psychotic like he has been in the past. The Dermatologist prescribed a cream for his skin, and diagnosed him with having a form of eczema following a biopsy. The cream is helping, and the lesions have gottena little better.  The itching has persisted, and his eyes have remained a bit yellow.  He admits to some episodes of forgetfulness, he thinks it is more frequent over the past several weeks. He has not been shaky, and has not noticed his eyes to look more yellow. He has not felt any abdominal pain. HIs urine has not been dark. He states he  has normal bowel movements, though he goes 2-3 times daily. He drinks a lot of milk, and ice cream, and this increases his frequency of bowel movements.  PMH and SH reviewed and amended.   Preventive Screening-Counseling & Management     Smoking Status: quit     Year Quit: 25 - 30 years  Current Medications (verified): 1)  Protonix 40 Mg  Tbec (Pantoprazole Sodium) .... Take 1 Tablet By Mouth Once A Day 2)  Lasix 40 Mg Tabs (Furosemide) .... Take 1 Tablet By Mouth Once A Day 3)  Hydroxyzine Hcl 25 Mg Tabs (Hydroxyzine Hcl) .... Take 1 Tablet By Mouth As Needed For Itching. 4)  Clonazepam 0.5 Mg Tabs (Clonazepam) .... Take 1 Tablet By Mouth Once A Day 5)  Ultram 50 Mg Tabs (Tramadol Hcl) .... Take One Tab By Mouth Once Every 8 Hours As Needed For Pain 6)  Zyprexa 20 Mg Tabs (Olanzapine) .... Take 1 Tablet By Mouth Once A Day 7)  Klor-Con 20 Meq Pack (Potassium Chloride) .... Take 1 Tablet By Mouth Once A Day 8)  Hydroxyzine Hcl 10 Mg Tabs (Hydroxyzine Hcl) .... Take One Tab By Mouth Once Every 6 Hours As Needed For Itching  Allergies: No Known Drug Allergies  Review  of Systems      See HPI   Impression & Recommendations:  Problem # 1:  TRANSAMINASES, SERUM, ELEVATED (ICD-790.4) Because of his worsening elevated LFTs including bilirubin in the setting of hepatitis C, 2 liver masses, a rising AFP, display of symptoms (itching), and recent hx of advanced malignancy treated with chemo, I will refer Mr. Goedken to a G.I specialist for evaluation. I do not know if he needs a liver biopsy, dedicated liver MRI, treatment of his hepatitis C (refer to Hep C clinic, though treatment for this with his recent need for chemo may be risky, and probably not appropriate), or how to further evaluate the cause of his worsening hepatic function. I appreciate any assistance that a gastroenterologist can provide. This was discussed at length with Mr. Rodier for a total of 15 minutes. Orders: Gastroenterology  Referral (GI)  Complete Medication List: 1)  Protonix 40 Mg Tbec (Pantoprazole sodium) .... Take 1 tablet by mouth once a day 2)  Lasix 40 Mg Tabs (Furosemide) .... Take 1 tablet by mouth once a day 3)  Hydroxyzine Hcl 25 Mg Tabs (Hydroxyzine hcl) .... Take 1 tablet by mouth as needed for itching. 4)  Clonazepam 0.5 Mg Tabs (Clonazepam) .... Take 1 tablet by mouth once a day 5)  Ultram 50 Mg Tabs (Tramadol hcl) .... Take one tab by mouth once every 8 hours as needed for pain 6)  Zyprexa 20 Mg Tabs (Olanzapine) .... Take 1 tablet by mouth once a day 7)  Klor-con 20 Meq Pack (Potassium chloride) .... Take 1 tablet by mouth once a day 8)  Hydroxyzine Hcl 10 Mg Tabs (Hydroxyzine hcl) .... Take one tab by mouth once every 6 hours as needed for itching  Other Orders: T-HIV Antibody  (Reflex) ([id number])  Patient Instructions: 1)  Please return to the clinic in one week to get the results of the HIV test, and in one month for a follow up visit and lab work. 2)  Please keep the appointment with the liver specialist.

## 2010-05-26 NOTE — Progress Notes (Signed)
Summary: Refill/gh  Phone Note Refill Request Message from:  Pharmacy  Refills Requested: Medication #1:  LASIX 40 MG TABS Take 2 tablet by mouth once a day   Last Refilled: 05/07/2009 Last labs and visit 8/18 amd 12/10/2008.   Method Requested: Electronic Initial call taken by: Angelina Ok RN,  May 26, 2009 4:57 PM  Follow-up for Phone Call        It looks like at the last refill the dose was increased to  80 mg by mouth QAM from 40 mg by mouth QAM which was the dose prescribed at the last clinic visit.  Please schedule Mr. Kluttz with Dr. Melynda Keller at his next available non-overbook appointment within the next three months to re-evaluate how he is doing on the higher dose of lasix and assure this remains the appropriate dose for the patient.  Thank You. Follow-up by: Doneen Poisson MD,  May 26, 2009 6:10 PM  Additional Follow-up for Phone Call Additional follow up Details #1::        Flag to C. Boone to schedule an appointment within the next 3 months. Additional Follow-up by: Angelina Ok RN,  May 30, 2009 2:58 PM    Prescriptions: LASIX 40 MG TABS (FUROSEMIDE) Take 2 tablet by mouth once a day  #60 x 2   Entered and Authorized by:   Doneen Poisson MD   Signed by:   Doneen Poisson MD on 05/26/2009   Method used:   Electronically to        [facility]* (retail)       [address]       [place], [place]  [id number]       Ph: [phone]       Fax: [phone]   RxID:   [id number]

## 2010-05-26 NOTE — Progress Notes (Signed)
Summary: Refill/gh  Phone Note Refill Request Message from:  Fax from Pharmacy on September 30, 2008 3:39 PM  Refills Requested: Medication #1:  KLOR-CON 20 MEQ PACK Take 1 tablet by mouth once a day   Last Refilled: 08/19/2008  Medication #2:  HYDROXYZINE HCL 10 MG TABS Take one tab by mouth once every 6 hours as needed for itching.   Last Refilled: 08/19/2008  Method Requested: Electronic Initial call taken by: Angelina Ok RN,  September 30, 2008 3:39 PM    New/Updated Medications: HYDROXYZINE HCL 25 MG TABS (HYDROXYZINE HCL) Take 1 tablet by mouth every 6 hours as needed for itching.   Prescriptions: HYDROXYZINE HCL 25 MG TABS (HYDROXYZINE HCL) Take 1 tablet by mouth every 6 hours as needed for itching.  #90 x 2   Entered and Authorized by:   Rufina Falco MD   Signed by:   Rufina Falco MD on 10/01/2008   Method used:   Electronically to        [facility]* (retail)       [address]       [place], [place]  [id number]       Ph: [phone]       Fax: [phone]   RxID:   [id number] KLOR-CON 20 MEQ PACK (POTASSIUM CHLORIDE) Take 1 tablet by mouth once a day  #31 x 1   Entered and Authorized by:   Rufina Falco MD   Signed by:   Rufina Falco MD on 10/01/2008   Method used:   Electronically to        [facility]* (retail)       [address]       [place], [place]  [id number]       Ph: [phone]       Fax: [phone]   RxID:   [id number]

## 2010-05-26 NOTE — Progress Notes (Signed)
----   Converted from flag ---- ---- 07/11/2008 4:28 PM, Chinita Pester RN wrote: [name] was called and instructed to stop Zyprexa and start taking Invega per Dr. Noel Gerold.  ---- 07/11/2008 4:13 PM, Valetta Close MD wrote: Please call [name] about stopping the zyprexa, and phone in invega 6mg  by mouth once daily to his pharmacy of choice. Thank you. He should know that this was discussed with his Psychiatrist. ------------------------------
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
## 2010-05-26 NOTE — Progress Notes (Signed)
Summary: med refill/gp  Phone Note Refill Request Message from:  Patient on June 02, 2009 5:03 PM  Refills Requested: Medication #1:  KLOR-CON 20 MEQ PACK Take 1 tablet by mouth once a day Last appt. and CMP 12/10/08.   Method Requested: Electronic Initial call taken by: Chinita Pester RN,  June 02, 2009 5:03 PM  Follow-up for Phone Call        Refilled electronically.  Please make an appointment this month with his PCP and notify patient. Follow-up by: Margarito Liner MD,  June 02, 2009 5:26 PM  Additional Follow-up for Phone Call Additional follow up Details #1::        Flag sent to [facility] for an appt. Additional Follow-up by: Chinita Pester RN,  June 03, 2009 10:05 AM    Prescriptions: KLOR-CON 20 MEQ PACK (POTASSIUM CHLORIDE) Take 1 tablet by mouth once a day  #30 x 0   Entered and Authorized by:   Margarito Liner MD   Signed by:   Margarito Liner MD on 06/02/2009   Method used:   Electronically to        [facility]* (retail)       [address]       [place], [place]  [id number]       Ph: [phone]       Fax: [phone]   RxID:   [id number]

## 2010-05-26 NOTE — Consult Note (Signed)
Summary: [facility]   Imported By: Florinda Marker 10/11/2008 14:27:53  _____________________________________________________________________  External Attachment:    Type:   Image     Comment:   External Document

## 2010-05-26 NOTE — Assessment & Plan Note (Signed)
Summary: lab orders and med refill appt per dr joines/gayle/cfb   Vital Signs:  Patient Profile:   50 Years Old Male Height:     66 inches (167.64 cm) Weight:      200.9 pounds (91.32 kg) BMI:     32.54 Temp:     97.2 degrees F Pulse rate:   112 / minute Pulse (ortho):   111 / minute BP sitting:   133 / 86  (left arm) BP standing:   122 / 81  Pt. in pain?   no  Vitals Entered By: Dorie Rank RN (November 03, 2006 1:37 PM)              Is Patient Diabetic? No Nutritional Status BMI of > 30 = obese  Does patient need assistance? Functional Status Self care Ambulation Normal Comments using Preparation H for hemorrhoids   Serial Vital Signs/Assessments:  Time      Position  BP       Pulse  Resp  Temp     By 1415      Lying RA  138/87   109                   Dorie Rank RN 1415      Sitting   144/87   104                   Dorie Rank RN 1415      Standing  122/81   111                   Dorie Rank RN   Chief Complaint:  recheck.  History of Present Illness: Pt is a 50 year old AAM with PMH significant for Hepatitis C, hypertension, and schizophrenia presenting to clinic for check up.  Pt complains of having some rectal bleeding.  He reports bright red blood per rectum when he has a bowel movement.  He reports the blood is mixed in with the stool and on the toilet paper.  Pt does have hemorrhoids.  He denies any melena, weight loss, change in bowel habits, or family history of colon or GI cancer.  Of note, pt mentioned occas. dizziness upon standing but denies syncope or palpitations.  Pt was also found to be tachycardic on exam.  No other complaints.   Current Allergies: No known allergies   Past Medical History:    Hepatitis C    Hypertension    psychiatric disorder-schizophrenia    Hemorrhoids    rectal bleeding    Risk Factors:  Tobacco use:  quit    Year quit:  8 years   Review of Systems       See HPI   Physical Exam  General:     alert,  well-developed, well-nourished, and well-hydrated.   Head:     normocephalic and atraumatic.   Lungs:     normal respiratory effort, no intercostal retractions, no accessory muscle use, normal breath sounds, no crackles, and no wheezes.   Heart:     regular rhythm, no murmur, no gallop, no rub, and tachycardia.   Abdomen:     soft, non-tender, and normal bowel sounds.   Rectal:     normal sphincter tone, no tenderness, no perianal rash, and external hemorrhoid(s).   Prostate:     no gland enlargement, no nodules, and no asymmetry.   Msk:     normal ROM.   Extremities:     No  clubbing, cyanosis, edema, or deformity noted with normal full range of motion of all joints.   Neurologic:     alert & oriented X3.   Skin:     color normal, no rashes, and no suspicious lesions.   Psych:     Oriented X3.      Impression & Recommendations:  Problem # 1:  RECTAL BLEEDING (ICD-569.3) Pt has a large external hemorrhoid on exam.  He is FOBT negative currently with no blood or melena noted.  Pt reports a small amount of rectal bleeding which is most likely secondary to his hemorrhoids.  However, given pt is 50 years old I think a screening colonoscopy is warranted.  Pt is to have GI appointment on August 4th.  I will have pt come back in 6-8 weeks after GI assessment has been completed.  I have also checked pts orthostatic vital signs.  Pt has a 16 point drop in systolic blood pressure going from lying to standing which is not diagnostic of orthostasis.  Pt is stable and is slightly tachycardic.  I will check a CBC to make sure the pts hemoglobin is not too low.  I will let pt go home and call to let him know if his hemoglobin is too low.  Pt was told to take fiber supplements, to keep himself very clean after bowel movements and to try to lose a little weight. I will also check a CMET to eyeball his LFTs.   Orders: T-CBC w/Diff ([id number]) Gastroenterology Referral (GI)   Problem # 2:   TACHYCARDIA (ICD-785.0) Will check a TSH and checked an EKG which showed sinus tachycardia.  Will follow up with the results of the CBC and the TSH.  EKG looked ok.  Will need to evaluate at follow up.  Pt denied illicit drugs and alcohol. Orders: T-TSH ([id number]) 12 Lead EKG (12 Lead EKG)   Problem # 3:  SCHIZOPHRENIA (ICD-295.90) NO change to current treatment regimen.    Problem # 4:  HYPERTENSION (ICD-401.9) Blood pressure with adequate control.  Orthostatic vital signs checked and are borderline but are normal.  Will refill pts Lisinopril prescription.  Will also check pts CBC to make sure he is not profoundly anemic, which could be contributing to his borderline orthostatics.  His updated medication list for this problem includes:    Lisinopril 20 Mg Tabs (Lisinopril) .Marland Kitchen... Take 1 tablet by mouth once a day   Problem # 5:  HEPATITIS C (ICD-070.51) Will check a CMET. Orders: T-Comprehensive Metabolic Panel ([id number])   Problem # 6:  HEMORRHOIDS, EXTERNAL (ICD-455.3) Please see the assessment on Rectal bleeding.    Patient Instructions: 1)  Please schedule a follow-up appointment in 1 month.   2)  We will need to schedule a colonoscopy to evaluate your rectal bleeding.  It is likely due to your hemorrhoids but we need to make sure there is not a colon cancer causing your bleeding.  We will call you if your labs done today are abnormal.   3)  You can take fiber (metamucil or citrucel), keep yourself very clean after you have a bowel movement and you can try to lose a little weight to help your hemorrhoids.      Prescriptions: LISINOPRIL 20 MG TABS (LISINOPRIL) Take 1 tablet by mouth once a day  #31 x 12   Entered and Authorized by:   Rufina Falco MD   Signed by:   Rufina Falco MD on 11/03/2006   Method used:  Print then Give to Patient   RxID:   [id number]

## 2010-05-26 NOTE — Consult Note (Signed)
[name]: Dr. Barrett Shell [facility]: Dr. Madilyn Fireman   Imported By: Florinda Marker 12/16/2006 11:47:06  _____________________________________________________________________  External Attachment:    Type:   Image     Comment:   External Document  Appended Document: [name]: Dr. Madilyn Fireman Pt had colonoscopy done on 12/20/06.  Results included small internal hemorrhoids.  Next C-scope to be done in 10 years.    Clinical Lists Changes  Problems: Added new problem of INTERNAL HEMORRHOIDS (ICD-455.0)

## 2010-05-26 NOTE — Consult Note (Signed)
Summary: [facility]   Imported By: Florinda Marker 03/19/2009 14:28:25  _____________________________________________________________________  External Attachment:    Type:   Image     Comment:   External Document

## 2010-05-26 NOTE — Assessment & Plan Note (Signed)
Summary: TO DISCUSS MEDICATION CHANGES(YOUNG)/CFB   Vital Signs:  Patient profile:   50 year old male Height:      66 inches (167.64 cm) Weight:      176.5 pounds (80.23 kg) BMI:     28.59 Temp:     97.8 degrees F (36.56 degrees C) oral Pulse rate:   89 / minute BP sitting:   119 / 72  (right arm)  Vitals Entered By: Stanton Kidney Ditzler RN (July 17, 2008 9:58 AM) Is Patient Diabetic? No Pain Assessment Patient in pain? yes     Location: feet Intensity: 3 Onset of pain  long time Nutritional Status BMI of 25 - 29 = overweight  Have you ever been in a relationship where you felt threatened, hurt or afraid?denies   Does patient need assistance? Functional Status Self care Ambulation Impaired:Risk for fall Comments Uses a cane - both sisters with pt. Discuss cont ZyPrexa and [place]. FU on legs - sl better.   History of Present Illness: Pt is a 50 year old man with PMH significant for Non Hodgkin's lymphoma S/P CHOP plus Rituxin and in remission, internal hemorrhoids, schizophreniia, HTN, and Hep C who presents to the clinic for follow up of his leg pain and swelling. He notes that the swelling is unchanged, as well as the pain. The pain is intermittently worse, but always there. It is worse specifically with exercise, and is located in his calves and ankles. Elevating his legs helps somewhat with the swelling, but not the pain. He states that his next pet scan is in about a month.  He has skin lesions noted in my last note that he has been referred to a Dermatologist for eval and possibly biopsy.  He is going to see Dr. Shirline Frees on 4/8, and the Dermatologist on 4/6, and Dr. Laneta Simmers on 4/1. The family would prefer him to stay on zyprexa because the invega has not worked for them in the past. We spent 20 minutes discussing the risks of staying on this medication. We also obtained a stat cmet and PT to see if it was getting worse. The cmet returned a bit improved. Hinda Glatter is on his med rec list,  but he is still taking the zypreza. His family states that Hinda Glatter was the medication tried recently and it did not work at all. They state when his psychiatric medications are not working, he becomes acutely psychotic and they want to avoid that. Along with him, everyone appears wanting to risk continuing the zyprexa as long as his LFTs do not continue to worsen. They were advised of the potential risks associated with this.  Preventive Screening-Counseling & Management     Smoking Status: quit  Current Medications (verified): 1)  Protonix 40 Mg  Tbec (Pantoprazole Sodium) .... Take 1 Tablet By Mouth Once A Day 2)  Lasix 40 Mg Tabs (Furosemide) .... Take 1 Tablet By Mouth Once A Day 3)  Hydroxyzine Hcl 25 Mg Tabs (Hydroxyzine Hcl) .... Take 1 Tablet By Mouth As Needed For Itching. 4)  Clonazepam 0.5 Mg Tabs (Clonazepam) .... Take 1 Tablet By Mouth Once A Day 5)  Ultram 50 Mg Tabs (Tramadol Hcl) .... Take One Tab By Mouth Once Every 8 Hours As Needed For Pain 6)  Invega 6 Mg Xr24h-Tab (Paliperidone) .... Take 1 Tablet By Mouth Once A Day  Allergies: No Known Drug Allergies  Review of Systems General:  Denies chills, fatigue, fever, and malaise. Eyes:  scleral icterus. ENT:  Denies nasal  congestion and sore throat. CV:  Denies chest pain or discomfort, difficulty breathing at night, difficulty breathing while lying down, lightheadness, and palpitations. Resp:  Denies cough and shortness of breath. GI:  Denies abdominal pain, change in bowel habits, constipation, diarrhea, nausea, vomiting, and yellowish skin color. GU:  Denies urinary frequency and urinary hesitancy. Neuro:  Denies headaches, memory loss, visual disturbances, and weakness. Psych:  Denies anxiety, depression, irritability, and mental problems.  Physical Exam  General:  alert, well-developed, well-nourished, and well-hydrated.   Eyes:  vision grossly intact.  No sign of scleral icterus Mouth:  No sign of oral  thrush Lungs:  normal respiratory effort, no intercostal retractions, no accessory muscle use, normal breath sounds, no crackles, and no wheezes.   Heart:  normal rate, regular rhythm, no murmur, and no gallop.   Abdomen:  soft, non-tender, and normal bowel sounds.   Extremities:  trace left pedal edema and trace right pedal edema, improved from last visit Neurologic:  alert & oriented X3.   Skin:  He has multiple 0.5 to 1cm in diameter dark black non itchy and non erythematous papules located throughout his body in no appreciable pattern. THey are all of similar age and size. They are present on his arms, legs, buttocks, abdomen, and back. THey are not present on his palms. Psych:  Oriented X3, normally interactive, good eye contact, not anxious appearing, and not depressed appearing.     Impression & Recommendations:  Problem # 1:  TRANSAMINASES, SERUM, ELEVATED (ICD-790.4) Rechecked and they were a bit improved. HIs U/S was unchanged. At this time his family wishes to continue with zyprexa despite the possibility that it may be worsening his liver function, and could potentially lead to irreversible damage. They are willing to return to have the labs checked weekly to make sure they do not continue to worsen. They will see the Psychiatrist in 2 weeks at which time an alternative medication may be discussed. In the interim, I will see if the hep C virus has increased and may be contributing to his itching and elevated LFTs. Will also screen AFP again. It was 10 when last checked. Orders: T-Comprehensive Metabolic Panel [id number]) T-Protime, Auto [id number]) T- * Misc. Laboratory test [id number]) T-Hepatitis C Viral Load [id number])  Problem # 2:  SKIN LESIONS, MULTIPLE (ICD-709.9) F/U with derm  Problem # 3:  NON-HODGKIN'S LYMPHOMA (ICD-202.80) F/.U with onc in 2 weeks.  Problem # 4:  SCHIZOPHRENIA (ICD-295.90) As discussed in number 1. A total of 50 minutes was spent discussing Mr.  Korinek condition and the risks and benefits of continued treatment with zyprexa. At their request, their records since 2007 were gone over with them, including medications and prior Liver function tests. They were also kept in the clinic while the LFTs returned, and following that, the results were discussed in the setting of his medical conditions.  Complete Medication List: 1)  Protonix 40 Mg Tbec (Pantoprazole sodium) .... Take 1 tablet by mouth once a day 2)  Lasix 40 Mg Tabs (Furosemide) .... Take 1 tablet by mouth once a day 3)  Hydroxyzine Hcl 25 Mg Tabs (Hydroxyzine hcl) .... Take 1 tablet by mouth as needed for itching. 4)  Clonazepam 0.5 Mg Tabs (Clonazepam) .... Take 1 tablet by mouth once a day 5)  Ultram 50 Mg Tabs (Tramadol hcl) .... Take one tab by mouth once every 8 hours as needed for pain 6)  Zyprexa 20 Mg Tabs (Olanzapine) .... Take 1 tablet by mouth once  a day 7)  Klor-con 20 Meq Pack (Potassium chloride) .... Take 1 tablet by mouth once a day 8)  Hydroxyzine Hcl 10 Mg Tabs (Hydroxyzine hcl) .... Take one tab by mouth once every 6 hours as needed for itching  Patient Instructions: 1)  Please return to the clinic in one week for a test of your liver functions. 2)  Please keep all the appointments with the specialists. 3)  You may continue the zyprexa, and I hope the issues with your liver resolve. If you develop confusion, skin turns yellow, or you become very shaky, please contact us. Prescriptions: HYDROXYZINE HCL 10 MG TABS (HYDROXYZINE HCL) Take one tab by mouth once every 6 hours as needed for itching  #62 x 1   Entered and Authorized by:   Valetta Close MD   Signed by:   Valetta Close MD on 07/17/2008   Method used:   Print then Give to Patient   RxID:   [id number] KLOR-CON 20 MEQ PACK (POTASSIUM CHLORIDE) Take 1 tablet by mouth once a day  #31 x 1   Entered and Authorized by:   Valetta Close MD   Signed by:   Valetta Close MD on 07/17/2008   Method used:    Print then Give to Patient   RxID:   [id number]   Appended Document: TO DISCUSS MEDICATION CHANGES(YOUNG)/CFB Office notes and labs per Dr Noel Gerold faxed to Dr Shirline Frees and Dr Hortencia Pilar per instructions Dr Noel Gerold.

## 2010-05-26 NOTE — Assessment & Plan Note (Signed)
Summary: NEED TEST RESULT/ SB.   Vital Signs:  Patient Profile:   50 Years Old Male Height:     66 inches (167.64 cm) Weight:      171.7 pounds (78.05 kg) BMI:     27.81 Temp:     98.0 degrees F (36.67 degrees C) oral Pulse rate:   125 / minute BP sitting:   136 / 94  (right arm)  Pt. in pain?   yes    Location:   lt side abd    Intensity:   7    Type:       sharp  Vitals Entered By: Chinita Pester RN (June 28, 2007 2:09 PM)              Is Patient Diabetic? No Nutritional Status BMI of 25 - 29 = overweight  Have you ever been in a relationship where you felt threatened, hurt or afraid?Unable to ask; visitor with pt.   Does patient need assistance? Functional Status Self care Ambulation Normal     Chief Complaint:  liver bx result also c/o dizziness and no appetite.  History of Present Illness: [name] is  a 50 yo gentleman with a h/o hepatitis C who came with abdominal pain last month and the workup was positive for multiple masses on the liver and spleen adenopathy as well. He recently had USG guided biopsy of the liver and the pt. is here today for a f/u visit with his sister.   His abdominal pain is better than before but he still has some pain. He is also somewhat sleepy with the present dose of the medicaiton.      Current Allergies: No known allergies     Risk Factors: Tobacco use:  quit    Year quit:  10  years Alcohol use:  no Exercise:  no Seatbelt use:  100 %   Review of Systems  General      Denies chills and fever.  CV      Complains of swelling of feet.      Denies chest pain or discomfort and shortness of breath with exertion.  Resp      Denies chest pain with inspiration.  GI      Complains of abdominal pain and nausea.      Denies vomiting blood.   Physical Exam  General:     alert.   Head:     normocephalic.   Mouth:      Coated tounge.   pharynx pink and moist, no posterior lymphoid hypertrophy, and white plaque(s).    Neck:     supple.   Lungs:     normal breath sounds.   Heart:     normal rate, regular rhythm, no murmur, no gallop, and no rub.   Abdomen:     soft, normal bowel sounds, no rebound tenderness, distended, hepatomegaly, and splenomegaly.   Extremities:     2+ left pedal edema and 2+ right pedal edema.   Neurologic:     alert & oriented X3.   Cervical Nodes:     no anterior cervical adenopathy and no posterior cervical adenopathy.      Impression & Recommendations:  Problem # 1:  ABDOMINAL ULTRASOUND, ABNORMAL (ICD-793.6) His biopsy was showing only necrotic cells. I talked with Dr. Okey Dupre and Dr. Shawnie Dapper from [facility] and per Dr. Shawnie Dapper I will get a repeat biopsy and also a CT with contrast of chest, abdomen and pelvis. I will  also get alfa feto protein and check a CBC to f/u Hb. I will refer him to Oncologist if either AFP is elevated or biopsy is positive for malignancy. Otherwise I will refer him to GI. [name] doesn't want more pain medication at present. I had a long discussion with him and his sister regarding the paln of care. Orders: CT with Contrast (CT w/ contrast) T-HIV Antibody  (Reflex) ([id number]) CT (CT)   Problem # 2:  HYPERTENSION (ICD-401.9) As mentioned earlier in Dr. Roxy Cedar note I will not start the pt. on any BP meds at this setting when he is in pain. I will address it later when the pt. is painfree.  Complete Medication List: 1)  Clozaril 100 Mg Tabs (Clozapine) .... Take 3 tabs at bedtime 2)  Benztropine Mesylate 0.5 Mg Tabs (Benztropine mesylate) .... Take 1 tablet by mouth two times a day 3)  Fluphenazine Hcl 1 Mg Tabs (Fluphenazine hcl) .... Take 1 tab by mouth at bedtime 4)  Protonix 40 Mg Tbec (Pantoprazole sodium) .... Take 1 tablet by mouth once a day 5)  Senokot S 8.6-50 Mg Tabs (Sennosides-docusate sodium) .... Take 1 tab by mouth at bedtime 6)  Morphine Sulfate 15 Mg Tabs (Morphine sulfate) .... Take 1 tablet by mouth every 4 hours as  needed for pain. 7)  Megace Oral 40 Mg/ml Susp (Megestrol acetate) .... Take 800 mg by mouth once daily  Other Orders: T- * Misc. Laboratory test [id number]) T-CBC No Diff ([id number])   Patient Instructions: 1)  Please schedule a follow-up appointment in 1-2 weeks.    ]

## 2010-05-26 NOTE — Consult Note (Signed)
Summary: [facility]  [facility]   Imported By: Louretta Parma 01/19/2010 16:00:05  _____________________________________________________________________  External Attachment:    Type:   Image     Comment:   External Document

## 2010-05-26 NOTE — Assessment & Plan Note (Signed)
Summary: SHOULDER/ SIDE/ (YOUNG)SB.   Vital Signs:  Patient Profile:   50 Years Old Male Height:     66 inches (167.64 cm) Weight:      191.05 pounds (86.84 kg) BMI:     30.95 Temp:     98.1 degrees F (36.72 degrees C) oral Pulse rate:   115 / minute BP sitting:   127 / 81  (left arm)  Pt. in pain?   no  Vitals Entered By: Angelina Ok RN (March 02, 2007 2:10 PM)              Is Patient Diabetic? No Nutritional Status BMI of > 30 = obese  Does patient need assistance? Functional Status Self care Ambulation Normal     Chief Complaint:  Check up urine is dark.  History of Present Illness: Urine is dark which started 2-3 weeks ago.  He describes it as yellowish-brown.  No trouble urinating.  No pain with urination.  Has a strong odor.  He has had a previous UTI a couple of years ago.  He denies any fever and chills.  No dysuria.  No genital sores.    He did get his colonoscopy done.  I don't have the results we will have to get those from Dr. Madilyn Fireman.  The patient thinks it was normal because he was not told anything about the results.  As for the side pain that has resolved.  Current Allergies: No known allergies   Past Medical History:    Hepatitis C:  not treatable    Hypertension    psychiatric disorder-schizophrenia    Hemorrhoids    rectal bleeding    Risk Factors: Tobacco use:  quit    Year quit:  8 years   Review of Systems  The patient denies anorexia, fever, weight loss, chest pain, syncope, abdominal pain, melena, and genital sores.     Physical Exam  General:     alert and well-developed.   Genitalia:     uncircumcised, no testicular masses or atrophy, no cutaneous lesions, and no urethral discharge.      Impression & Recommendations:  Problem # 1:  HYPERTENSION (ICD-401.9) Told him to stop taking the lisinopril for now and to follow up next week.  His updated medication list for this problem includes:    Lisinopril 20 Mg Tabs  (Lisinopril) .Marland Kitchen... Take 1 tablet by mouth once a day   Problem # 2:  DARK URINE (ICD-788.69) I will get a u/a to begin with but he is having trouble giving [place] a sample. Will have him drink some water and wait to see if he can give [place] a sample.  His sample was very concentrated and as per his sister who came later said he had been hospitalized in the past for sever dehydration because he doesn't drink enough fluids.  I expressed the importance of him drinking water on a regular basis even if he was not thirsty and told them he could go home if he promised that he would drink at least a gallon of water this weekend and come back in next week.  D/W Dr. Lyda Perone Orders: T-Urinalysis Dipstick only ([id number]) T-CBC w/Diff [id number]) T-Comprehensive Metabolic Panel [id number])  Future Orders: T-Comprehensive Metabolic Panel ([id number]) ... 03/06/2007 T-Urine Microalbumin w/creat. ratio [id number] / [id number]) ... 03/06/2007 T-Urinalysis ([id number]) ... 03/06/2007   Complete Medication List: 1)  Clozaril 100 Mg Tabs (Clozapine) .... Take 3 tabs at bedtime 2)  Benztropine Mesylate 0.5 Mg Tabs (Benztropine  mesylate) .... Take 1 tablet by mouth two times a day 3)  Fluphenazine Hcl 1 Mg Tabs (Fluphenazine hcl) .... Take 1 tab by mouth at bedtime 4)  Lisinopril 20 Mg Tabs (Lisinopril) .... Take 1 tablet by mouth once a day  Other Orders: Influenza Vaccine MCR ([id number])   Patient Instructions: 1)  Please schedule a follow-up appointment for next week. 2)  Drink plenty of water over the Friday and the weekend (1 gallon but no more than 1 1/2 gallons).  You don't have to drink the water quickly just spread it over the next couple of days. 3)  Stop taking your lisinopril.    ]  Influenza Vaccine    Vaccine Type: Fluvax MCR    Site: left deltoid    Mfr: novartis    Dose: 0.5 ml    Route: IM    Given by: Angelina Ok RN    Exp. Date: 10/24/2007    Lot #: [id number]    VIS given: 03/02/07  Flu  Vaccine Consent Questions    Do you have a history of severe allergic reactions to this vaccine? no    Any prior history of allergic reactions to egg and/or gelatin? no    Do you have a sensitivity to the preservative Thimersol? no    Do you have a past history of Guillan-Barre Syndrome? no    Do you currently have an acute febrile illness? no    Have you ever had a severe reaction to latex? no    Vaccine information given and explained to patient? yes    Laboratory Results   Urine Tests  Date/Time Recieved: March 02, 2007 4:21 PM Date/Time Reported: ..................................................................Marland KitchenAlric Quan  March 02, 2007 4:21 PM  Routine Urinalysis   Color: yellow Appearance: Hazy Glucose: 100   (Normal Range: Negative) Bilirubin: small   (Normal Range: Negative) Ketone: negative   (Normal Range: Negative) Spec. Gravity: >=1.030   (Normal Range: 1.003-1.035) Blood: trace-lysed   (Normal Range: Negative) pH: 5.0   (Normal Range: 5.0-8.0) Protein: 100   (Normal Range: Negative) Urobilinogen: 2.0   (Normal Range: 0-1) Nitrite: negative   (Normal Range: Negative) Leukocyte Esterace: small   (Normal Range: Negative)

## 2010-05-26 NOTE — Miscellaneous (Signed)
  Clinical Lists Changes  Orders: Added new Test order of T-Comprehensive Metabolic Panel [id number]) - Signed Added new Test order of T-Protime, Auto ([id number]) - Signed

## 2010-05-26 NOTE — Consult Note (Signed)
[facility]   Imported By: Florinda Marker 01/26/2008 14:47:27  _____________________________________________________________________  External Attachment:    Type:   Image     Comment:   External Document  Appended Document: [facility]  Pt currenly on CHOP/rituxan.  Pt with stage 4 disease.   Rufina Falco MD  January 26, 2008 2:50 PM   Clinical Lists Changes  Problems: Added new problem of NON-HODGKIN'S LYMPHOMA (ICD-202.80) - Stage 4.  Seen by Dr. Tanja Port

## 2010-05-26 NOTE — Assessment & Plan Note (Signed)
Summary: (ACUTE-YOUNG)LEG PAIN/CH   Vital Signs:  Patient profile:   50 year old male Height:      66 inches (167.64 cm) Weight:      173.7 pounds (78.95 kg) BMI:     28.14 Temp:     97.4 degrees F oral Pulse rate:   76 / minute BP sitting:   124 / 75  (right arm)  Vitals Entered By: Chinita Pester RN (July 09, 2008 10:16 AM) Is Patient Diabetic? No Pain Assessment Patient in pain? yes      Intensity: 7 Type: burning Onset of pain  Constant; toesnumb Nutritional Status BMI of 25 - 29 = overweight  Have you ever been in a relationship where you felt threatened, hurt or afraid?Unable to ask; someone w/pt.   Does patient need assistance? Functional Status Self care Ambulation Normal Comments uses a cane Bilateral leg pain/swelling   History of Present Illness: Pt is a 50 year old man with PMH significant for Non Hodgkin's lymphoma S/P CHOP plus Rituxin and in remission, internal hemorrhoids, schizophreniia, HTN, and Hep C who presents to the clinic for follow up of his leg pain and swelling. He notes that the swelling is unchanged, as well as the pain. The pain is intermittently worse, but always there. It is worse specifically with exercise, and is located in his calves and ankles. Elevating his legs helps somewhat with the swelling, but not the pain. He states that his next pet scan is in about a month.  He notes that he has begun developing new skin lesions. This first developed about a year ago after the chemo. His last round of chemo was december of 2009. His chemo began in March of 2009, around when the skin lesions began. They first began on his left hand. THey are small and dark black, and are raised. They do not itch, and they have no discharge. At the same time he developed a similar lesion on his backside just above his anal sulcus. This lesion is larger however. Since they developed they have not changed, and they are all the same age.  He continues to have generalized  itching with no specific rash that he sees, on his back, and abdomen.  He has a foot fungus in his nails for which he is being treated with a topical medication and haveing his nails debrided by a foot specialist. PMH and SH reviewed and amended.  Preventive Screening-Counseling & Management     Alcohol drinks/day: 0     Smoking Status: quit     Year Quit: 20 years     Does Patient Exercise: no  Current Medications (verified): 1)  Protonix 40 Mg  Tbec (Pantoprazole Sodium) .... Take 1 Tablet By Mouth Once A Day 2)  Furosemide 20 Mg Tabs (Furosemide) .... Take 1 Tablet By Mouth Once A Day 3)  Hydroxyzine Hcl 25 Mg Tabs (Hydroxyzine Hcl) .... Take 1 Tablet By Mouth As Needed For Itching. 4)  Zyprexa 20 Mg Tabs (Olanzapine) .... Take 1 Tab By Mouth At Bedtime 5)  Clonazepam 0.5 Mg Tabs (Clonazepam) .... Take 1 Tablet By Mouth Once A Day  Allergies: No Known Drug Allergies  Past History:  Past medical, surgical, family and social histories (including risk factors) reviewed, and no changes noted (except as noted below).  Past Medical History:    Reviewed history from 06/06/2008 and no changes required:    Hepatitis C:  not treatable    Hypertension    psychiatric disorder-schizophrenia  Hemorrhoids    rectal bleeding    Abdominal pain    Non Hodgkins Lymphoma:  Status post CHOP and Rituximab therapy : currently in remission.   Family History:    Reviewed history and no changes required:  Social History:    Reviewed history and no changes required:  Review of Systems General:  Denies chills, fatigue, and fever. CV:  Complains of leg cramps with exertion and swelling of feet; denies difficulty breathing at night, difficulty breathing while lying down, lightheadness, palpitations, and shortness of breath with exertion. Resp:  Denies chest discomfort and cough. GI:  Denies abdominal pain and change in bowel habits. GU:  Denies dysuria, urinary frequency, and urinary  hesitancy. Neuro:  Denies numbness and visual disturbances. Psych:  Denies anxiety, depression, and irritability.  Physical Exam  General:  alert, well-developed, well-nourished, and well-hydrated.   Lungs:  normal respiratory effort, no intercostal retractions, no accessory muscle use, normal breath sounds, no crackles, and no wheezes.   Heart:  normal rate, regular rhythm, no murmur, and no gallop.   Abdomen:  soft, non-tender, and normal bowel sounds.   Neurologic:  alert & oriented X3.   Psych:  Oriented X3, normally interactive, good eye contact, not anxious appearing, and not depressed appearing.     Impression & Recommendations:  Problem # 1:  LEG EDEMA, BILATERAL (ICD-782.3) I will check a venous duplex to make sure he does not have a DVT. I reviewed his normal echo. Will also check a microalbumin/creatinine ratio, though I think his swelling may be related to the prior chemo. Will follow results of his labs. His updated medication list for this problem includes:    Lasix 40 Mg Tabs (Furosemide) .Marland Kitchen... Take 1 tablet by mouth once a day  Orders: LE Venous Duplex (DVT) (DVT) T-Urine Microalbumin w/creat. ratio [phone] / [id number])  Problem # 2:  HYPERTENSION (ICD-401.9) Has not been an issue, may no longer be a problem based on his only being on low dose furosemide, and only for the leg edema, with a normal 2D echo His updated medication list for this problem includes:    Lasix 40 Mg Tabs (Furosemide) .Marland Kitchen... Take 1 tablet by mouth once a day  BP today: 124/75 Prior BP: 114/71 (06/06/2008)  Labs Reviewed: Creat: 0.76 (06/14/2008)  Problem # 3:  HEPATITIS C (ICD-070.51)  Persistanly mildly elevated LFTs, though he is on a topical anti-fungal for his feet. Will recheck, but if does not continue to rise, I would not make any changes. Will also check a PT to eval intrinsic liver function  Orders: T-Comprehensive Metabolic Panel ([id number]) T-CBC w/Diff  ([id number]) T-Protime, Auto ([id number])  Problem # 4:  SKIN LESIONS, MULTIPLE (ICD-709.9)  Multiple papules on his arms, legs, and buttocks that are similar in size, age, and consistency. Discussed with his oncologist and Dr. Landis Martins and agreed that a Dermatology referral for biopsy is warranted to confirm that he does not have a skin cancer. His skin lesions are not familiar to me, and hopefully the dermatologist along with biopsy will confirm a benign nature. Mr. Pelfrey agreed with this plan. A total of 30 minutes was spent managing Mr. Rajewski including a phone conversation with his primary Oncologist.  Orders: Dermatology Referral (Derma)  Problem # 5:  PANCYTOPENIA (ICD-284.1) Recheck. Noted low white count but s/p chemo. No sign of thrush, prior negative HIV test. In retrospect, we may consider screening for syphilis at his next visit. Orders: T-CBC w/Diff ([id number])  Complete Medication List: 1)  Protonix 40 Mg Tbec (Pantoprazole sodium) .... Take 1 tablet by mouth once a day 2)  Lasix 40 Mg Tabs (Furosemide) .... Take 1 tablet by mouth once a day 3)  Hydroxyzine Hcl 25 Mg Tabs (Hydroxyzine hcl) .... Take 1 tablet by mouth as needed for itching. 4)  Zyprexa 20 Mg Tabs (Olanzapine) .... Take 1 tab by mouth at bedtime 5)  Clonazepam 0.5 Mg Tabs (Clonazepam) .... Take 1 tablet by mouth once a day 6)  Ultram 50 Mg Tabs (Tramadol hcl) .... Take one tab by mouth once every 8 hours as needed for pain  Patient Instructions: 1)  Please keep the appoinment provided with the Dermatologist. 2)  Please obtain the ultrasound of the legs. We will contact you if the labs or ultrasound are concerning. Prescriptions: ULTRAM 50 MG TABS (TRAMADOL HCL) Take one tab by mouth once every 8 hours as needed for pain  #90 x 1   Entered and Authorized by:   Valetta Close MD   Signed by:   Valetta Close MD on 07/09/2008   Method used:   Print then Give to Patient   RxID:   [id number] LASIX 40  MG TABS (FUROSEMIDE) Take 1 tablet by mouth once a day  #31 x 3   Entered and Authorized by:   Valetta Close MD   Signed by:   Valetta Close MD on 07/09/2008   Method used:   Electronically to        [facility] [address] (retail)       [address]       [place], [place]  [address]       Ph: [phone] or [phone]       Fax: [phone]   RxID:   [id number]  Lasix Rx called to [facility] pharmacy [phone] per pt.   Chinita Pester RN  July 09, 2008 12:10 PM

## 2010-05-26 NOTE — Consult Note (Signed)
[facility]   Imported By: Florinda Marker 01/03/2009 16:22:09  _____________________________________________________________________  External Attachment:    Type:   Image     Comment:   External Document

## 2010-05-26 NOTE — Miscellaneous (Signed)
Summary: HIPAA Restrictions  HIPAA Restrictions   Imported By: Florinda Marker 06/06/2008 15:48:28  _____________________________________________________________________  External Attachment:    Type:   Image     Comment:   External Document

## 2010-05-26 NOTE — Assessment & Plan Note (Signed)
Summary: CHECKUP/ SB.   Vital Signs:  Patient Profile:   50 Years Old Male Height:     66 inches (167.64 cm) Weight:      176.3 pounds (80.14 kg) BMI:     28.56 Temp:     99.7 degrees F (37.61 degrees C) oral Pulse rate:   125 / minute BP sitting:   136 / 84  (right arm) Cuff size:   large  Pt. in pain?   no  Vitals Entered By: Theotis Barrio (June 08, 2007 3:39 PM)              Is Patient Diabetic? No Nutritional Status NORMAL  Have you ever been in a relationship where you felt threatened, hurt or afraid?N0   Does patient need assistance? Functional Status Self care Ambulation Normal     Chief Complaint:  ABD PAIN X2 WEEKS / ALSO COMPLAIN OF BACK -SIDE- CHEST PAIN / MED REFILL.  History of Present Illness: Pt is a 50 year old man with PMH significant for Hepatitis C, constipation, schizophrenia,  and hemorrhoids presenting to clinic for follow up for abdominal pain.  Pt was seen on 05/18/50 with same complaints of diffuse abdominal pain, worse in RUQ, that is described as aching, intermitent, not related to food, or movement, associated with nausea,  and occas. radiates to back.  Pt denies vomiting, hematochezia, frank melena, fevers, chills, inability to keep foods down, sick contacts, CP, dysuria, hematuria, and discharge.  The work up for the last visit included a CMET which showed mildly elevated AST, ALT, Alk phos,  a lipase which was normal, and CBC which showed mild anemia, and FOBT negative.   A ultrasound of the abdomen was also ordered but pt did not have done.      Updated Prior Medication List: CLOZARIL 100 MG TABS (CLOZAPINE) take 3 tabs at bedtime BENZTROPINE MESYLATE 0.5 MG  TABS (BENZTROPINE MESYLATE) Take 1 tablet by mouth two times a day FLUPHENAZINE HCL 1 MG  TABS (FLUPHENAZINE HCL) Take 1 tab by mouth at bedtime PROTONIX 40 MG  TBEC (PANTOPRAZOLE SODIUM) Take 1 tablet by mouth once a day SENOKOT S 8.6-50 MG  TABS (SENNOSIDES-DOCUSATE SODIUM) Take 1  tab by mouth at bedtime ULTRAM 50 MG  TABS (TRAMADOL HCL) Take 1 tablet by mouth two times a day  Current Allergies (reviewed today): No known allergies   Past Medical History:    Hepatitis C:  not treatable    Hypertension    psychiatric disorder-schizophrenia    Hemorrhoids    rectal bleeding    Abdominal pain    Risk Factors:  Tobacco use:  quit    Year quit:  8 years Alcohol use:  no Exercise:  no Seatbelt use:  100 %   Review of Systems       See HPI   Physical Exam  General:     alert, well-developed, well-nourished, well-hydrated, and overweight-appearing.   Head:     normocephalic and atraumatic.   Neck:     supple.   Lungs:     normal respiratory effort, no intercostal retractions, no accessory muscle use, normal breath sounds, no crackles, and no wheezes.   Heart:     regular rhythm, no murmur, no gallop, no rub, and tachycardia.   Abdomen:     soft, normal bowel sounds, no masses, no guarding, no rigidity, no rebound tenderness, hepatomegaly, and RUQ tenderness (but pt also mildly tender diffusel)..   Msk:  normal ROM.   Pulses:     R and L carotid,radial,femoral,dorsalis pedis and posterior tibial pulses are full and equal bilaterally Extremities:     No clubbing, cyanosis, edema, or deformity noted with normal full range of motion of all joints.   Neurologic:     alert & oriented X3.   Skin:     turgor normal, color normal, no rashes, no suspicious lesions, and no edema.   Psych:     Oriented X3, good eye contact, not depressed appearing, and moderately anxious.      Impression & Recommendations:  Problem # 1:  ABDOMINAL PAIN, GENERALIZED (ICD-789.07) Differential includes hepatitits pain, cholecystitis, cholelithiasis, biliary colic, UTI, PUD, Gastritis, pancreatitis, etc...  Will check a lipase, CMET, U/A, urine C and S, abdominal ultrasound, CBC with Diff.  Will also give ultram with hepatic dosing for pain. Pt advised if her gets  worse with fevers, chills, worsening pain, melena, hematochezia, not being able to keep things down, etc... to come into the emergency room.  Pt to follow up in one week.  The importance of the ultrasound was stressed to pt and sister.  If ultrasound negative pt would probably benefit from CT.   Orders: T-CBC w/Diff [id number]) T-Comprehensive Metabolic Panel [id number]) T-Lipase [id number]) T-Urinalysis ([id number]) T-Culture, Urine ([id number])   Problem # 2:  TACHYCARDIA (ICD-785.0) Will check a TSH.  EKG done and reveals sinus tachycardia.  Pts increase heart rate could be secondary to pain.  Will reassess at follow up in one week. Orders: T-TSH ([id number])   Problem # 3:  HEPATITIS C (ICD-070.51) Will check a CMET. Orders: T-Comprehensive Metabolic Panel ([id number])   Problem # 4:  HYPERTENSION (ICD-401.9) BP is 136/84 and relatively well controlled given acute illness and pain.  Will monitor  Complete Medication List: 1)  Clozaril 100 Mg Tabs (Clozapine) .... Take 3 tabs at bedtime 2)  Benztropine Mesylate 0.5 Mg Tabs (Benztropine mesylate) .... Take 1 tablet by mouth two times a day 3)  Fluphenazine Hcl 1 Mg Tabs (Fluphenazine hcl) .... Take 1 tab by mouth at bedtime 4)  Protonix 40 Mg Tbec (Pantoprazole sodium) .... Take 1 tablet by mouth once a day 5)  Senokot S 8.6-50 Mg Tabs (Sennosides-docusate sodium) .... Take 1 tab by mouth at bedtime 6)  Ultram 50 Mg Tabs (Tramadol hcl) .... Take 1 tablet by mouth two times a day   Patient Instructions: 1)  Please schedule a follow-up appointment in 1 week. 2)  I will call you with any abnormal labs.  3)  Please make sure you go get the ultrasound of your abdomen. 4)  If you start getting worse, can't eat or drink without vomiting, having fevers, or chills, or notice blood in your stools please go to the emergency room immediately.     Prescriptions: ULTRAM 50 MG  TABS (TRAMADOL HCL) Take 1 tablet by mouth two  times a day  #28 x 0   Entered and Authorized by:   Rufina Falco MD   Signed by:   Rufina Falco MD on 06/08/2007   Method used:   Handwritten   RxID:   [id number]  ]

## 2010-05-26 NOTE — Consult Note (Signed)
[facility]   Imported By: Florinda Marker 05/28/2008 12:06:58  _____________________________________________________________________  External Attachment:    Type:   Image     Comment:   External Document

## 2010-05-26 NOTE — Miscellaneous (Signed)
Summary: [facility] : [facility]  [facility] : [facility]   Imported By: Florinda Marker 06/29/2007 15:28:31  _____________________________________________________________________  External Attachment:    Type:   Image     Comment:   External Document

## 2010-05-26 NOTE — Assessment & Plan Note (Signed)
Summary: PER DR Areej Tayler TO SEE/VS   Vital Signs:  Patient Profile:   50 Years Old Male Height:     66 inches (167.64 cm) Weight:      176.0 pounds Temp:     97.2 degrees F oral Pulse rate:   106 / minute BP sitting:   141 / 92  (right arm)  Vitals Entered By: Filomena Jungling (June 13, 2007 1:39 PM)             Is Patient Diabetic? No  Does patient need assistance? Functional Status Self care Ambulation Impaired:Risk for fall     Chief Complaint:  FOLLOW-UPVISIT AFTER X-RAYS.  History of Present Illness: Mr Dacanay is a 50 yo AA man with schizophrenia and Hepatitis C who is a patient of Dr Rufina Falco. He had an [place] today for evaluation of abdominal pain. The US showed probably mets in liver, spleen and LN's. I am seeing the pt to go over these results with him and arrange biopsy. Mr Marolf's sister is with him today. The pt's only complaint is the abdominal pain for which he was prescribed tramadol. The pt says this does not effectively ease his pain. His sister is worried about his decreased appetite. Of note, the patient seems to have marginal mental ability and has difficulty answering questions. His sister helps with communication.    Current Allergies: No known allergies     Risk Factors: Tobacco use:  quit    Year quit:  8 years Alcohol use:  no Exercise:  no Seatbelt use:  100 %      Impression & Recommendations:  Problem # 1:  ABDOMINAL ULTRASOUND, ABNORMAL (ICD-793.6) Most likely HCC in this pt with Hep C. Will schedule pt for CT guided bx. Checking coags prior to bx.  Note Hgb was 12.4 and Plts 409 K on 06/08/07. I gave Rx for morphine to take for pain and for megace to stimulate appetite. Future Orders: CT with Contrast (CT w/ contrast) ... 06/14/2007   Complete Medication List: 1)  Clozaril 100 Mg Tabs (Clozapine) .... Take 3 tabs at bedtime 2)  Benztropine Mesylate 0.5 Mg Tabs (Benztropine mesylate) .... Take 1 tablet by mouth two times a day 3)   Fluphenazine Hcl 1 Mg Tabs (Fluphenazine hcl) .... Take 1 tab by mouth at bedtime 4)  Protonix 40 Mg Tbec (Pantoprazole sodium) .... Take 1 tablet by mouth once a day 5)  Senokot S 8.6-50 Mg Tabs (Sennosides-docusate sodium) .... Take 1 tab by mouth at bedtime 6)  Morphine Sulfate 15 Mg Tabs (Morphine sulfate) .... Take 1 tablet by mouth every 4 hours as needed for pain. 7)  Megace Oral 40 Mg/ml Susp (Megestrol acetate) .... Take 800 mg by mouth once daily  Other Orders: T-PTT ([id number]) T-Protime, Auto ([id number])   Patient Instructions: 1)  We will call you with the appointment time for your CT scan and biopsy. 2)  Send in a request for a home aide to the attention of Dr Rufina Falco.    Prescriptions: MEGACE ORAL 40 MG/ML  SUSP (MEGESTROL ACETATE) Take 800 mg by mouth once daily  #1 mo supply x 3   Entered and Authorized by:   Ned Grace MD   Signed by:   Harriett Sine Alvita Fana MD on 06/13/2007   Method used:   Print then Give to Patient   RxID:   [id number] MORPHINE SULFATE 15 MG  TABS (MORPHINE SULFATE) Take 1 tablet by mouth every 4 hours as needed for  pain.  #60 x 0   Entered and Authorized by:   Harriett Sine Joey Hudock MD   Signed by:   Ned Grace MD on 06/13/2007   Method used:   Print then Give to Patient   RxID:   [id number]  ]

## 2010-05-26 NOTE — Progress Notes (Signed)
Summary: Elevated LFT's  Phone Note Outgoing Call   Call placed by: Angelina Ok RN,  June 13, 2008 10:15 AM Call placed to: Patient Summary of Call: Call to pt to inform him that his Liver Function Test are elevated.  Pt was asked if he is continuing to drink-pt stated no.  Pt was informed that with his Hepatitis he should not be drinking.  Pt was informed that he will need to have additional labs done.  Pt said that he could come in for laba on tommorrow or Monday. Angelina Ok RN  June 13, 2008 10:18 AM  Initial call taken by: Angelina Ok RN,  June 13, 2008 10:18 AM  Follow-up for Phone Call        Thanks  Follow-up by: Riki Rusk      Appended Document: Orders Update    Clinical Lists Changes  Orders: Added new Test order of T-Comprehensive Metabolic Panel [id number]) - Signed

## 2010-05-26 NOTE — Assessment & Plan Note (Signed)
Summary: F/U/EST/VS   Vital Signs:  Patient profile:   50 year old male Height:      66.5 inches (168.91 cm) Weight:      180.8 pounds (82.18 kg) BMI:     28.85 Temp:     97.0 degrees F oral Pulse rate:   73 / minute BP sitting:   122 / 78  (left arm)  Vitals Entered By: Chinita Pester RN (October 14, 2008 11:22 AM) CC: F/U visit; test result Is Patient Diabetic? No Pain Assessment Patient in pain? no      Nutritional Status BMI of 25 - 29 = overweight  Have you ever been in a relationship where you felt threatened, hurt or afraid?Unable to ask ; someone w/pt   Does patient need assistance? Functional Status Self care Ambulation Normal   CC:  F/U visit; test result.  History of Present Illness: Pt is a 50 year old man with PMH significant for Non hodgkins lymphoma, hepatitis C, HTN, and schizophrenia who presents to clinic for follow up of his GI visit.  Pt is to follow up with Dr. Madilyn Fireman on 6/25 to discuss further test results and for further management of his elevated LFT's and hepatitis C.  Hopefully he will send an office note prior to my last day.  He has no complaints at this time.  He reports his eczema is better with the cream provided by Dr. Terri Piedra.    Preventive Screening-Counseling & Management  Alcohol-Tobacco     Alcohol drinks/day: 0     Smoking Status: quit     Year Quit: 25 - 30 years  Caffeine-Diet-Exercise     Does Patient Exercise: no  Problems Prior to Update: 1)  Eczema  (ICD-692.9) 2)  Disseminated Superficial Actinic Porokeratosis  (ICD-692.75) 3)  Transaminases, Serum, Elevated  (ICD-790.4) 4)  Skin Lesions, Multiple  (ICD-709.9) 5)  Sbo  (ICD-560.9) 6)  Pancytopenia  (ICD-284.1) 7)  Urinary Tract Infection  (ICD-599.0) 8)  Leg Edema, Bilateral  (ICD-782.3) 9)  Non-hodgkin's Lymphoma  (ICD-202.80) 10)  Lymphadenopathy, Diffuse  (ICD-785.6) 11)  Abdominal Ultrasound, Abnormal  (ICD-793.6) 12)  Abdominal Pain, Generalized   (ICD-789.07) 13)  Dark Urine  (ICD-788.69) 14)  Internal Hemorrhoids  (ICD-455.0) 15)  Rectal Bleeding  (ICD-569.3) 16)  Hemorrhoids, External  (ICD-455.3) 17)  Tachycardia  (ICD-785.0) 18)  Schizophrenia  (ICD-295.90) 19)  Hypertension  (ICD-401.9) 20)  Hepatitis C  (ICD-070.51)  Medications Prior to Update: 1)  Protonix 40 Mg  Tbec (Pantoprazole Sodium) .... Take 1 Tablet By Mouth Once A Day 2)  Lasix 40 Mg Tabs (Furosemide) .... Take 1 Tablet By Mouth Once A Day 3)  Hydroxyzine Hcl 25 Mg Tabs (Hydroxyzine Hcl) .... Take 1 Tablet By Mouth Every 6 Hours As Needed For Itching. 4)  Clonazepam 0.5 Mg Tabs (Clonazepam) .... Take 1 Tablet By Mouth Once A Day 5)  Ultram 50 Mg Tabs (Tramadol Hcl) .... Take One Tab By Mouth Once Every 8 Hours As Needed For Pain 6)  Zyprexa 20 Mg Tabs (Olanzapine) .... Take 1 Tablet By Mouth Once A Day 7)  Klor-Con 20 Meq Pack (Potassium Chloride) .... Take 1 Tablet By Mouth Once A Day 8)  Hydroxyzine Hcl 10 Mg Tabs (Hydroxyzine Hcl) .... Take One Tab By Mouth Once Every 6 Hours As Needed For Itching  Current Medications (verified): 1)  Protonix 40 Mg  Tbec (Pantoprazole Sodium) .... Take 1 Tablet By Mouth Once A Day 2)  Lasix 40 Mg Tabs (  Furosemide) .... Take 1 Tablet By Mouth Once A Day 3)  Hydroxyzine Hcl 25 Mg Tabs (Hydroxyzine Hcl) .... Take 1 Tablet By Mouth Every 6 Hours As Needed For Itching. 4)  Clonazepam 0.5 Mg Tabs (Clonazepam) .... Take 1 Tablet By Mouth Once A Day 5)  Ultram 50 Mg Tabs (Tramadol Hcl) .... Take One Tab By Mouth Once Every 8 Hours As Needed For Pain 6)  Zyprexa 20 Mg Tabs (Olanzapine) .... Take 1 Tablet By Mouth Once A Day 7)  Klor-Con 20 Meq Pack (Potassium Chloride) .... Take 1 Tablet By Mouth Once A Day 8)  Hydroxyzine Hcl 10 Mg Tabs (Hydroxyzine Hcl) .... Take One Tab By Mouth Once Every 6 Hours As Needed For Itching  Allergies (verified): No Known Drug Allergies  Past History:  Past Medical History: Last updated:  06/06/2008 Hepatitis C:  not treatable Hypertension psychiatric disorder-schizophrenia Hemorrhoids rectal bleeding Abdominal pain Non Hodgkins Lymphoma:  Status post CHOP and Rituximab therapy : currently in remission.   Risk Factors: Alcohol Use: 0 (10/14/2008) Exercise: no (10/14/2008)  Risk Factors: Smoking Status: quit (10/14/2008)  Review of Systems  The patient denies fever, chest pain, dyspnea on exertion, headaches, abdominal pain, hematuria, suspicious skin lesions, unusual weight change, and enlarged lymph nodes.    Physical Exam  General:  alert, well-developed, well-nourished, and well-hydrated.   Mouth:  pharynx pink and moist.   Neck:  supple, no masses, no cervical lymphadenopathy, and no neck tenderness.   Lungs:  normal respiratory effort, no intercostal retractions, no accessory muscle use, normal breath sounds, no crackles, and no wheezes.   Heart:  normal rate, regular rhythm, no murmur, no gallop, no rub, and no JVD.   Abdomen:  soft, non-tender, and normal bowel sounds.   Extremities:  No clubbing, cyanosis, edema, or deformity noted with normal full range of motion of all joints.   Neurologic:  alert & oriented X3, cranial nerves II-XII intact, strength normal in all extremities, sensation intact to light touch, and gait normal.   Cervical Nodes:  no anterior cervical adenopathy and no posterior cervical adenopathy.   Axillary Nodes:  no R axillary adenopathy and no L axillary adenopathy.   Psych:  Oriented X3, normally interactive, good eye contact, not anxious appearing, not depressed appearing, and not agitated.     Impression & Recommendations:  Problem # 1:  ECZEMA (ICD-692.9) Improved.  Per Dermatology.    Problem # 2:  TRANSAMINASES, SERUM, ELEVATED (ICD-790.4) Mr. Arizpe is to see Dr. Madilyn Fireman again on June 25th to go over his test results and to decide on what to do next.  Hopefully his office note will come back before I leave.    Problem # 3:   SCHIZOPHRENIA (ICD-295.90) Stable.  Per behavioral health.  Continue zyprexa.    Problem # 4:  HYPERTENSION (ICD-401.9) Good control.  No change to his medications.  His updated medication list for this problem includes:    Lasix 40 Mg Tabs (Furosemide) .Marland Kitchen... Take 1 tablet by mouth once a day  Complete Medication List: 1)  Protonix 40 Mg Tbec (Pantoprazole sodium) .... Take 1 tablet by mouth once a day 2)  Lasix 40 Mg Tabs (Furosemide) .... Take 1 tablet by mouth once a day 3)  Hydroxyzine Hcl 25 Mg Tabs (Hydroxyzine hcl) .... Take 1 tablet by mouth every 6 hours as needed for itching. 4)  Clonazepam 0.5 Mg Tabs (Clonazepam) .... Take 1 tablet by mouth once a day 5)  Ultram 50 Mg  Tabs (Tramadol hcl) .... Take one tab by mouth once every 8 hours as needed for pain 6)  Zyprexa 20 Mg Tabs (Olanzapine) .... Take 1 tablet by mouth once a day 7)  Klor-con 20 Meq Pack (Potassium chloride) .... Take 1 tablet by mouth once a day 8)  Hydroxyzine Hcl 10 Mg Tabs (Hydroxyzine hcl) .... Take one tab by mouth once every 6 hours as needed for itching  Patient Instructions: 1)  Please schedule a follow-up appointment in 1-2 months. 2)  Call if you have any problems or concerns.   3)  Let us know what Dr. Madilyn Fireman says when you see him on Friday.  4)  Good luck to you.  It was a pleasure helping take care of you.   Prescriptions: LASIX 40 MG TABS (FUROSEMIDE) Take 1 tablet by mouth once a day  #31 x 3   Entered and Authorized by:   Rufina Falco MD   Signed by:   Rufina Falco MD on 10/14/2008   Method used:   Electronically to        [facility] (retail)       [address]       [place], [place]  [id number]       Ph: [phone]       Fax: [phone]   RxID:   [id number]

## 2010-05-26 NOTE — Assessment & Plan Note (Signed)
Summary: est-ck/fu/meds/cfb   Vital Signs:  Patient profile:   50 year old male Height:      66.5 inches (168.91 cm) Weight:      209.6 pounds (95.27 kg) BMI:     33.44 Temp:     96.6 degrees F (35.89 degrees C) oral Pulse rate:   70 / minute BP sitting:   127 / 81  (right arm)  Vitals Entered By: Stanton Kidney Ditzler RN (June 18, 2009 2:35 PM) Is Patient Diabetic? No Pain Assessment Patient in pain? no      Nutritional Status BMI of > 30 = obese Nutritional Status Detail appetite good  Have you ever been in a relationship where you felt threatened, hurt or afraid?denies   Does patient need assistance? Functional Status Self care Ambulation Normal Comments FU - doing well. ? refills on meds. Swelling in both feet - better.   History of Present Illness: 50 yo man with PMH as listed below who presents for followup:    Schizophrenia: follows mental health; last seen monday. Still hearing voices but says that he is actively managed by mental health and is happy with the care he is receiving.    Hepatitis C: Last seen in liver clinic 03-17-2009 with Dr. Madilyn Fireman and was told that therapy for Hep C was not recommended in light of the probably poor response to therapy, hx of lymphoma, hx of schizophrenia. He was told to fu in 6 months for repeat labs with Dr. Madilyn Fireman. The patient is aware of this plan.   Non-Hodkins lymphoma: Follows with Dr. Shirline Frees of Hem/onc. In remission. S/p CHOP and rituximab. On observation since September 2009 with no recurrence. Plan in 12-24-2008 at his last onc appointment was repeat CT neck, chest, abdomen, pelvis in 6 months. Last CT 12-17-2008 was negative. Also had negative PET August 2010. Has next oncology followup with scans tomorrow.    HTN: Well-controlled in the past. Only med is Lasix that he uses for swelling.    LE edema: Seen 11-2008 and had LE [place] that showed no DVT. He also had normal echo March 2010. His leg swelling has improved since then. He  says it is completely gone with Lasix use.   Preventive Health: Got flu shot today. Says last tetanus was 1-2 years ago. Says he had a normal colonoscopy 2 years ago at [facility]. I cannot see this report in the system. WIll request it as I have been informed that these reports from [facility] do not always flow into Echart.   Depression History:      The patient denies a depressed mood most of the day and a diminished interest in his usual daily activities.         Preventive Screening-Counseling & Management  Alcohol-Tobacco     Alcohol drinks/day: 0     Smoking Status: quit     Year Quit: 25 - 30 years  Caffeine-Diet-Exercise     Does Patient Exercise: no  Problems Prior to Update: 1)  Eczema  (ICD-692.9) 2)  Disseminated Superficial Actinic Porokeratosis  (ICD-692.75) 3)  Transaminases, Serum, Elevated  (ICD-790.4) 4)  Skin Lesions, Multiple  (ICD-709.9) 5)  Sbo  (ICD-560.9) 6)  Pancytopenia  (ICD-284.1) 7)  Urinary Tract Infection  (ICD-599.0) 8)  Leg Edema, Bilateral  (ICD-782.3) 9)  Non-hodgkin's Lymphoma  (ICD-202.80) 10)  Lymphadenopathy, Diffuse  (ICD-785.6) 11)  Abdominal Ultrasound, Abnormal  (ICD-793.6) 12)  Abdominal Pain, Generalized  (ICD-789.07) 13)  Dark Urine  (ICD-788.69) 14)  Internal  Hemorrhoids  (ICD-455.0) 15)  Rectal Bleeding  (ICD-569.3) 16)  Hemorrhoids, External  (ICD-455.3) 17)  Tachycardia  (ICD-785.0) 18)  Schizophrenia  (ICD-295.90) 19)  Hypertension  (ICD-401.9) 20)  Hepatitis C  (ICD-070.51)  Current Medications (verified): 1)  Lasix 40 Mg Tabs (Furosemide) .... Take 2 Tablet By Mouth Once A Day 2)  Hydroxyzine Hcl 25 Mg Tabs (Hydroxyzine Hcl) .... Take 1 Tablet By Mouth Every 6 Hours As Needed For Itching. 3)  Zyprexa 20 Mg Tabs (Olanzapine) .... Take 1 Tablet By Mouth Once A Day 4)  Klor-Con 20 Meq Pack (Potassium Chloride) .... Take 1 Tablet By Mouth Once A Day 5)  Hydroxyzine Hcl 10 Mg Tabs (Hydroxyzine Hcl) .... Take One Tab By Mouth Once  Every 6 Hours As Needed For Itching  Allergies (verified): No Known Drug Allergies  Review of Systems  The patient denies anorexia, fever, weight loss, weight gain, vision loss, decreased hearing, hoarseness, chest pain, syncope, dyspnea on exertion, prolonged cough, headaches, hemoptysis, abdominal pain, melena, hematochezia, severe indigestion/heartburn, hematuria, incontinence, genital sores, muscle weakness, suspicious skin lesions, transient blindness, difficulty walking, unusual weight change, and abnormal bleeding.         see HPI  Physical Exam  General:  alert, well-developed, well-nourished, and well-hydrated.   Head:  normocephalic and atraumatic.   Eyes:  vision grossly intact, pupils equal, pupils round, and pupils reactive to light.   Ears:  no external deformities.   Nose:  no external deformity.   Lungs:  normal respiratory effort, no accessory muscle use, normal breath sounds, no crackles, and no wheezes.   Heart:  normal rate, regular rhythm, no murmur, no gallop, and no rub.   Abdomen:  soft, non-tender, and normal bowel sounds.   Extremities:  Patient has trace LE edema bilaterally that he says is much improved compared to past visits.  Neurologic:  alert & oriented X3, cranial nerves II-XII intact, strength normal in all extremities, and sensation intact to light touch.   Psych:  Oriented X3, memory intact for recent and remote, normally interactive, good eye contact, not anxious appearing, and not depressed appearing.     Impression & Recommendations:  Problem # 1:  LEG EDEMA, BILATERAL (ICD-782.3) Much improved with Lasix regimen. Does still have trace edema. Per HPI had normal echo and normal LE [place] within last year. As he does have trace edema but it doing well with no complaints will keep his meds the same at this time.  Checking Bmet today.  His updated medication list for this problem includes:    Lasix 40 Mg Tabs (Furosemide) .Marland Kitchen... Take 2 tablet by mouth  once a day  Orders: T-Basic Metabolic Panel [id number])  Problem # 2:  NON-HODGKIN'S LYMPHOMA (ICD-202.80) See HPI. Has oncology appointment tomorrow. Will await report from this visit.   Problem # 3:  HEPATITIS C (ICD-070.51) Has known Hep C that is being followed by GI. Has upcoming appointment in a few months. WIll defer to them on management and liver labs as they are actively following this. Will await report from his next appointment.   Problem # 4:  RECTAL BLEEDING (ICD-569.3) He tells me he has had no rectal bleeding. He tells me that he had a normal colonoscopy from 2 years ago at [facility]. We are requesting this report.   Problem # 5:  SCHIZOPHRENIA (ICD-295.90) See HPI. This is being managed by mental health. WIll defer to them on this issue. He is still having some auditory hallucinations, but he  seems to be functioning well and is happy with the care he gets at mental health.   Problem # 6:  HYPERTENSION (ICD-401.9) At goal. Checking Bmet.   His updated medication list for this problem includes:    Lasix 40 Mg Tabs (Furosemide) .Marland Kitchen... Take 2 tablet by mouth once a day  Orders: T-Basic Metabolic Panel [id number])  Problem # 7:  Preventive Health Care (ICD-V70.0) Preventive Health: Got flu shot today. Says last tetanus was 1-2 years ago. Says he had a normal colonoscopy 2 years ago at [facility]. I cannot see this report in the system. WIll request it as I have been informed that these reports from [facility] do not always flow into Echart.   Complete Medication List: 1)  Lasix 40 Mg Tabs (Furosemide) .... Take 2 tablet by mouth once a day 2)  Hydroxyzine Hcl 25 Mg Tabs (Hydroxyzine hcl) .... Take 1 tablet by mouth every 6 hours as needed for itching. 3)  Zyprexa 20 Mg Tabs (Olanzapine) .... Take 1 tablet by mouth once a day 4)  Klor-con 20 Meq Pack (Potassium chloride) .... Take 1 tablet by mouth once a day 5)  Hydroxyzine Hcl 10 Mg Tabs (Hydroxyzine hcl) .... Take one tab by  mouth once every 6 hours as needed for itching  Other Orders: Influenza Vaccine MCR ([id number])  Patient Instructions: 1)  Please make a followup appointment in 3 months for regular followup. Please call sooner if needed.  Prescriptions: KLOR-CON 20 MEQ PACK (POTASSIUM CHLORIDE) Take 1 tablet by mouth once a day  #30 x 2   Entered and Authorized by:   Aris Lot MD   Signed by:   Aris Lot MD on 06/18/2009   Method used:   Print then Give to Patient   RxID:   [id number] LASIX 40 MG TABS (FUROSEMIDE) Take 2 tablet by mouth once a day  #60 x 2   Entered and Authorized by:   Aris Lot MD   Signed by:   Aris Lot MD on 06/18/2009   Method used:   Print then Give to Patient   RxID:   [id number]  Process Orders Check Orders Results:     [facility]: Check successful Tests Sent for requisitioning (June 19, 2009 7:09 AM):     06/18/2009: [facility] -- T-Basic Metabolic Panel [id number] (signed)    Prevention & Chronic Care Immunizations   Influenza vaccine: Fluvax MCR  (06/18/2009)    Tetanus booster: Not documented   Td booster deferral: Deferred  (06/18/2009)    Pneumococcal vaccine: Not documented  Colorectal Screening   Hemoccult: Not documented   Hemoccult action/deferral: Deferred  (06/18/2009)    Colonoscopy: Not documented   Colonoscopy action/deferral: Deferred  (06/18/2009)  Other Screening   PSA: Not documented   PSA action/deferral: Discussed-decision deferred  (06/18/2009)  Reports requested:   Last colonoscopy report requested.  Smoking status: quit  (06/18/2009)  Lipids   Total Cholesterol: Not documented   LDL: Not documented   LDL Direct: Not documented   HDL: Not documented   Triglycerides: Not documented  Hypertension   Last Blood Pressure: 127 / 81  (06/18/2009)   Serum creatinine: 0.83  (12/11/2008)   Serum potassium 3.6  (12/11/2008)    Hypertension flowsheet  reviewed?: Yes   Progress toward BP goal: At goal  Self-Management Support :    Patient will work on the following items until the next clinic visit to reach self-care goals:     Medications and  monitoring: take my medicines every day, bring all of my medications to every visit, weigh myself weekly  (06/18/2009)     Eating: drink diet soda or water instead of juice or soda, eat more vegetables, use fresh or frozen vegetables, eat foods that are low in salt, eat fruit for snacks and desserts, limit or avoid alcohol  (06/18/2009)     Activity: take a 30 minute walk every day  (06/18/2009)    Hypertension self-management support: Written self-care plan, Education handout, Resources for patients handout  (06/18/2009)   Hypertension self-care plan printed.   Hypertension education handout printed      Resource handout printed.   Nursing Instructions: Request report of last colonoscopy     Influenza Vaccine    Vaccine Type: Fluvax MCR    Site: left deltoid    Mfr: novartis    Dose: 0.5 ml    Route: IM    Given by: Stanton Kidney Ditzler RN    Exp. Date: 06/24/2009    Lot #: [id number]    VIS given: 11/17/06 version given June 18, 2009.  Flu Vaccine Consent Questions    Do you have a history of severe allergic reactions to this vaccine? no    Any prior history of allergic reactions to egg and/or gelatin? no    Do you have a sensitivity to the preservative Thimersol? no    Do you have a past history of Guillan-Barre Syndrome? no    Do you currently have an acute febrile illness? no    Have you ever had a severe reaction to latex? no    Vaccine information given and explained to patient? yes

## 2010-05-26 NOTE — Assessment & Plan Note (Signed)
Summary: (ACUTE-YOUNG)PER POKHAREL 1 WEEK F/U/CH   Vital Signs:  Patient Profile:   50 Years Old Male Height:     66 inches (167.64 cm) Weight:      172.3 pounds (78.32 kg) BMI:     27.91 Temp:     97.1 degrees F (36.17 degrees C) oral Pulse rate:   88 / minute BP sitting:   124 / 87  (right arm)  Pt. in pain?   yes    Location:   abdomen    Intensity:   8    Type:       aching  Vitals Entered By: Chinita Pester RN (July 03, 2007 11:49 AM)              Is Patient Diabetic? No Nutritional Status BMI of 25 - 29 = overweight  Have you ever been in a relationship where you felt threatened, hurt or afraid?Unable to ask;siste w/pt.   Does patient need assistance? Functional Status Self care Ambulation Normal     Chief Complaint:  Test results.  History of Present Illness: 50 yo M who presents for f/up regarding some tests he had done last week.  He does not have any new complaints.  On prompting when I noticed the white plaques in his mouth, he states that he noticed those a couple of days ago.  He has no pain with swallowing, but feels like food gets stuck lately.  He also still has abd pain, but feels that the medication regimen he's on right now is OK.  He is not eating well, but has only been on the Megace for one week.    Updated Prior Medication List: CLOZARIL 100 MG TABS (CLOZAPINE) take 3 tabs at bedtime BENZTROPINE MESYLATE 0.5 MG  TABS (BENZTROPINE MESYLATE) Take 1 tablet by mouth two times a day FLUPHENAZINE HCL 1 MG  TABS (FLUPHENAZINE HCL) Take 1 tab by mouth at bedtime PROTONIX 40 MG  TBEC (PANTOPRAZOLE SODIUM) Take 1 tablet by mouth once a day SENOKOT S 8.6-50 MG  TABS (SENNOSIDES-DOCUSATE SODIUM) Take 1 tab by mouth at bedtime MORPHINE SULFATE 15 MG  TABS (MORPHINE SULFATE) Take 1 tablet by mouth every 4 hours as needed for pain. MEGACE ORAL 40 MG/ML  SUSP (MEGESTROL ACETATE) Take 800 mg by mouth once daily NYSTATIN 100000 UNIT/ML  SUSP (NYSTATIN) Swish and  swallow 5 ml by mouth four times a day until the thrush is gone  Current Allergies (reviewed today): No known allergies   Past Medical History:    Reviewed history from 06/08/2007 and no changes required:       Hepatitis C:  not treatable       Hypertension       psychiatric disorder-schizophrenia       Hemorrhoids       rectal bleeding       Abdominal pain       Diffuse lymphadenopathy on CT of C/A/P - pathology pending    Risk Factors: Tobacco use:  quit    Year quit:  10  years Alcohol use:  no Exercise:  no Seatbelt use:  100 %   Review of Systems      See HPI   Physical Exam  General:     Protuberant abdomen, mild buccal and temporal wasting, but in no acute distress; somewhat slurred speech, but appropriate and cooperative throughout visit Mouth:     White plaques adherant to both sides of the tongue and buccal mucosa B/L.  Impression & Recommendations:  Problem # 1:  LYMPHADENOPATHY, DIFFUSE (ICD-785.6) This is currently in the process of being worked up, but seems to be more consistent with lymphoma as his AFP was essentially normal.  It seems that this all started when he had some abnormal LFTs, was found to be Hep C positive and had an abd [place] as part of his workup for the Hep C.  Several lesions were noted on the abd [place] that were concerning for HCC vs Lymphoma so he got a CT of the C/A/P which revealed diffuse LAD in his chest and abdomen.  The pt had an FNA on 06/30/07 of the left supracalvicular LN, the results of which are still pending.  I spoke to Dr. Luisa Hart, the pathologist in charge of his case, who stated that preliminary results indicate that the cells are malignant, but he is not sure what kind of cancer the pt has yet.  The flow cytometry was inconclusive and the IHC tests are pending.  Dr. Luisa Hart expects to have a final diagnosis in the next 2 days.  I told the pt I will call him and his sister with the results of the biopsy, but since we know the  cells are malignant, he will need to be referred to Oncology as soon as the diagnosis has been made.  I willl order this as well whenever the path results are available.  The pt expressed understanding. He is clearly immunocompromised, likely from the CA +/- the Hep C, which is why he is at risk for the thrush seen on exam.  He was given a RX for Nystatin S&S to use QID until his sx are gone.  He was told to call the clinic if his sx got worse or did not get better at which time he should probably be placed on PO Diflucan.  Of note, his HIV test was nonreactive.  Complete Medication List: 1)  Clozaril 100 Mg Tabs (Clozapine) .... Take 3 tabs at bedtime 2)  Benztropine Mesylate 0.5 Mg Tabs (Benztropine mesylate) .... Take 1 tablet by mouth two times a day 3)  Fluphenazine Hcl 1 Mg Tabs (Fluphenazine hcl) .... Take 1 tab by mouth at bedtime 4)  Protonix 40 Mg Tbec (Pantoprazole sodium) .... Take 1 tablet by mouth once a day 5)  Senokot S 8.6-50 Mg Tabs (Sennosides-docusate sodium) .... Take 1 tab by mouth at bedtime 6)  Morphine Sulfate 15 Mg Tabs (Morphine sulfate) .... Take 1 tablet by mouth every 4 hours as needed for pain. 7)  Megace Oral 40 Mg/ml Susp (Megestrol acetate) .... Take 800 mg by mouth once daily 8)  Nystatin 100000 Unit/ml Susp (Nystatin) .... Swish and swallow 5 ml by mouth four times a day until the thrush is gone   Patient Instructions: 1)  Please schedule a follow-up appointment in 1 month with Dr. Maple Hudson. 2)  Start using the mouthwash as directed below for the thrush in your mouth.  Call us if the thrush gets worse, does not go away after a week or you develop pain when swallowing. 3)  You will be called with the results of the biopsy and any further plans will be discussed with you over the phone.    Prescriptions: NYSTATIN 100000 UNIT/ML  SUSP (NYSTATIN) Swish and swallow 5 ml by mouth four times a day until the thrush is gone  #480 ml x 0   Entered and Authorized by:    Chauncey Reading DO   Signed by:  Chauncey Reading DO on 07/03/2007   Method used:   Electronically sent to ...       [facility]*       [address]       [place], [place]  [address]       Ph: [phone] or [phone]       Fax: [phone]   RxID:   [id number]  ]  Appended Document: (ACUTE-YOUNG)PER POKHAREL 1 WEEK F/U/CH Pathology reveals NHL - referral to Heme/Onc made.  Pt, mother and sister are aware.

## 2010-05-26 NOTE — Consult Note (Signed)
Summary: [facility]  [facility]   Imported By: Florinda Marker 07/08/2009 15:44:45  _____________________________________________________________________  External Attachment:    Type:   Image     Comment:   External Document

## 2010-05-28 NOTE — Assessment & Plan Note (Signed)
Summary: cough/fever/gg   Vital Signs:  Patient profile:   50 year old male Height:      66.5 inches (168.91 cm) Weight:      211.9 pounds (96.32 kg) BMI:     33.81 Temp:     97.6 degrees F (36.44 degrees C) oral Pulse rate:   96 / minute BP sitting:   150 / 88  (right arm) Cuff size:   large  Vitals Entered By: Cynda Familia Duncan Dull) (May 06, 2010 9:32 AM) CC: pt c/o coughing x 3days, sore throat,  right side neck pain-hurts to swallow Is Patient Diabetic? No Pain Assessment Patient in pain? yes     Location: throat Intensity: 9 Type: sharp Onset of pain  hurts when he cought or swallows  Have you ever been in a relationship where you felt threatened, hurt or afraid?Unable to ask   Does patient need assistance? Functional Status Self care Ambulation Normal   CC:  pt c/o coughing x 3days, sore throat, and right side neck pain-hurts to swallow.  History of Present Illness: 49y/o m with HTN, Hep C, schizophrenia, treted NHL comes to the cliinc c/o cough, sore throat, odynaphagia, mild fever, hoarseness, runny nose fatigue  going on since 5 days started with dry cough no sick contacts, SOB, chest pain non smoker, non drinker    Preventive Screening-Counseling & Management  Alcohol-Tobacco     Alcohol drinks/day: 0     Smoking Status: quit     Year Quit: 25 - 30 years  Current Medications (verified): 1)  Zyprexa 20 Mg Tabs (Olanzapine) .... Take 1 Tablet By Mouth Once A Day 2)  Lamotrigine 25 Mg Tabs (Lamotrigine) .... Take 1 Tablet By Mouth Once A Day 3)  Haloperidol 2 Mg Tabs (Haloperidol) .... Take 1 Tablet By Mouth Once A Day  Allergies (verified): No Known Drug Allergies  Review of Systems       The patient complains of fever, hoarseness, and prolonged cough.  The patient denies anorexia, weight loss, weight gain, vision loss, decreased hearing, chest pain, syncope, dyspnea on exertion, peripheral edema, headaches, hemoptysis, abdominal pain,  melena, hematochezia, severe indigestion/heartburn, hematuria, incontinence, genital sores, muscle weakness, suspicious skin lesions, transient blindness, difficulty walking, depression, unusual weight change, abnormal bleeding, enlarged lymph nodes, angioedema, breast masses, and testicular masses.    Physical Exam  General:  Gen: VS reveiwed, Alert, well developed, nodistress ENT: mucous membranes pink & moist. No abnormal finds in ear and nose.congestion in post pharyngeal wal and tonsillar adenopathy CVC:S1 S2 , no murmurs, no abnormal heart sounds. Lungs: Clear to auscultation B/L. No wheezes, mild crackles on Rt lowe field  Abdomen: soft, non distended, no tender. Normal Bowel sounds EXT: no pitting edema, no engorged veins, Pulsations normal  Neuro:alert, oriented *3, cranial nerved 2-12 intact, strenght normal in all  extremities, senstations normal to light touch.      Impression & Recommendations:  Problem # 1:  COUGH (ICD-786.2) cough with flu like symtpoms congestion in post pharyngeal wall with mild tonsllar nelargement no SOB, chest pain, mild crackels on pulnmonary exam  plan - z-pack given symtoms for more than 5 days - CBC given h/o NHL - CXR for abnormal lung exam and h/o intubation with pneumonia 3 yrs ago - return to clinc if symtoms not better in 1 week.  Orders: T-CBC w/Diff [id number]) T-Basic Metabolic Panel [id number]) CXR- 2view (CXR) T-Culture, Rapid Strep ([id number])  Complete Medication List: 1)  Zyprexa 20 Mg Tabs (Olanzapine) .... Take  1 tablet by mouth once a day 2)  Lamotrigine 25 Mg Tabs (Lamotrigine) .... Take 1 tablet by mouth once a day 3)  Haloperidol 2 Mg Tabs (Haloperidol) .... Take 1 tablet by mouth once a day 4)  Azithromycin 250 Mg Tabs (Azithromycin) .... Take 2 tabs today, 1 tablet a day for next 3 days  Patient Instructions: 1)  Come to the clinc if you are not feeling better in 1 week. The cough might take some time to  resove, but call us if you are having fever, chest pain or shortness of breath 2)  Get plenty of rest, drink lots of clear liquids, and use Tylenol or Ibuprofen for fever and comfort. Return in 7-10 days if you're not better:sooner if you're feeling worse. Prescriptions: AZITHROMYCIN 250 MG TABS (AZITHROMYCIN) take 2 tabs today, 1 tablet a day for next 3 days  #5 x 0   Entered and Authorized by:   Bethel Born MD   Signed by:   Bethel Born MD on 05/06/2010   Method used:   Electronically to        [facility]* (retail)       [address]       [place], [place]  [id number]       Ph: [phone]       Fax: [phone]   RxID:   [id number]    Orders Added: 1)  T-CBC w/Diff [[id number]] 2)  T-Basic Metabolic Panel [phone] 3)  CXR- 2view [CXR] 4)  T-Culture, Rapid Strep [[id number]] 5)  Est. Patient Level IV [18841]   Process Orders Check Orders Results:     [facility]: Check successful Tests Sent for requisitioning (May 06, 2010 3:49 PM):     05/06/2010: [facility] -- T-CBC w/Diff [[id number]] (signed)     05/06/2010: [facility] -- T-Basic Metabolic Panel [id number] (signed)     05/06/2010: [facility] -- T-Culture, Rapid Strep [[id number]] (signed)

## 2010-06-22 ENCOUNTER — Other Ambulatory Visit: Payer: Self-pay | Admitting: Internal Medicine

## 2010-06-22 ENCOUNTER — Ambulatory Visit (HOSPITAL_COMMUNITY)
Admission: RE | Admit: 2010-06-22 | Discharge: 2010-06-22 | Disposition: A | Discharge status: Home / Self Care | Payer: Medicare Other | Source: Ambulatory Visit | Attending: Internal Medicine | Admitting: Internal Medicine

## 2010-06-22 ENCOUNTER — Encounter (HOSPITAL_BASED_OUTPATIENT_CLINIC_OR_DEPARTMENT_OTHER): Payer: Medicare Other | Admitting: Internal Medicine

## 2010-06-22 DIAGNOSIS — C8589 Other specified types of non-Hodgkin lymphoma, extranodal and solid organ sites: Secondary | ICD-10-CM | POA: Insufficient documentation

## 2010-06-22 DIAGNOSIS — Z8572 Personal history of non-Hodgkin lymphomas: Secondary | ICD-10-CM

## 2010-06-22 DIAGNOSIS — R161 Splenomegaly, not elsewhere classified: Secondary | ICD-10-CM | POA: Insufficient documentation

## 2010-06-22 LAB — CBC WITH DIFFERENTIAL/PLATELET
BASO%: 0.4 % (ref 0.0–2.0)
Basophils Absolute: 0 10*3/uL (ref 0.0–0.1)
EOS%: 1.7 % (ref 0.0–7.0)
Eosinophils Absolute: 0.1 10*3/uL (ref 0.0–0.5)
HCT: 43.5 % (ref 38.4–49.9)
HGB: 14.5 g/dL (ref 13.0–17.1)
LYMPH%: 34.6 % (ref 14.0–49.0)
MCH: 29.2 pg (ref 27.2–33.4)
MCHC: 33.4 g/dL (ref 32.0–36.0)
MCV: 87.3 fL (ref 79.3–98.0)
MONO#: 0.5 10*3/uL (ref 0.1–0.9)
MONO%: 15.5 % — ABNORMAL HIGH (ref 0.0–14.0)
NEUT#: 1.4 10*3/uL — ABNORMAL LOW (ref 1.5–6.5)
NEUT%: 47.8 % (ref 39.0–75.0)
Platelets: 96 10*3/uL — ABNORMAL LOW (ref 140–400)
RBC: 4.98 10*6/uL (ref 4.20–5.82)
RDW: 13.1 % (ref 11.0–14.6)
WBC: 3 10*3/uL — ABNORMAL LOW (ref 4.0–10.3)
lymph#: 1 10*3/uL (ref 0.9–3.3)

## 2010-06-22 LAB — CMP (CANCER CENTER ONLY)
ALT(SGPT): 73 U/L — ABNORMAL HIGH (ref 10–47)
AST: 112 U/L — ABNORMAL HIGH (ref 11–38)
Albumin: 3.6 g/dL (ref 3.3–5.5)
Alkaline Phosphatase: 107 U/L — ABNORMAL HIGH (ref 26–84)
BUN, Bld: 8 mg/dL (ref 7–22)
CO2: 29 mEq/L (ref 18–33)
Calcium: 9.2 mg/dL (ref 8.0–10.3)
Chloride: 102 mEq/L (ref 98–108)
Creat: 0.9 mg/dl (ref 0.6–1.2)
Glucose, Bld: 109 mg/dL (ref 73–118)
Potassium: 4.4 mEq/L (ref 3.3–4.7)
Sodium: 139 mEq/L (ref 128–145)
Total Bilirubin: 1.7 mg/dl — ABNORMAL HIGH (ref 0.20–1.60)
Total Protein: 7.8 g/dL (ref 6.4–8.1)

## 2010-06-22 LAB — LACTATE DEHYDROGENASE: LDH: 123 U/L (ref 94–250)

## 2010-06-22 MED ORDER — IOHEXOL 300 MG/ML  SOLN: 100.0000 mL | Freq: Once | INTRAMUSCULAR | Status: AC | PRN

## 2010-06-24 ENCOUNTER — Other Ambulatory Visit: Payer: Self-pay | Admitting: Internal Medicine

## 2010-06-24 ENCOUNTER — Encounter (HOSPITAL_BASED_OUTPATIENT_CLINIC_OR_DEPARTMENT_OTHER): Payer: Medicare Other | Admitting: Internal Medicine

## 2010-06-24 DIAGNOSIS — C8590 Non-Hodgkin lymphoma, unspecified, unspecified site: Secondary | ICD-10-CM

## 2010-06-24 DIAGNOSIS — C8589 Other specified types of non-Hodgkin lymphoma, extranodal and solid organ sites: Secondary | ICD-10-CM

## 2010-08-01 LAB — GLUCOSE, CAPILLARY: Glucose-Capillary: 92 mg/dL (ref 70–99)

## 2010-08-05 LAB — GLUCOSE, CAPILLARY: Glucose-Capillary: 105 mg/dL — ABNORMAL HIGH (ref 70–99)

## 2010-08-11 ENCOUNTER — Ambulatory Visit (INDEPENDENT_AMBULATORY_CARE_PROVIDER_SITE_OTHER): Payer: Medicare Other | Admitting: Ophthalmology

## 2010-08-11 ENCOUNTER — Encounter: Payer: Self-pay | Admitting: Ophthalmology

## 2010-08-11 DIAGNOSIS — F209 Schizophrenia, unspecified: Secondary | ICD-10-CM

## 2010-08-11 DIAGNOSIS — B1710 Acute hepatitis C without hepatic coma: Secondary | ICD-10-CM

## 2010-08-11 DIAGNOSIS — I10 Essential (primary) hypertension: Secondary | ICD-10-CM

## 2010-08-11 DIAGNOSIS — L989 Disorder of the skin and subcutaneous tissue, unspecified: Secondary | ICD-10-CM

## 2010-08-11 DIAGNOSIS — N529 Male erectile dysfunction, unspecified: Secondary | ICD-10-CM

## 2010-08-11 MED ORDER — SILDENAFIL CITRATE 25 MG PO TABS: 25.0000 mg | ORAL_TABLET | Freq: Every day | ORAL | Status: DC | PRN

## 2010-08-11 NOTE — Patient Instructions (Signed)
We will have home health care, and evaluate your needs. Please return in 3 months for regular checkup.

## 2010-08-11 NOTE — Assessment & Plan Note (Addendum)
The patient is currently not on any blood pressure medications, and blood pressure today was 122/77. We'll continue to monitor.

## 2010-08-11 NOTE — Progress Notes (Signed)
  Subjective:    Patient ID: [name], male    DOB: 05/28/1960, 50 y.o.   MRN: [id number]  HPI  This is a 50 year old male with a past medical history significant for non-Hodgkin's lymphoma, schizophrenia, and hepatitis C, who presents so that he can have home health papers filled out. The patient states that often times, in the mornings, he has difficulty with bathing and dressing because of his schizophrenia. The patient is requesting home health services because of this, and a consult to social work to assist in the process. With regards to the patient's schizophrenia, he continues to hear voices daily, and the intensity waxes and wanes. The patient says that the voices tell him to do things but he is able to avoid doing those things if they are inappropriate such as "hitting people". The patient's sister denies any recent behavioral problems. The patient is now status post CHOP therapy for his non-Hodgkin's lymphoma, is in regression, and continues to be followed at the cancer center. The patient also has hepatitis C, and his last complete metabolic panel was performed in August of last year. The patient is scheduled to followup with Dr. Madilyn Fireman who helps to manage his hepatitis C, this Thursday. The patient's only other concern today, is erectile dysfunction over the last 1-2 years. The patient is requesting a prescription of Viagra.  Review of Systems  Constitutional: Negative for fever and chills.  Respiratory: Negative for cough and shortness of breath.   Cardiovascular: Negative for chest pain and palpitations.  Gastrointestinal: Negative for vomiting, diarrhea and constipation.       Objective:   Physical Exam  Constitutional: He appears well-developed and well-nourished.  HENT:  Head: Normocephalic and atraumatic.  Eyes: Pupils are equal, round, and reactive to light.  Cardiovascular: Normal rate, regular rhythm and intact distal pulses.  Exam reveals no gallop and no friction rub.     No murmur heard. Pulmonary/Chest: Effort normal and breath sounds normal. He has no wheezes. He has no rales.  Abdominal: Soft. Bowel sounds are normal. He exhibits distension. There is no tenderness.       There is mild abdominal distention.  Musculoskeletal: Normal range of motion. He exhibits edema.       Trace edema  Neurological: He is alert. No cranial nerve deficit.  Skin: No rash noted.    Patient Active Problem List  Diagnoses  . HEPATITIS C  . NON-HODGKIN'S LYMPHOMA  . PANCYTOPENIA  . SCHIZOPHRENIA  . HYPERTENSION  . INTERNAL HEMORRHOIDS  . HEMORRHOIDS, EXTERNAL  . SBO  . RECTAL BLEEDING  . URINARY TRACT INFECTION  . DISSEMINATED SUPERFICIAL ACTINIC POROKERATOSIS  . ECZEMA  . SKIN LESIONS, MULTIPLE  . LEG EDEMA, BILATERAL  . TACHYCARDIA  . LYMPHADENOPATHY, DIFFUSE  . DARK URINE  . ABDOMINAL PAIN, GENERALIZED  . TRANSAMINASES, SERUM, ELEVATED  . ABDOMINAL ULTRASOUND, ABNORMAL     Assessment & Plan:

## 2010-08-11 NOTE — Assessment & Plan Note (Signed)
This appears to be under reasonable control, and appears stable at this time. The patient is currently taking his medications regularly, and continues to follow at the [facility]. Because of his schizophrenia, the patient states that often times in the morning he has a difficult time with activities of daily living such as bathing and dressing. I do think it is reasonable for the patient to receive home health care, and would like them to evaluate and treat as they deem necessary. I consulted social work for assistance in this process.

## 2010-08-11 NOTE — Assessment & Plan Note (Signed)
The patient has multiple nodular skin lesions, for which she has been previously evaluated by dermatology, and they were determined to be benign. The patient wishes for further management and possible removal or freezing at this time and requests referral back to dermatology.

## 2010-08-11 NOTE — Assessment & Plan Note (Signed)
This is followed by Dr. Madilyn Fireman, and the patient is scheduled to followup for his hepatitis C this Thursday. The patient will need a repeat CMET, which I will defer to Dr. Madilyn Fireman. The patient's last metabolic panel was improved from prior, and the patient is not requiring any medications at this time.

## 2010-08-11 NOTE — Assessment & Plan Note (Signed)
There don't appear to be any contraindications to the use of Viagra at this time and I'll start the patient on this medication today

## 2010-08-14 ENCOUNTER — Other Ambulatory Visit: Payer: Self-pay | Admitting: Gastroenterology

## 2010-08-14 DIAGNOSIS — B1920 Unspecified viral hepatitis C without hepatic coma: Secondary | ICD-10-CM

## 2010-08-14 DIAGNOSIS — R7989 Other specified abnormal findings of blood chemistry: Secondary | ICD-10-CM

## 2010-08-17 ENCOUNTER — Inpatient Hospital Stay: Admission: RE | Admit: 2010-08-17 | Payer: Medicare Other | Source: Ambulatory Visit

## 2010-08-18 ENCOUNTER — Other Ambulatory Visit: Payer: Medicare Other

## 2010-08-18 ENCOUNTER — Ambulatory Visit
Admission: RE | Admit: 2010-08-18 | Discharge: 2010-08-18 | Disposition: A | Discharge status: Home / Self Care | Payer: Medicare Other | Source: Ambulatory Visit | Attending: Gastroenterology | Admitting: Gastroenterology

## 2010-08-18 DIAGNOSIS — B1920 Unspecified viral hepatitis C without hepatic coma: Secondary | ICD-10-CM

## 2010-08-18 DIAGNOSIS — R7989 Other specified abnormal findings of blood chemistry: Secondary | ICD-10-CM

## 2010-09-07 ENCOUNTER — Other Ambulatory Visit: Payer: Self-pay | Admitting: Surgery

## 2010-09-08 NOTE — Discharge Summary (Signed)
NAME:  [name], [name]                 ACCOUNT NO.:  [id number]   MEDICAL RECORD NO.:  [id number]          PATIENT TYPE:  INP   LOCATION:  1312                         FACILITY:  [facility]   PHYSICIAN:  Lajuana Matte, MD  DATE OF BIRTH:  1961-02-16   DATE OF ADMISSION:  07/10/2007  DATE OF DISCHARGE:  08/29/2007                               DISCHARGE SUMMARY   DISCHARGE DIAGNOSES:  1. Stage IV non-Hodgkin's lymphoma, B-cell, status post four weekly      doses of Rituxan followed by one cycle of CHOP-R.  2. Pancytopenia, improved.  3. Poor nutrition status requiring PEG tube feeding.  4. Schizophrenia, on Prolixin, Cogentin and Klonopin.  5. Hepatitis C positive.  6. Hypertension.  7. Acute mental status changes on admission, improved.  8. Ascites.  9. Respiratory alkalosis, on July 13, 2007, improved after      intervention by critical care medicine.  10.Respiratory distress requiring intubation on July 13, 2007,      improved after intervention by Critical Care Medicine.  11.Hypertension.  12.Aspiration - dysphagia, status PEG tube.  13.Thrush.  14.Recurrent bronchitis, requiring Rocephin.  15.Small bowel obstruction.  16.Enterobacter UTI.  17.Pleural effusion.  18.Insulin dependent diabetes mellitus during the hospitalization   CONSULTATIONS:  1. Dr. Dietrich Pates, [facility].  2. Critical Care Medicine.  3. Dr. Geralyn Flash.   PROCEDURES:  1. Status post chemotherapy on July 28, 2007, consistent of Rituxan at      375 mg per meter square equals 700 mg IV infusion, along with      prednisone 100 mg p.o. daily for 5 days starting on July 28, 2007.  2. Status post chemotherapy consisting of Rituxan 375 mg/m2 at 700 mg      IV infusion on August 16, 2007, Cytoxan 375 mg per meter square at      700 mg IV on August 16, 2007, doxorubicin 25 mg per meter squared      equals 48 mg IV on August 16, 2007, and Vincristine at 1.4 mg per      meter squared equals 2 mg maximum  dose IV on day #1 along with      prednisone 100 mg p.o. daily on days 1-5 which is August 16, 2007      through April 26, 200.  3. Status post PEG tube August 23, 2007.  4. Status post swallowing function study on August 21, 2007 and on      August 16, 2007.  5. Status post ultrasound-guided right thoracentesis on August 09, 2007.  6. Status post CT of the abdomen on July 25, 2007.  7. Status post CT of the pelvis on July 25, 2007.  8. Status post paracentesis on July 21, 2007.  9. Status post MUGA study on July 12, 2007.  10.Status post MRI of the brain July 11, 2007.  11.Status post CT of the head July 10, 2007.  12.Status post percutaneous tracheostomy on July 28, 2007.  13.Status post chemotherapy with Cytoxan and prednisone on July 21, 2007.  14.Status post chemotherapy on July 28, 2007 with Rituxan and      prednisone.  15.Status post chemotherapy with Rituxan on August 07, 2007.   LABORATORY DATA:  On discharge, white count 11.7, hemoglobin 10.8,  hematocrit 32.6, MCV 77.8, platelet count 201, neutrophils 7.2,  monocytes 2.2, lymphocytes 2.20.  Uric acid 4.1, LDH 181, sodium 137,  potassium 3.8, BUN 7, creatinine 0.52, glucose 109, calcium 9.5, total  bilirubin 1.3, alkaline phosphatase 91, AST 37, ALT 45, __________  5.9,  albumin 2.8.  LDH in fluid at 412.  Sed rate on April 28, T4 1.47, TSH  4.345.  Ammonia level on admission was 38.   HISTORY OF PRESENT ILLNESS:  [name] is a 50 year old African  American male with multiple medical problems listed below admitted on  July 10, 2007, to [facility] with acute mental status changes,  poor p.o. intake, abdominal pain and gait imbalance, suspected to have  doubled his dose of antipsychotic medications for his history of  schizophrenia.  He had a CT scan of the head performed in the emergency  room, unremarkable.  Of note, the patient has had a CT scan as an  outpatient of the chest, abdomen and pelvis  on June 30, 2007, for  evaluation of questionable enlarged lymph nodes, the scan showing  diffuse adenopathy throughout chest, questionable for lymphoma versus  metastatic disease.  In addition, at the time, there were also small  bilateral pleural effusions and diffuse abdominal and retroperitoneal  lymphadenopathy, with innumerable hepatic and splenic lesions.  On March  6, the patient underwent ultrasound guided core biopsies of the left  neck, supraclavicular lymph node, which according to the pathology  report case number [id number] revealed atypical lymphoid infiltrate  consistent with non-Hodgkin's lymphoma, B cell type.  He did not have a  referral to the [facility], but when he was admitted on  March 16, Dr. Wyonia Hough  kindly asked Dr. Arbutus Ped to see him for evaluation  of his recent diagnoses.  When seen, the patient was lethargic, for  which his sister, who was at the bedside, helped with the history taken.  He continued to have abdominal swelling, poor appetite, abdominal pain  and of course confusion, along with mild shortness of breath and  generalized weakness.  Dr. Arbutus Ped at the time, had a long discussion  with the patient and his sister regarding the diseased stage prognosis  and treatment option.  Dr. Arbutus Ped recommended CHOP and Rituxan which  was to be given for a few days, but prior to that, improvement in the  patient's  mental status before chemotherapy was recommended.  His  status was complicated with several issues, which did not allow  chemotherapy to begin until July 21, 2007, with Rituxan and prednisone,  then on July 28, 2007, with Rituxan and prednisone, on April 13 with  Rituxan only, and on April 22, Rituxan and CHOP.  During this period of  time, his status was complicated with a pancytopenia, anemia requiring  transfusion, along with thrush, which needed to be cared for with  Diflucan.  It is important to mention that prior to the  administration  of chemotherapy, his status was complicated with sepsis, with acute  respiratory failure, which required a tracheostomy.  This was followed  by Critical Care Medicine.  In addition, he had severe oropharyngeal  dysphagia, with overt gross aspiration, requiring modified barium  swallow study on August 16, 2007, which for which diet recommendations  where at the time to pursue n.p.o., a placement of a PEG tube for better  nutritional status.  His course of hospitalization as well was  complicated both by Enterobacter UTI, requiring IV antibiotics.  By the  end of April, the patient began to feel better.  At this time, he had  already begun the four cycles of chemotherapy.  He overall tolerated the  procedures well, but his counts had to be monitored closely, as well as  his potential tumor lysis had to be monitored as well.  He continue with  GI and DVT prophylaxis.  His thrush eventually improved with Diflucan,  but did not resolve.  Over the following days, his clinical picture  began to be improve, with the exception of severe dysphagia, for which  aspiration needed to be monitored.  However, the rest of his clinical  picture is better.  As of Aug 29, 2007, his vital signs are stable, he is  afebrile, she is doing well, with no complaints, and ambulating.  He has  no fever or chills.  No chest pain or shortness of breath.  He is awake,  alert and in no acute distress,  his exam is essentially unremarkable.  By now, he status post four weekly doses of Rituxan followed by one  cycles of CHOP-R.  His pancytopenia is improved, his Neupogen will be  discontinued today.  He will continue with PEG tube feedings, and follow  that by home health nursing.  Home health nursing knows to call to the  [facility] telephone number is [phone], if any problems are  to arise.  He is schizophrenia.  Will continue to be treated with  Prolixin, Cogentin And Klonopin, and he will also  continue with GI and  DVT prophylaxis.   Of the record go back to after discharge diagnosis and below that at.   MEDICATIONS ON DISCHARGE.:  1. Lacrilube SOP ophthalmic on admission, one application every hours      in the eye.  2  Cogentin 0.5 mg tablet b.i.d.  1. Prolixin 1 mg p.o. q.h.s.  2. Klonopin 0.5 mg p.o. q.h.s.  5  Protonix 40 mg oral syringe daily.  1. Albuterol inhaled solution q.8 h, and q.3 h. p.r.n.  2. Neosporin ointment applied to the PEG site daily.  3. Jevity  1.2 calories nutritional supplement at a rate of 90 mL an      hour.  4. Diflucan 100 mg dose q.24 h.  5. Dulcolax 10 mg suppository p.r.n.  6. Vicodin 50 mL q.6 h p.r.n. for pain  7. Water for irrigation 50 mL per tube flashing   ASSESSMENT:  Overall, his condition is improved, and he is to follow up  with Dr. Arbutus Ped.  The office is to call.      Marlowe Kays, P.A.      Lajuana Matte, MD  Electronically Signed    SW/MEDQ  D:  08/29/2007  T:  08/29/2007  Job:  [id number]

## 2010-09-08 NOTE — Consult Note (Signed)
NAMEGerrald, [name]                 ACCOUNT NO.:  [id number]   MEDICAL RECORD NO.:  [id number]          PATIENT TYPE:  INP   LOCATION:  5530                         FACILITY:  [facility]   PHYSICIAN:  Lajuana Matte, MD  DATE OF BIRTH:  07-Sep-1960   DATE OF CONSULTATION:  07/10/2007  DATE OF DISCHARGE:                                 CONSULTATION   REFERRING PHYSICIAN:  Dr. Madaline Guthrie, M.D.   REASON FOR CONSULTATION:  A 50 year old African-American male recently  diagnosed with non-Hodgkin lymphoma.   HISTORY:  [name] is a very pleasant 50 year old African-American  male with a past medical history significant for schizophrenia,  hepatitis C, and hypertension, who was admitted earlier today by the  teaching service at [facility] with mental status change, poor  p.o. intake, as well as abdominal pain and gait imbalance.  The patient  was suspected to have doubled his dose of his anti-psychotic can  medications including Clozaril, Benzatropine and fluphenazine last  night.  He had a CT scan of the head performed in the emergency room  that was unremarkable for any abnormalities.  Of note, that the patient  had a CT scan of the chest, abdomen, pelvis performed on June 30, 2007  for evaluation of questionable enlarged lymph nodes, and the scan showed  diffuse adenopathy throughout the chest, questionable for lymphoma  versus metastatic disease.  There were also small bilateral pleural  effusion and diffuse abdominal and retroperitoneal lymphadenopathy, as  well as innumerable hepatic and splenic lesions.  On June 30, 2007, the  patient underwent ultrasound-guided core biopsies of the left neck  supraclavicular lymph node, which according to the pathology report case  number [id number], revealed atypical lymphoid infiltrate consistent with  non-Hodgkin's lymphoma, B-cell type.  The patient did not have referral  to the [facility], but when he was admitted today Dr.  Wyonia Hough  kindly asked me to see him for evaluation of his recently diagnosed non-  Hodgkin's lymphoma.  When seen today, the patient was sleepy.  His  sister was at the bedside and helped with the history taken.  The  patient continues to complain of abdominal swelling, as well as poor  appetite, abdominal pain and of course the confusion that started  earlier today.  He also has mild shortness of breath and generalized  weakness.   REVIEW OF SYSTEMS:  On review of systems, he has no fever, no chills, no  headache, no blurry vision or double vision.  He has no chest pain but  continued to have the shortness of breath increased with exertion.  No  cough, no hemoptysis, syncope or palpitation.  No nausea/vomiting.  Has  abdominal pain with no melena or hematochezia.  No dysuria or hematuria.   PAST MEDICAL HISTORY:  As mentioned above, significant for:  1. Hypertension.  2. Schizophrenia.  3. Hepatitis C.  4. History of recent diagnosis of non-Hodgkin's lymphoma.   FAMILY HISTORY:  Unremarkable for any malignancy.  Mother had diabetes  mellitus.   SOCIAL HISTORY:  He is single, has no children.  He has a history of  smoking but not recently.  No history of alcohol or drug abuse recently.   ALLERGIES:  NO KNOWN DRUG.   CURRENT HOME MEDICATIONS:  Include Clozaril, Atropine, fluphenazine,  Protonix, Senokot, MSIR, Megace and Nystatin.   PHYSICAL EXAM:  VITAL SIGNS:  Blood pressure 143/95, pulse 133,  respiratory rate 28, temperature 96.2, oxygen saturation 96% on room  air.  GENERAL:  Exam showed very pleasant 50 year old African-American male,  was sleepy today but responsive to verbal stimuli and answered a few  questions.  HEENT:  Normocephalic, atraumatic.  Clear oropharynx.  NECK:  Exam showed palpable left supraclavicular lymph node measuring  around 3-4 cm in size, nontender to palpation.  CHEST:  Chest exam clear to auscultation.  No wheezes or crackles.  CARDIOVASCULAR:   Tachycardiac with normal S1-S2, no murmur or gallops.  ABDOMEN:  Exam soft, nontender, moderately distended.  EXTREMITIES:  Shows trace edema.   LABORATORY DATA:  CBC and BMET today showed white blood count at 12.5,  hemoglobin 11.2, hematocrit 35.0, platelets 377, sodium 132, potassium  3.8, glucose 102, BUN 10, creatinine 1.15, calcium 9.9.   ASSESSMENT/PLAN:  This is a pleasant 50 year old African-American male  who was recently diagnosed with non-Hodgkin lymphoma, B-cell type;  questionable for stage IV.  I had a long discussion with the patient and  his sister today about his disease stage prognosis and treatment  options.  I recommend for them treatment with chemotherapy in the form  of CHOP/Retuxin, which will be given in the next few days.  I would like  to see some improvement in this patient's mental status before starting  chemotherapy, so he can understand his disease and treatment options  before starting the treatment.  Once the patient is feeling a little bit  better in the next day or two, I will transfer him to [facility] oncology floor to start his first cycle of chemotherapy on  inpatient basis, and I will also consider the patient for a bone marrow  biopsy and aspirate at that time.  I would continue IV hydration with  D5W and sodium bicarb.  I also will start the patient prophylactically  on allopurinol for renal protection.  Thank you so much for allowing me  to participate in the care of [name], and I will continue to follow  up the patient with you and assist in his management.  Thank you.      Lajuana Matte, MD  Electronically Signed     MKM/MEDQ  D:  07/10/2007  T:  07/10/2007  Job:  [id number]

## 2010-09-08 NOTE — Discharge Summary (Signed)
NAMEMarkevius, [name]                 ACCOUNT NO.:  [id number]   MEDICAL RECORD NO.:  [id number]            PATIENT TYPE:   LOCATION:                                 FACILITY:   PHYSICIAN:  Madaline Guthrie, M.D.    DATE OF BIRTH:  12-Feb-1961   DATE OF ADMISSION:  DATE OF DISCHARGE:  07/12/2007                               DISCHARGE SUMMARY   DISCHARGE DIAGNOSES:  1. Altered mental status.  2. Left lower lobe pneumonia.  3. Schizophrenia.  4. Non-Hodgkin's  lymphoma.  5. Hepatitis C.  6. Hypertension.   MEDICATIONS AT TIME OF DISCHARGE:  1. Allopurinol 100 mg p.o. b.i.d.  2. Benzatropine 0.5 mg p.o. b.i.d.  3. Clozaril 300 mg p.o. q.h.s.  4. Prolixin, that is Fluphenazine 1 mg p.o. nightly.  5. Ensure Plus 1 can p.o. t.i.d. w.c.  6. Magic Mouth Wash 5 mL p.o. q.a.c. and h.s.  7. Megace 800 mg p.o. daily.  8. Avelox 400 mg p.o. daily for another 4 days, that means through      March 20th so discontinue after dose of July 16, 2007.  9. Protonix 40 mg p.o. daily.  10.Senokot S 1 tablet p.o. nightly.   DISPOSITION AND FOLLOWUP:  The patient is to be transferred to [facility] per Dr. Arbutus Ped to the Oncology floor to begin chemotherapy for non-  Hodgkin's lymphoma. The patient is to complete a total of 7 days of  antibiotics for left lower lobe pneumonia started on the 16th, meaning  to complete 7 days of antibiotics going through July 16, 2007.   PROCEDURES PERFORMED:  None.   CONSULTATIONS:  Dr. Si Gaul from Oncology was consulted during  the admission and helped with the patient's care.   BRIEF ADMITTING HISTORY AND PHYSICAL:  [name] is a 50 year old with  history of hepatitis C, hypertension, and schizophrenia along with newly  diagnosed non-Hodgkin's lymphoma with tissue biopsy read July 06, 2007.  He now presents to the emergency department with his sister for altered  mental status.  Patient is unable to provide history.  Per the sister,  the patient has  not been at baseline for the last few weeks with  decreased eating, abdominal pain, and off balance.  They state he has  become or was noticed to be disoriented the day of admission, described  unable to answer questions appropriately.  Patient states that he took  an extra dose of Clozaril, Benzatropine, and fluphenazine the night  prior top admission.  The patient has also had increased abdominal pain  with apparently no relieve with his MSRI 15 mg q.4.h. p.r.n.  The  patient and sister deny any fevers, chills, chest pain.  Sister denied  any access to alcohol or drugs, trauma, focal weakness, or seizure.   PHYSICAL EXAM:  Temperature 96.2, blood pressure 143/95, pulse 133,  respirations 28, 02 saturation 96% on room air.  Of note, the patient's  weight in July of 2008 was 201, November 2008, 188, and January 2009,  178 pounds.  GENERAL:  The patient was in no acute  distress with a distended abdomen  and breathing through his mouth.  EYES:  Sclerae were icteric, pupils small, nonreactive.  Extraocular  movements intact.  ENT:  With dry lips and mucous membranes.  Tongue was dry with cracks.  NECK:  Supple, no thyromegaly, some submandibular adenopathy and  prominent left supraclavicular node measuring about 2 x 5-cm or so,  indurated and fixed at the base.  RESPIRATIONS:  With good air movement and bibasilar crackles.  CARDIOVASCULAR:  The patient had a positive S1, S2, regular, but  tachycardic without any murmurs, gallops, or rubs.  GASTROINTESTINAL:  Decreased bowel sounds noted, distended abdomen,  diffusely mild tenderness to palpation.  No rebound, no guarding.  Liver  was palpated approximately 5-cm below the costal margin.  EXTREMITIES:  With +2 pitting edema, decreasing proximally but still  having pitting edema in the posterior thighs.  Edema was symmetrical.  GENITOURINARY:  No CVA tenderness.  Foley was in place.  SKIN:  No rashes.  LYMPHATICS:  As mentioned above, some  submandibular adenopathy along  with a prominent supraclavicular left sided lymph node.  NEURO:  Patient was awake and alert, oriented to person and place only.  Cranial nerves II-XII grossly intact.  Strength intact, sensory intact.  PSYCH:  Patient demonstrated a flat affect and incongruent speech.   LABS ON ADMISSION:  Sodium 132, potassium 3.8, chloride 98, bicarb 17,  BUN 10, creatinine 1.15, glucose 102, anion gap 17, bilirubin 2.4,  direct 1.4, indirect 1.0, alkaline phosphatase 189, AST 92, ALT 31,  protein 7.3, albumin 2.8, calcium 9.9.  White count 12.9, hemoglobin  11.2, platelets 377, lipase 21, ammonia 38, Alpha fetoprotein on March  4, 10.2.   HOSPITAL COURSE:  1. Altered mental status:  It was likely multifactorial, left lower      lobe  pneumonia being a likely culprit.  The patient was started on      Avelox with a decrease in his white count.  No fevers demonstrated      throughout the hospitalization and patient did not complain of any      shortness of breath.  The patient had 2 blood cultures done with no      growth to date until the date of discharge.  The patient also had      mild hypercalcemia on admission that corrected for hypoalbuminemia,      was approximately 11.  This was unlikely to be the cause of the      altered mental status, but regardless patient was hydrated with      normalization of his calcium by the following morning.  The      patient's mental status was slightly improved the following day.      There was also a concern for metastasis of lymphoma to the brain.      The patient had a noncontrast CT scan on admission that was      negative ruling out any source of hemorrhagic stroke or other bleed      such as subdural, epidural, subarachnoid, etc.  Also, no trauma or      concussion noted.  Given the concern for metastatic lymphoma, MRI      of the brain was obtained that was negative for metastasis.  Also      of concern is the patient's  lymphoma itself causing the altered      mental status.  The patient was noted to have a lactic acidosis  with a lactic acid of 4.1, bicarb of 17, and an anion gap of 17      though pH  on an ABG was 7.4 with a pC02 of 27 and a bicarb of 18      demonstrating a respiratory alkalosis.  It is unsure if the      respiratory alkalosis is secondary to tachypnea secondary to the      left lower lobe pneumonia and distended abdomen secondary to      enlarged liver and possible ascites.  The patient's bicarb      increased to 18 throughout the hospitalization and anion gap      reduced to 11.  Other possibility considered was the extra dose of      antipsychotic medications along with opiates at home.  The      patient's antipsychotic medications were held on the day of      admission and restarted the next day.  Also, the patient's pain      medications were held since the patient was still altered in the      hospitalization without any complaints of pain.  The patient's      initial EKG was completely normal except for some sinus      tachycardia.   1. Non-Hodgkin's lymphoma:  Dr. Si Gaul from Oncology was      consulted and provided input with regards to the oncologic      treatment.  As mentioned above, the patient had an MRI of the brain      to rule out metastasis to the brain which was negative.  Also, a      MUGA scan was requested secondary to anticipated anthracycline      chemotherapy once transferred to [facility].  Of note, the MRI of      the brain was negative for metastasis.  MUGA scan showed ejection      fraction of 54%.  LDH was obtained on the day of discharge that was      elevated at 903 likely secondary to the lymphoma and uric acid on      the higher end of the normal range at 7.3.   1. Left lower lobe pneumonia:  The patient was started on Avelox on      the day of admission.  White count decreased during the      hospitalization and the patient was  afebrile throughout.  The      patient is to be transferred to [facility] to complete a total of      7 days of antibiotics.  Therefore, the patient is to continue on      Avelox 400 mg p.o. through March 22nd, that includes the dose of      March 22nd.   LABS ON DAY OF DISCHARGE:  WBC 11.1, hemoglobin 10.4, platelets 370,000.  Sodium 136, potassium 3.8, chloride 102, bicarb 18, glucose 87, BUN 7,  creatinine 0.94, calcium 9.1. Blood culture is no growth to date x2.  LDH 903, uric acid 7.3.      Mariea Stable, MD  Electronically Signed      Madaline Guthrie, M.D.  Electronically Signed    MA/MEDQ  D:  07/12/2007  T:  07/12/2007  Job:  [id number]   cc:   Lajuana Matte, MD

## 2010-09-08 NOTE — Op Note (Signed)
NAMETytus, [name]                 ACCOUNT NO.:  [id number]   MEDICAL RECORD NO.:  [id number]          PATIENT TYPE:  INP   LOCATION:  1223                         FACILITY:  [facility]   PHYSICIAN:  Nelda Bucks, MD DATE OF BIRTH:  18-Jun-1960   DATE OF PROCEDURE:  07/28/2007  DATE OF DISCHARGE:                               OPERATIVE REPORT   PROCEDURE:  Percutaneous tracheostomy.  Consent obtained from family,  who are well of risks and benefits of the procedure including death,  infection, pneumothorax, and bleeding.   INDICATION OR PREOPERATIVE DIAGNOSIS:  Ventilator-dependent respiratory  failure secondary to non-Hodgkin's lymphoma, inability to wean.   POSTOPERATIVE DIAGNOSES:  Status post tracheostomy for inability to wean  secondary to non-Hodgkin's lymphoma.   SURGEON:  Mcarthur Rossetti. Tyson Alias, MD.   The patient was laid in the supine position, Chlorhexidine preparation  was used over the neck over the second and third intratracheal space.  The bronchoscopist for the procedure was Dr. Levy Pupa with the  assistance of Brett Canales Minor, ACRN.  After a Chlorhexidine sterilization  and after the increase in the respiratory rate on the ventilator at 22  secondary to the use of paralytics, topical Lidocaine was administered,  approximately 6 ml of 2% Lidocaine.  A 1.2 cm vertical incision was made  and dissections were made down to the tracheal planes.  The ET tube was  backed up to approximately 18 cm under direct visualization, and an 18-  gauge needle over a wide catheter sheath was introduced into the airway  and directly visualized by Dr. Delton Coombes, there was no posterior wall injury  noted.  A wire was introduced through the white catheter sheath after  the needle was removed.  A 14-French appliance dilator was introduced  over the wire and percutaneously into the airway.  The appliance dilator  was removed.  A rhino dilator at 37-French was introduced over the wire  and over a  glider and into the airway and directly visualized without  any injury.  The dilator was removed and the glider remained. A 28-  French dilator over an 8 Shiley tracheostomy was introduced into the  airway over the wire and directly visualized.  The wire, glider, and 28-  [place] dilator were removed and the tracheostomy remained.  Dr. Delton Coombes  then went ahead and introduced the bronchoscope through the new  tracheostomy site and the carina was visualized at approximately 4 cm  from the tip of the tracheostomy tube.  There was no posterior wall  injury and no active bleeding below the tracheostomy.  Dr. Delton Coombes also  introduced a bronchoscope through the mouth into the ET-tube and  visualized the balloon from above and it showed some mild amount of  blood noted.  The blood was suctioned with normal saline and removed,  and after this occurred there was no bleeding whatsoever noted.  The  patient tolerated the procedure very, very well.  A portable chest x-ray  postprocedure revealed a well-placed tracheostomy tube with no  pneumothoraces and no apparent complications.  Blood loss for the  procedure was  3 ml.      Nelda Bucks, MD  Electronically Signed    DJF/MEDQ  D:  07/29/2007  T:  07/29/2007  Job:  [id number]

## 2010-09-08 NOTE — Consult Note (Signed)
NAME:  [name], [name]                 ACCOUNT NO.:  [id number]   MEDICAL RECORD NO.:  [id number]          PATIENT TYPE:  INP   LOCATION:  1223                         FACILITY:  [facility]   PHYSICIAN:  Gerrit Friends. Dietrich Pates, MD, FACCDATE OF BIRTH:  May 30, 1960   DATE OF CONSULTATION:  07/13/2007  DATE OF DISCHARGE:                                 CONSULTATION   PRIMARY CARDIOLOGIST:  Dr. Cloverdale Bing.   PRIMARY CARE PHYSICIAN:  Dr. Maple Hudson.   CONSULTING PHYSICIAN:  Critical care pulmonary.   REASON FOR CONSULTATION:  Flash pulmonary edema and respiratory failure.   HISTORY OF PRESENT ILLNESS:  This is a 50 year old African-American male  with a recently diagnosed history of non-Hodgkin's lymphoma (B cell),  also a history of hypertension, schizophrenia, who was just recently  admitted to cancer center for chemotherapy and had sudden onset of  shortness of breath.  The patient was also found to be mildly anemic.  The patient was also mildly confused.  The patient was placed on the  floor on telemetry with sudden onset of shortness of breath, respiratory  distress, was intubated and moved to ICU.  Chest x-ray revealed diffuse  edema.  We were asked to evaluate from a cardiovascular standpoint for  this etiology.  The patient is currently intubated and sedated.  Information is received from family members and through chart.   REVIEW OF SYSTEMS:  Per H&P, revealed fevers, shortness of breath, and  abdominal pain.   PAST MEDICAL HISTORY:  Recent diagnosis of non-Hodgkin's lymphoma, B  cell, hepatitis C with ascites, hypertension, schizophrenia, anemia, and  left lower lobe pneumonia.   PAST SURGICAL HISTORY:  Unknown.   SOCIAL HISTORY:  The patient lives in [place] with his mother.  He is  disabled.  He is single.  He has not had children.  No recent use of  alcohol or drugs or tobacco.   FAMILY HISTORY:  Mother with diabetes.  Father unknown.   CURRENT MEDICATIONS:  1. Allopurinol  100 mg p.o. t.i.d.  2. Cogentin.  3. Clonazepam.  4. Prolixin.  5. Megace.  6. Avelox.  7. Protonix.  8. Versed.   ALLERGIES:  No known drug allergies.   CURRENT LABS:  Hemoglobin 11.0, hematocrit 33.5, white blood cells 18.6,  platelets 405, sodium 136, potassium 4.0, chloride 108, CO2 19, BUN 5,  creatinine 0.98, glucose 93, total bili 2.4, alkaline phosphatase 1.4,  AST 92, ALT 31, total protein 7.3, albumin 2.8, lipase 4.1, CK 48, MB  0.6, troponin 0.02, LDH elevated at 903, TSH 4.35.   EKG revealing sinus tachycardia, ventricular rate of 135 beats per  minute with left atrial enlargement, low voltage, questionable prior MI  with nonspecific T-wave abnormalities.   PHYSICAL EXAMINATION:  Blood pressure 146/96, pulse 138, respirations  20, temperature 96.8, T-max 100.  HEENT:  The patient is paralyzed, sedated.  Sclerae is clear.  NECK:  Supple.  There is no JVD, no carotid bruits appreciated.  CARDIOVASCULAR:  Tachycardic with minimal systolic murmur.  LUNGS:  No rales appreciated.  SKIN:  Warm and dry.  ABDOMEN:  Distended with decreased bowel sounds.  Tympanic to  percussion.  Liver edge 2 cm to the right and pulsatile.  EXTREMITIES:  1+ edema in the ankle.  MUSCULOSKELETAL:  No joint deformity is noted.   Chest x-ray revealing diffuse airspace disease with questionable right  pleural effusion.   IMPRESSION:  1. Ventilator-dependent respiratory failure.  2. Rapidly progressive pulmonary edema with some evidence of increased      right-sided pressure.  3. Recent diagnosis of B cell non-Hodgkin's lymphoma.  4. Hepatitis C.  5. Hypertension.  6. Schizophrenia.  7. Anemia.  8. Left lower lobe pneumonia.  9. Rule out sepsis.   IMPRESSION:  This is a 50 year old African-American male who is recently  admitted for chemotherapy with a recent diagnosis of B cell non-  Hodgkin's lymphoma who had sudden episode of respiratory distress.  He  has been intubated, moved to  ICU, and is now sedated and paralyzed.  The  patient has had rapidly progressive pulmonary edema with some evidence  for elevated right-sided pressure by exam in the setting of intravenous  hydration,  questionable cardiogenic source, pneumonia, SIRS.  Reportedly he has a  normal ejection fraction per MUGA scan less than 24 hours ago.  We will  check echocardiogram, BNP, and cardiac markers, maintain gentle diuresis  for now and follow closely.      Bettey Mare. Lyman Bishop, NP      Gerrit Friends. Dietrich Pates, MD, [facility]  Electronically Signed    KML/MEDQ  D:  07/13/2007  T:  07/13/2007  Job:  [id number]   cc:   Maple Hudson, MD

## 2010-09-08 NOTE — Consult Note (Signed)
NAME:  [name], [name]                 ACCOUNT NO.:  [id number]   MEDICAL RECORD NO.:  [id number]          PATIENT TYPE:  INP   LOCATION:  1223                         FACILITY:  [facility]   PHYSICIAN:  Anselm Jungling, MD  DATE OF BIRTH:  26-Oct-1960   DATE OF CONSULTATION:  08/07/2007  DATE OF DISCHARGE:                                 CONSULTATION   IDENTIFYING DATA AND REASON FOR ADMISSION:  This is my first contact  with [name], a 50 year old African American male currently being  treated in the intensive care unit here at [facility].  Psychiatric consultation is requested because of his history of  schizophrenia and psychotropic medications.   HISTORY OF PRESENTING PROBLEMS:  This is a very unfortunate gentleman  who has recently been diagnosed with Hodgkin's lymphoma.  He also has a  history of hepatitis C, insulin dependent diabetes mellitus, and  hypertension.  He also has a history of hepatic encephalopathy, and has  developed sepsis with acute respiratory this for distress syndrome, and  respiratory failure.  He currently has a tracheostomy.   He has a reported history of schizophrenia, although not very much is  known about this, including the identity of his current psychiatric  [name].  Nonetheless, he is currently on a regimen of Clozaril 300 mg  daily, Prolixin 1 mg nightly and Cogentin 0.5 mg b.i.d.  In addition, he  has on order p.r.n. Ativan and Klonopin.  Other medications that are not  psychotropic in nature include morphine sulfate, prednisone, and  Protonix.   In my review of the medical record, it appears that the patient has been  referred for psychiatric consultation, not because of any behavioral or  mental abnormality at this time, but rather because of his history of  psychotropic medication treatment and schizophrenia.   MENTAL STATUS AND OBSERVATIONS:  The patient is a slender, but  apparently normally-developed adult [place] American male.   He is in the  recliner chair in his intensive care unit, with staff at hand attending  to him.  He has a tracheostomy.  He is clearly having some difficulty  breathing, and is expectorating vigorously.  When I am able to speak to  him, he appears very tired and haggard.  He is not able to respond to me  verbally.  He does make eye contact, and at times is able to respond to  some yes/no questions with a nod of the head.  When I ask him if he is  aware of what is going on around him, he shakes his head no.  I was not  really able to get much out of him otherwise in terms of responses to  questions that I asked him.  I explained to him that I was a  psychiatrist that was reviewing his psychiatric medication and he  appeared to comprehend this at least.   IMPRESSION:  This unfortunate gentleman has a history of schizophrenia,  but at this time is in dire medical straits with multiple, severe and  acute medical problems.   He is currently  on a regimen of Clozaril 300 mg daily with Prolixin 1 mg  nightly, with p.r.n. Klonopin and Ativan.   At this time there is no evidence of signs or symptoms of active mental  disorder.  Of course it is extremely difficult to assess his mental  status under the circumstances.  However, I see no reason to change his  current medication regimen or to add anything in the way psychotropic  medications to it.  I believe that it would be reasonable to use Ativan  and/or Klonopin liberally.   It might be reasonable to get a Clozaril level with his next scheduled  blood draw, just to make sure that his Clozaril level is not higher than  it needs to be.   Clozaril does have the capacity to cause a depression of blood cell  indices, and this might be considered as well in monitoring and managing  his Clozaril dose and blood level in coming days and weeks.   Please do not hesitate to contact me if I can be of further service.      Anselm Jungling, MD   Electronically Signed     SPB/MEDQ  D:  08/07/2007  T:  08/07/2007  Job:  [id number]

## 2010-10-02 ENCOUNTER — Encounter: Payer: Self-pay | Admitting: Internal Medicine

## 2010-10-14 ENCOUNTER — Telehealth: Payer: Self-pay | Admitting: Licensed Clinical Social Worker

## 2010-10-14 NOTE — Telephone Encounter (Signed)
Called [facility] and patient was evaluated for services and connected with [facility] per referral signed by Sinda Du.

## 2010-11-13 ENCOUNTER — Encounter: Payer: Self-pay | Admitting: Internal Medicine

## 2010-11-13 ENCOUNTER — Ambulatory Visit (INDEPENDENT_AMBULATORY_CARE_PROVIDER_SITE_OTHER): Payer: Medicare Other | Admitting: Internal Medicine

## 2010-11-13 DIAGNOSIS — A088 Other specified intestinal infections: Secondary | ICD-10-CM

## 2010-11-13 DIAGNOSIS — B349 Viral infection, unspecified: Secondary | ICD-10-CM | POA: Insufficient documentation

## 2010-11-13 DIAGNOSIS — E785 Hyperlipidemia, unspecified: Secondary | ICD-10-CM | POA: Insufficient documentation

## 2010-11-13 DIAGNOSIS — Z5181 Encounter for therapeutic drug level monitoring: Secondary | ICD-10-CM | POA: Insufficient documentation

## 2010-11-13 DIAGNOSIS — Z8639 Personal history of other endocrine, nutritional and metabolic disease: Secondary | ICD-10-CM | POA: Insufficient documentation

## 2010-11-13 DIAGNOSIS — A084 Viral intestinal infection, unspecified: Secondary | ICD-10-CM

## 2010-11-13 DIAGNOSIS — Z13220 Encounter for screening for lipoid disorders: Secondary | ICD-10-CM | POA: Insufficient documentation

## 2010-11-13 DIAGNOSIS — Z862 Personal history of diseases of the blood and blood-forming organs and certain disorders involving the immune mechanism: Secondary | ICD-10-CM

## 2010-11-13 LAB — COMPREHENSIVE METABOLIC PANEL
ALT: 43 U/L (ref 0–53)
AST: 70 U/L — ABNORMAL HIGH (ref 0–37)
Albumin: 3.7 g/dL (ref 3.5–5.2)
Alkaline Phosphatase: 67 U/L (ref 39–117)
BUN: 11 mg/dL (ref 6–23)
CO2: 25 mEq/L (ref 19–32)
Calcium: 9.2 mg/dL (ref 8.4–10.5)
Chloride: 105 mEq/L (ref 96–112)
Creat: 1.04 mg/dL (ref 0.50–1.35)
Glucose, Bld: 100 mg/dL — ABNORMAL HIGH (ref 70–99)
Potassium: 4.5 mEq/L (ref 3.5–5.3)
Sodium: 142 mEq/L (ref 135–145)
Total Bilirubin: 5.2 mg/dL — ABNORMAL HIGH (ref 0.3–1.2)
Total Protein: 7.2 g/dL (ref 6.0–8.3)

## 2010-11-13 LAB — LIPID PANEL
Cholesterol: 141 mg/dL (ref 0–200)
HDL: 29 mg/dL — ABNORMAL LOW (ref >39)
LDL Cholesterol: 78 mg/dL (ref 0–99)
Total CHOL/HDL Ratio: 4.9 Ratio
Triglycerides: 169 mg/dL — ABNORMAL HIGH (ref <150)
VLDL: 34 mg/dL (ref 0–40)

## 2010-11-13 LAB — POCT GLYCOSYLATED HEMOGLOBIN (HGB A1C): Hemoglobin A1C: 4.6

## 2010-11-13 NOTE — Progress Notes (Deleted)
  Subjective:    Patient ID: [name], male    DOB: March 02, 1961, 50 y.o.   MRN: [id number]  HPI    Review of Systems     Objective:   Physical Exam        Assessment & Plan:

## 2010-11-13 NOTE — Patient Instructions (Signed)
Viral Infections Viruses can cause:  Minor sore throats.  Aches and pains.   Runny nose.   Watery eyes.  Tiredness.   Coughs.   Feeling sick to your stomach (nauseous).  Throwing up (vomiting).   Watery poop (diarrhea).    These problems (symptoms) are not cured with medicine (antibiotics) that kills germs. HOME CARE  Only take medicine as told by your doctor.   If your doctor says it is okay, you can use over-the-counter medicine for watery poop.   Drink enough water and fluids to keep the pee (urine) clear or pale yellow. Use electrolyte fluids for babies and young children as told by your doctor.  GET HELP IF:  The neck glands are puffy (swollen).   You or your child has a temperature by mouth above 101F.   Your baby is older than 3 months with a rectal temperature of 100.5 F (38.1 C) or higher for more than 1 day.   There is tenderness over the sinuses.   You or your child is not feeling partly better after 3 days.   You or your child develops muscle aches.   You or your child is feeling more tired than expected.   There is a persistent cough with mucus.  GET HELP RIGHT AWAY IF:  You or your child has a sore throat with fluid (pus) and trouble swallowing.   You or your child has a temperature by mouth above 101F, not controlled by medicine.   Your baby is older than 3 months with a rectal temperature of 102 F (38.9 C) or higher.   Your baby is 45 months old or younger with a rectal temperature of 100.4 F (38 C) or higher.   You or your child has a very bad headache.   You or your child has shortness of breath.   There is chest or neck pain.   There is an unusual rash.  MAKE SURE YOU:  Understand these instructions.   Will watch this condition.   Will get help right away if you or your child is not doing well or gets worse.  Document Released: 03/25/2008 Document Re-Released: 09/30/2009 [facility] Patient Information 2011 [place],  [place].  1. Stop in the lab to have your blood drawn.  If we need to follow up on the results I will call you.  2. Follow up with me in 6 months to see how your doing.

## 2010-11-13 NOTE — Progress Notes (Signed)
Subjective:    Patient ID: [name], male    DOB: 1961-02-26, 50 y.o.   MRN: [id number]  HPI  [name] is a 50 year old man who presents to clinic today because of feeling generally unwell since Monday.  He states that he has had nausea with a few episodes of vomiting, decreased appetite, headaches, and some general muscle soreness since Monday.  He denies any diarrhea, measured fever, or sick contacts.  He does state that his urine had been darker for the last few days but when he started drinking more water it cleared up.    Past Medical History  Diagnosis Date  . Hepatitis C     not treatable  . Hypertension   . Schizophrenia   . Hemorrhoids   . Rectal bleeding   . Abdominal pain   . Non Hodgkin's lymphoma    Current Outpatient Prescriptions on File Prior to Visit  Medication Sig Dispense Refill  . OLANZapine (ZYPREXA) 20 MG tablet Take 20 mg by mouth at bedtime.         No family history on file.  History   Social History  . Marital Status: Single    Spouse Name: N/A    Number of Children: N/A  . Years of Education: N/A   Social History Main Topics  . Smoking status: Former Games developer  . Smokeless tobacco: None  . Alcohol Use: No  . Drug Use: No  . Sexually Active: None   Other Topics Concern  . None   Social History Narrative  . None   Review of Systems    Constitutional: Positive for appetite change and fatigue.  Denies fever, chills, and diaphoresis HEENT: Denies photophobia, eye pain, redness, hearing loss, ear pain, congestion, sore throat, rhinorrhea, sneezing, mouth sores, trouble swallowing, neck pain, neck stiffness and tinnitus.   Respiratory: Denies SOB, DOE, cough, chest tightness,  and wheezing.   Cardiovascular: Denies chest pain, palpitations and leg swelling.  Gastrointestinal: Positive for nausea, vomiting.  Denies abdominal pain, diarrhea, constipation, blood in stool and abdominal distention.  Genitourinary: Denies dysuria, urgency,  frequency, hematuria, flank pain and difficulty urinating.  Musculoskeletal: Positive for myalgias.  Denies back pain, joint swelling, arthralgias and gait problem.  Skin: Denies pallor, rash and wound.  Neurological: Denies dizziness, seizures, syncope, weakness, light-headedness, numbness and headaches.  Hematological: Denies adenopathy. Easy bruising, personal or family bleeding history  Psychiatric/Behavioral: Denies suicidal ideation, mood changes, confusion, nervousness, sleep disturbance and agitation  Objective:   Physical Exam  Filed Vitals:   11/13/10 1321  BP: 130/78  Pulse: 78  Temp: 98.5 F (36.9 C)  TempSrc: Oral  Height: 5\' 8"  (1.727 m)  Weight: 208 lb (94.348 kg)  SpO2: 97%     Constitutional: Vital signs reviewed.  Patient is a well-developed and well-nourished man in no acute distress and cooperative with exam. Alert and oriented x3.  Head: Normocephalic and atraumatic Ear: TM normal bilaterally Mouth: no erythema or exudates, MMM Eyes: PERRL, EOMI, conjunctivae normal, No scleral icterus.  Neck: Supple, Trachea midline normal ROM, No JVD, mass, thyromegaly, or carotid bruit present.  Cardiovascular: RRR, S1 normal, S2 normal, no MRG, pulses symmetric and intact bilaterally Pulmonary/Chest: CTAB, no wheezes, rales, or rhonchi Abdominal: Soft. Non-tender, non-distended, bowel sounds are normal, no masses, organomegaly, or guarding present.  GU: no CVA tenderness Musculoskeletal: No joint deformities, erythema, or stiffness, ROM full and no nontender Hematology: no cervical, inginal, or axillary adenopathy.  Neurological: A&O x3, Strenght is normal  and symmetric bilaterally, cranial nerve II-XII are grossly intact, no focal motor deficit, sensory intact to light touch bilaterally.  Skin: Warm, dry and intact. No rash, cyanosis, or clubbing.  Psychiatric: Normal mood and affect. speech and behavior is normal. Judgment and thought content normal. Cognition and memory  are normal.    Assessment & Plan:

## 2010-11-14 NOTE — Assessment & Plan Note (Signed)
His symptoms are consistent with a viral illness.  He does have a history of Hepatitis C but no new risk factors for viral hepatitis and currently has no signs of reactivation of his Hep C.  He also has been getting better for the last day so we will continue to follow and if he does not continue to improve we will reassess at that time.

## 2010-11-14 NOTE — Assessment & Plan Note (Signed)
Lab Results  Component Value Date   HGBA1C 4.6 11/13/2010   Lab Results  Component Value Date   MICROALBUR 0.50 07/10/2008   LDLCALC 78 11/13/2010   CREATININE 1.04 11/13/2010   He currently has no signs of metabolic syndrome due to his Zyprexa use.  We will occasionally recheck per the routine screening time periods for his age group.

## 2010-11-14 NOTE — Assessment & Plan Note (Signed)
Lab Results  Component Value Date   CHOL 141 11/13/2010   Lab Results  Component Value Date   HDL 29* 11/13/2010   Lab Results  Component Value Date   LDLCALC 78 11/13/2010   Lab Results  Component Value Date   TRIG 169* 11/13/2010   Lab Results  Component Value Date   CHOLHDL 4.9 11/13/2010   No results found for this basename: LDLDIRECT   He has no indication of hyperlipidemia on testing today.  We will continue to monitor on the usual frequency based on his age and risk factors.

## 2010-12-11 ENCOUNTER — Inpatient Hospital Stay (HOSPITAL_COMMUNITY)
Admission: RE | Admit: 2010-12-11 | Discharge: 2010-12-11 | Payer: Medicare Other | Source: Ambulatory Visit | Attending: Internal Medicine | Admitting: Internal Medicine

## 2010-12-18 ENCOUNTER — Other Ambulatory Visit: Payer: Self-pay | Admitting: Internal Medicine

## 2010-12-18 ENCOUNTER — Encounter (HOSPITAL_BASED_OUTPATIENT_CLINIC_OR_DEPARTMENT_OTHER): Payer: Medicare Other | Admitting: Internal Medicine

## 2010-12-18 ENCOUNTER — Encounter (HOSPITAL_COMMUNITY): Payer: Self-pay

## 2010-12-18 ENCOUNTER — Ambulatory Visit (HOSPITAL_COMMUNITY)
Admission: RE | Admit: 2010-12-18 | Discharge: 2010-12-18 | Disposition: A | Discharge status: Home / Self Care | Payer: Medicare Other | Source: Ambulatory Visit | Attending: Internal Medicine | Admitting: Internal Medicine

## 2010-12-18 ENCOUNTER — Ambulatory Visit (HOSPITAL_COMMUNITY): Payer: Medicare Other

## 2010-12-18 DIAGNOSIS — C8590 Non-Hodgkin lymphoma, unspecified, unspecified site: Secondary | ICD-10-CM

## 2010-12-18 DIAGNOSIS — C8589 Other specified types of non-Hodgkin lymphoma, extranodal and solid organ sites: Secondary | ICD-10-CM

## 2010-12-18 DIAGNOSIS — R161 Splenomegaly, not elsewhere classified: Secondary | ICD-10-CM | POA: Insufficient documentation

## 2010-12-18 DIAGNOSIS — Z9221 Personal history of antineoplastic chemotherapy: Secondary | ICD-10-CM | POA: Insufficient documentation

## 2010-12-18 DIAGNOSIS — R160 Hepatomegaly, not elsewhere classified: Secondary | ICD-10-CM | POA: Insufficient documentation

## 2010-12-18 DIAGNOSIS — D709 Neutropenia, unspecified: Secondary | ICD-10-CM

## 2010-12-18 HISTORY — DX: Malignant (primary) neoplasm, unspecified: C80.1

## 2010-12-18 LAB — CMP (CANCER CENTER ONLY)
ALT(SGPT): 82 U/L — ABNORMAL HIGH (ref 10–47)
AST: 145 U/L — ABNORMAL HIGH (ref 11–38)
Albumin: 3.2 g/dL — ABNORMAL LOW (ref 3.3–5.5)
Alkaline Phosphatase: 104 U/L — ABNORMAL HIGH (ref 26–84)
BUN, Bld: 9 mg/dL (ref 7–22)
CO2: 26 mEq/L (ref 18–33)
Calcium: 8.8 mg/dL (ref 8.0–10.3)
Chloride: 103 mEq/L (ref 98–108)
Creat: 1.2 mg/dl (ref 0.6–1.2)
Glucose, Bld: 106 mg/dL (ref 73–118)
Potassium: 4.5 mEq/L (ref 3.3–4.7)
Sodium: 139 mEq/L (ref 128–145)
Total Bilirubin: 2.4 mg/dl — ABNORMAL HIGH (ref 0.20–1.60)
Total Protein: 7.2 g/dL (ref 6.4–8.1)

## 2010-12-18 LAB — CBC WITH DIFFERENTIAL/PLATELET
BASO%: 1.4 % (ref 0.0–2.0)
Basophils Absolute: 0 10*3/uL (ref 0.0–0.1)
EOS%: 2 % (ref 0.0–7.0)
Eosinophils Absolute: 0.1 10*3/uL (ref 0.0–0.5)
HCT: 41.4 % (ref 38.4–49.9)
HGB: 14.1 g/dL (ref 13.0–17.1)
LYMPH%: 30.6 % (ref 14.0–49.0)
MCH: 30 pg (ref 27.2–33.4)
MCHC: 34.2 g/dL (ref 32.0–36.0)
MCV: 87.8 fL (ref 79.3–98.0)
MONO#: 0.4 10*3/uL (ref 0.1–0.9)
MONO%: 12.8 % (ref 0.0–14.0)
NEUT#: 1.6 10*3/uL (ref 1.5–6.5)
NEUT%: 53.2 % (ref 39.0–75.0)
Platelets: 95 10*3/uL — ABNORMAL LOW (ref 140–400)
RBC: 4.72 10*6/uL (ref 4.20–5.82)
RDW: 13.8 % (ref 11.0–14.6)
WBC: 3 10*3/uL — ABNORMAL LOW (ref 4.0–10.3)
lymph#: 0.9 10*3/uL (ref 0.9–3.3)

## 2010-12-18 LAB — LACTATE DEHYDROGENASE: LDH: 168 U/L (ref 94–250)

## 2010-12-18 MED ORDER — IOHEXOL 300 MG/ML  SOLN
100.0000 mL | Freq: Once | INTRAMUSCULAR | Status: AC | PRN
  Administered 2010-12-18: 100 mL via INTRAVENOUS

## 2010-12-23 ENCOUNTER — Encounter (HOSPITAL_BASED_OUTPATIENT_CLINIC_OR_DEPARTMENT_OTHER): Payer: Medicare Other | Admitting: Internal Medicine

## 2010-12-23 DIAGNOSIS — C8589 Other specified types of non-Hodgkin lymphoma, extranodal and solid organ sites: Secondary | ICD-10-CM

## 2011-01-15 LAB — CBC
HCT: 33.7 — ABNORMAL LOW
Hemoglobin: 10.9 — ABNORMAL LOW
MCHC: 32.5
MCV: 73.3 — ABNORMAL LOW
Platelets: 318
RBC: 4.6
RDW: 15.4
WBC: 10.2

## 2011-01-15 LAB — PROTIME-INR
INR: 1
Prothrombin Time: 13.2

## 2011-01-18 LAB — BLOOD GAS, ARTERIAL
Acid-Base Excess: 0.7
Acid-Base Excess: 1.8
Acid-Base Excess: 2
Acid-Base Excess: 2.1 — ABNORMAL HIGH
Acid-Base Excess: 2.7 — ABNORMAL HIGH
Acid-Base Excess: 2.8 — ABNORMAL HIGH
Acid-Base Excess: 3.1 — ABNORMAL HIGH
Acid-Base Excess: 3.5 — ABNORMAL HIGH
Acid-Base Excess: 4.2 — ABNORMAL HIGH
Acid-Base Excess: 4.7 — ABNORMAL HIGH
Acid-Base Excess: 5.2 — ABNORMAL HIGH
Acid-Base Excess: 5.9 — ABNORMAL HIGH
Acid-base deficit: 1.7
Acid-base deficit: 5.8 — ABNORMAL HIGH
Acid-base deficit: 8.1 — ABNORMAL HIGH
Bicarbonate: 17.5 — ABNORMAL LOW
Bicarbonate: 18.6 — ABNORMAL LOW
Bicarbonate: 22.1
Bicarbonate: 23.2
Bicarbonate: 24.9 — ABNORMAL HIGH
Bicarbonate: 25.3 — ABNORMAL HIGH
Bicarbonate: 25.8 — ABNORMAL HIGH
Bicarbonate: 26 — ABNORMAL HIGH
Bicarbonate: 26.1 — ABNORMAL HIGH
Bicarbonate: 26.2 — ABNORMAL HIGH
Bicarbonate: 27 — ABNORMAL HIGH
Bicarbonate: 28.3 — ABNORMAL HIGH
Bicarbonate: 28.4 — ABNORMAL HIGH
Bicarbonate: 29.3 — ABNORMAL HIGH
Bicarbonate: 29.4 — ABNORMAL HIGH
Drawn by: 103701
Drawn by: 129801
Drawn by: 129801
Drawn by: 145321
Drawn by: 187461
Drawn by: 229971
Drawn by: 232811
Drawn by: 232811
Drawn by: 295541
Drawn by: 295541
FIO2: 0.4
FIO2: 0.4
FIO2: 0.4
FIO2: 0.4
FIO2: 0.4
FIO2: 0.4
FIO2: 0.5
FIO2: 0.5
FIO2: 0.5
FIO2: 0.5
FIO2: 0.5
FIO2: 0.5
FIO2: 0.5
FIO2: 0.5
FIO2: 0.7
MECHVT: 400
MECHVT: 400
MECHVT: 400
MECHVT: 400
MECHVT: 400
MECHVT: 400
MECHVT: 410
MECHVT: 410
MECHVT: 470
MECHVT: 480
MECHVT: 500
MECHVT: 500
MECHVT: 530
MECHVT: 550
MECHVT: 550
O2 Saturation: 84.4
O2 Saturation: 89.4
O2 Saturation: 90.9
O2 Saturation: 91.5
O2 Saturation: 92.3
O2 Saturation: 94.4
O2 Saturation: 96.2
O2 Saturation: 97.1
O2 Saturation: 97.5
O2 Saturation: 97.5
O2 Saturation: 97.6
O2 Saturation: 98.1
O2 Saturation: 98.4
O2 Saturation: 99.2
O2 Saturation: 99.7
PEEP: 0.8
PEEP: 0.8
PEEP: 10
PEEP: 10
PEEP: 10
PEEP: 10
PEEP: 10
PEEP: 10
PEEP: 5
PEEP: 5
PEEP: 5
PEEP: 5
PEEP: 8
PEEP: 8
PEEP: 8
Patient temperature: 100.8
Patient temperature: 97.1
Patient temperature: 98.6
Patient temperature: 98.6
Patient temperature: 98.6
Patient temperature: 98.6
Patient temperature: 98.6
Patient temperature: 98.6
Patient temperature: 98.6
Patient temperature: 98.6
Patient temperature: 98.8
Patient temperature: 98.8
Patient temperature: 99
Patient temperature: 99.5
Patient temperature: 99.5
RATE: 14
RATE: 24
RATE: 26
RATE: 26
RATE: 26
RATE: 28
RATE: 28
RATE: 30
RATE: 31
RATE: 35
RATE: 35
RATE: 35
RATE: 35
RATE: 35
RATE: 35
TCO2: 16.4
TCO2: 17.3
TCO2: 20.5
TCO2: 21.1
TCO2: 22.9
TCO2: 23.2
TCO2: 23.8
TCO2: 23.9
TCO2: 23.9
TCO2: 24.1
TCO2: 24.8
TCO2: 25.7
TCO2: 26.1
TCO2: 26.6
TCO2: 26.6
pCO2 arterial: 30.8 — ABNORMAL LOW
pCO2 arterial: 34.3 — ABNORMAL LOW
pCO2 arterial: 34.9 — ABNORMAL LOW
pCO2 arterial: 36.2
pCO2 arterial: 36.3
pCO2 arterial: 36.5
pCO2 arterial: 37.4
pCO2 arterial: 38.2
pCO2 arterial: 38.3
pCO2 arterial: 38.3
pCO2 arterial: 38.7
pCO2 arterial: 39.4
pCO2 arterial: 39.7
pCO2 arterial: 41.1
pCO2 arterial: 43.5
pH, Arterial: 7.282 — ABNORMAL LOW
pH, Arterial: 7.352
pH, Arterial: 7.4
pH, Arterial: 7.444
pH, Arterial: 7.446
pH, Arterial: 7.448
pH, Arterial: 7.452 — ABNORMAL HIGH
pH, Arterial: 7.453 — ABNORMAL HIGH
pH, Arterial: 7.459 — ABNORMAL HIGH
pH, Arterial: 7.459 — ABNORMAL HIGH
pH, Arterial: 7.467 — ABNORMAL HIGH
pH, Arterial: 7.467 — ABNORMAL HIGH
pH, Arterial: 7.475 — ABNORMAL HIGH
pH, Arterial: 7.489 — ABNORMAL HIGH
pH, Arterial: 7.491 — ABNORMAL HIGH
pO2, Arterial: 124 — ABNORMAL HIGH
pO2, Arterial: 172 — ABNORMAL HIGH
pO2, Arterial: 51.1 — ABNORMAL LOW
pO2, Arterial: 58.9 — ABNORMAL LOW
pO2, Arterial: 61.9 — ABNORMAL LOW
pO2, Arterial: 62.2 — ABNORMAL LOW
pO2, Arterial: 65.4 — ABNORMAL LOW
pO2, Arterial: 66.3 — ABNORMAL LOW
pO2, Arterial: 80.9
pO2, Arterial: 89.8
pO2, Arterial: 91
pO2, Arterial: 91.1
pO2, Arterial: 96.1
pO2, Arterial: 96.1
pO2, Arterial: 97.8

## 2011-01-18 LAB — URINE CULTURE
Colony Count: NO GROWTH
Colony Count: NO GROWTH
Culture: NO GROWTH
Culture: NO GROWTH
Special Requests: NEGATIVE
Special Requests: NEGATIVE

## 2011-01-18 LAB — GRAM STAIN: Gram Stain: NONE SEEN

## 2011-01-18 LAB — BASIC METABOLIC PANEL
BUN: 10
BUN: 7
BUN: 8
BUN: 15
BUN: 18
BUN: 19
BUN: 20
BUN: 21
BUN: 24 — ABNORMAL HIGH
BUN: 24 — ABNORMAL HIGH
BUN: 5 — ABNORMAL LOW
CO2: 17 — ABNORMAL LOW
CO2: 18 — ABNORMAL LOW
CO2: 18 — ABNORMAL LOW
CO2: 19
CO2: 27
CO2: 27
CO2: 29
CO2: 30
CO2: 30
CO2: 31
CO2: 33 — ABNORMAL HIGH
Calcium: 9.1
Calcium: 9.1
Calcium: 9.9
Calcium: 8.1 — ABNORMAL LOW
Calcium: 8.2 — ABNORMAL LOW
Calcium: 8.4
Calcium: 8.5
Calcium: 8.6
Calcium: 8.6
Calcium: 8.7
Calcium: 9.1
Chloride: 103
Chloride: 107
Chloride: 98
Chloride: 103
Chloride: 103
Chloride: 104
Chloride: 107
Chloride: 108
Chloride: 108
Chloride: 109
Chloride: 109
Creatinine, Ser: 0.94
Creatinine, Ser: 1.02
Creatinine, Ser: 1.15
Creatinine, Ser: 0.79
Creatinine, Ser: 0.82
Creatinine, Ser: 0.86
Creatinine, Ser: 0.93
Creatinine, Ser: 0.94
Creatinine, Ser: 0.98
Creatinine, Ser: 1.04
Creatinine, Ser: 1.06
GFR calc Af Amer: >60
GFR calc Af Amer: >60
GFR calc Af Amer: >60
GFR calc Af Amer: >60
GFR calc Af Amer: >60
GFR calc Af Amer: >60
GFR calc Af Amer: >60
GFR calc Af Amer: >60
GFR calc Af Amer: >60
GFR calc Af Amer: >60
GFR calc Af Amer: >60
GFR calc non Af Amer: >60
GFR calc non Af Amer: >60
GFR calc non Af Amer: >60
GFR calc non Af Amer: >60
GFR calc non Af Amer: >60
GFR calc non Af Amer: >60
GFR calc non Af Amer: >60
GFR calc non Af Amer: >60
GFR calc non Af Amer: >60
GFR calc non Af Amer: >60
GFR calc non Af Amer: >60
Glucose, Bld: 102 — ABNORMAL HIGH
Glucose, Bld: 87
Glucose, Bld: 89
Glucose, Bld: 103 — ABNORMAL HIGH
Glucose, Bld: 109 — ABNORMAL HIGH
Glucose, Bld: 117 — ABNORMAL HIGH
Glucose, Bld: 123 — ABNORMAL HIGH
Glucose, Bld: 125 — ABNORMAL HIGH
Glucose, Bld: 134 — ABNORMAL HIGH
Glucose, Bld: 150 — ABNORMAL HIGH
Glucose, Bld: 93
Potassium: 3.8
Potassium: 3.8
Potassium: 3.8
Potassium: 3.3 — ABNORMAL LOW
Potassium: 3.7
Potassium: 3.8
Potassium: 4
Potassium: 4
Potassium: 4
Potassium: 4.2
Potassium: 4.3
Sodium: 132 — ABNORMAL LOW
Sodium: 133 — ABNORMAL LOW
Sodium: 136
Sodium: 136
Sodium: 139
Sodium: 142
Sodium: 144
Sodium: 144
Sodium: 145
Sodium: 146 — ABNORMAL HIGH
Sodium: 147 — ABNORMAL HIGH

## 2011-01-18 LAB — HEPATIC FUNCTION PANEL
ALT: 31
AST: 92 — ABNORMAL HIGH
Albumin: 2.8 — ABNORMAL LOW
Alkaline Phosphatase: 189 — ABNORMAL HIGH
Bilirubin, Direct: 1.4 — ABNORMAL HIGH
Indirect Bilirubin: 1 — ABNORMAL HIGH
Total Bilirubin: 2.4 — ABNORMAL HIGH
Total Protein: 7.3

## 2011-01-18 LAB — CBC
HCT: 31.7 — ABNORMAL LOW
HCT: 35 — ABNORMAL LOW
HCT: 28.2 — ABNORMAL LOW
HCT: 31.3 — ABNORMAL LOW
HCT: 31.3 — ABNORMAL LOW
HCT: 32.1 — ABNORMAL LOW
HCT: 32.6 — ABNORMAL LOW
HCT: 33.2 — ABNORMAL LOW
HCT: 33.5 — ABNORMAL LOW
HCT: 34.6 — ABNORMAL LOW
HCT: 35.1 — ABNORMAL LOW
HCT: 35.6 — ABNORMAL LOW
HCT: 35.9 — ABNORMAL LOW
HCT: 36.2 — ABNORMAL LOW
HCT: 37 — ABNORMAL LOW
Hemoglobin: 10.4 — ABNORMAL LOW
Hemoglobin: 11.2 — ABNORMAL LOW
Hemoglobin: 10.3 — ABNORMAL LOW
Hemoglobin: 10.6 — ABNORMAL LOW
Hemoglobin: 10.7 — ABNORMAL LOW
Hemoglobin: 11 — ABNORMAL LOW
Hemoglobin: 11 — ABNORMAL LOW
Hemoglobin: 11.3 — ABNORMAL LOW
Hemoglobin: 11.6 — ABNORMAL LOW
Hemoglobin: 11.6 — ABNORMAL LOW
Hemoglobin: 11.8 — ABNORMAL LOW
Hemoglobin: 11.9 — ABNORMAL LOW
Hemoglobin: 12 — ABNORMAL LOW
Hemoglobin: 8.9 — ABNORMAL LOW
Hemoglobin: 9.8 — ABNORMAL LOW
MCHC: 32
MCHC: 32.8
MCHC: 31.4
MCHC: 31.6
MCHC: 31.8
MCHC: 32.4
MCHC: 32.6
MCHC: 32.7
MCHC: 32.7
MCHC: 32.8
MCHC: 32.9
MCHC: 32.9
MCHC: 33.2
MCHC: 33.4
MCHC: 33.6
MCV: 71.8 — ABNORMAL LOW
MCV: 71.9 — ABNORMAL LOW
MCV: 71.1 — ABNORMAL LOW
MCV: 71.3 — ABNORMAL LOW
MCV: 71.5 — ABNORMAL LOW
MCV: 71.6 — ABNORMAL LOW
MCV: 73 — ABNORMAL LOW
MCV: 74.6 — ABNORMAL LOW
MCV: 74.7 — ABNORMAL LOW
MCV: 74.8 — ABNORMAL LOW
MCV: 75.1 — ABNORMAL LOW
MCV: 75.2 — ABNORMAL LOW
MCV: 75.2 — ABNORMAL LOW
MCV: 75.4 — ABNORMAL LOW
MCV: 75.6 — ABNORMAL LOW
Platelets: 370
Platelets: 377
Platelets: 271
Platelets: 277
Platelets: 293
Platelets: 304
Platelets: 332
Platelets: 334
Platelets: 350
Platelets: 359
Platelets: 360
Platelets: 363
Platelets: 370
Platelets: 388
Platelets: 405 — ABNORMAL HIGH
RBC: 4.41
RBC: 4.86
RBC: 3.86 — ABNORMAL LOW
RBC: 4.28
RBC: 4.34
RBC: 4.36
RBC: 4.37
RBC: 4.64
RBC: 4.65
RBC: 4.67
RBC: 4.7
RBC: 4.76
RBC: 4.76
RBC: 4.85
RBC: 4.91
RDW: 16 — ABNORMAL HIGH
RDW: 16.3 — ABNORMAL HIGH
RDW: 16.1 — ABNORMAL HIGH
RDW: 16.5 — ABNORMAL HIGH
RDW: 16.6 — ABNORMAL HIGH
RDW: 16.6 — ABNORMAL HIGH
RDW: 16.7 — ABNORMAL HIGH
RDW: 17.1 — ABNORMAL HIGH
RDW: 17.7 — ABNORMAL HIGH
RDW: 18.3 — ABNORMAL HIGH
RDW: 18.5 — ABNORMAL HIGH
RDW: 18.6 — ABNORMAL HIGH
RDW: 18.8 — ABNORMAL HIGH
RDW: 19.2 — ABNORMAL HIGH
RDW: 19.4 — ABNORMAL HIGH
WBC: 11.1 — ABNORMAL HIGH
WBC: 12.9 — ABNORMAL HIGH
WBC: 10.2
WBC: 12.3 — ABNORMAL HIGH
WBC: 12.4 — ABNORMAL HIGH
WBC: 12.8 — ABNORMAL HIGH
WBC: 17 — ABNORMAL HIGH
WBC: 17.3 — ABNORMAL HIGH
WBC: 17.5 — ABNORMAL HIGH
WBC: 18.6 — ABNORMAL HIGH
WBC: 18.6 — ABNORMAL HIGH
WBC: 21.2 — ABNORMAL HIGH
WBC: 22.4 — ABNORMAL HIGH
WBC: 23.4 — ABNORMAL HIGH
WBC: 25.1 — ABNORMAL HIGH

## 2011-01-18 LAB — LACTATE DEHYDROGENASE
LDH: 903 — ABNORMAL HIGH
LDH: 379 — ABNORMAL HIGH
LDH: 411 — ABNORMAL HIGH
LDH: 464 — ABNORMAL HIGH
LDH: 490 — ABNORMAL HIGH
LDH: 608 — ABNORMAL HIGH
LDH: 660 — ABNORMAL HIGH
LDH: 773 — ABNORMAL HIGH
LDH: 892 — ABNORMAL HIGH

## 2011-01-18 LAB — DIFFERENTIAL
Basophils Absolute: 0.1
Basophils Absolute: 0
Basophils Absolute: 0
Basophils Absolute: 0
Basophils Relative: 1
Basophils Relative: 0
Basophils Relative: 0
Basophils Relative: 0
Eosinophils Absolute: 0
Eosinophils Absolute: 0
Eosinophils Absolute: 0
Eosinophils Absolute: 0
Eosinophils Relative: 0
Eosinophils Relative: 0
Eosinophils Relative: 0
Eosinophils Relative: 0
Lymphocytes Relative: 8 — ABNORMAL LOW
Lymphocytes Relative: 5 — ABNORMAL LOW
Lymphocytes Relative: 5 — ABNORMAL LOW
Lymphocytes Relative: 6 — ABNORMAL LOW
Lymphs Abs: 1
Lymphs Abs: 1
Lymphs Abs: 1.1
Lymphs Abs: 1.5
Monocytes Absolute: 2 — ABNORMAL HIGH
Monocytes Absolute: 2 — ABNORMAL HIGH
Monocytes Absolute: 2 — ABNORMAL HIGH
Monocytes Absolute: 2.4 — ABNORMAL HIGH
Monocytes Relative: 16 — ABNORMAL HIGH
Monocytes Relative: 11
Monocytes Relative: 11
Monocytes Relative: 8
Neutro Abs: 9.8 — ABNORMAL HIGH
Neutro Abs: 15.6 — ABNORMAL HIGH
Neutro Abs: 18.8 — ABNORMAL HIGH
Neutro Abs: 21.6 — ABNORMAL HIGH
Neutrophils Relative %: 76
Neutrophils Relative %: 84 — ABNORMAL HIGH
Neutrophils Relative %: 84 — ABNORMAL HIGH
Neutrophils Relative %: 86 — ABNORMAL HIGH

## 2011-01-18 LAB — URINALYSIS, ROUTINE W REFLEX MICROSCOPIC
Glucose, UA: NEGATIVE
Glucose, UA: NEGATIVE
Hgb urine dipstick: NEGATIVE
Hgb urine dipstick: NEGATIVE
Ketones, ur: 15 — AB
Ketones, ur: NEGATIVE
Nitrite: NEGATIVE
Nitrite: POSITIVE — AB
Protein, ur: 30 — AB
Protein, ur: NEGATIVE
Specific Gravity, Urine: 1.021
Specific Gravity, Urine: 1.023
Urobilinogen, UA: 2 — ABNORMAL HIGH
Urobilinogen, UA: 4 — ABNORMAL HIGH
pH: 5.5
pH: 5.5

## 2011-01-18 LAB — URIC ACID
Uric Acid, Serum: 7.3
Uric Acid, Serum: 3.3 — ABNORMAL LOW
Uric Acid, Serum: 3.4 — ABNORMAL LOW
Uric Acid, Serum: 3.6 — ABNORMAL LOW
Uric Acid, Serum: 4.5
Uric Acid, Serum: 5.1

## 2011-01-18 LAB — COMPREHENSIVE METABOLIC PANEL
ALT: 16
ALT: 19
ALT: 19
ALT: 21
ALT: 21
ALT: 24
ALT: 28
ALT: 29
AST: 34
AST: 36
AST: 57 — ABNORMAL HIGH
AST: 58 — ABNORMAL HIGH
AST: 61 — ABNORMAL HIGH
AST: 61 — ABNORMAL HIGH
AST: 64 — ABNORMAL HIGH
AST: 65 — ABNORMAL HIGH
Albumin: 1.9 — ABNORMAL LOW
Albumin: 1.9 — ABNORMAL LOW
Albumin: 2 — ABNORMAL LOW
Albumin: 2 — ABNORMAL LOW
Albumin: 2 — ABNORMAL LOW
Albumin: 2.1 — ABNORMAL LOW
Albumin: 2.1 — ABNORMAL LOW
Albumin: 2.2 — ABNORMAL LOW
Alkaline Phosphatase: 111
Alkaline Phosphatase: 123 — ABNORMAL HIGH
Alkaline Phosphatase: 126 — ABNORMAL HIGH
Alkaline Phosphatase: 82
Alkaline Phosphatase: 91
Alkaline Phosphatase: 92
Alkaline Phosphatase: 94
Alkaline Phosphatase: 98
BUN: 15
BUN: 15
BUN: 16
BUN: 17
BUN: 18
BUN: 21
BUN: 22
BUN: 8
CO2: 22
CO2: 25
CO2: 25
CO2: 27
CO2: 28
CO2: 29
CO2: 31
CO2: 31
Calcium: 8.1 — ABNORMAL LOW
Calcium: 8.1 — ABNORMAL LOW
Calcium: 8.2 — ABNORMAL LOW
Calcium: 8.3 — ABNORMAL LOW
Calcium: 8.3 — ABNORMAL LOW
Calcium: 8.7
Calcium: 8.8
Calcium: 9.3
Chloride: 104
Chloride: 104
Chloride: 104
Chloride: 105
Chloride: 106
Chloride: 106
Chloride: 107
Chloride: 111
Creatinine, Ser: 0.77
Creatinine, Ser: 0.78
Creatinine, Ser: 0.87
Creatinine, Ser: 0.89
Creatinine, Ser: 0.95
Creatinine, Ser: 1
Creatinine, Ser: 1
Creatinine, Ser: 1.07
GFR calc Af Amer: >60
GFR calc Af Amer: >60
GFR calc Af Amer: >60
GFR calc Af Amer: >60
GFR calc Af Amer: >60
GFR calc Af Amer: >60
GFR calc Af Amer: >60
GFR calc Af Amer: >60
GFR calc non Af Amer: >60
GFR calc non Af Amer: >60
GFR calc non Af Amer: >60
GFR calc non Af Amer: >60
GFR calc non Af Amer: >60
GFR calc non Af Amer: >60
GFR calc non Af Amer: >60
GFR calc non Af Amer: >60
Glucose, Bld: 108 — ABNORMAL HIGH
Glucose, Bld: 110 — ABNORMAL HIGH
Glucose, Bld: 114 — ABNORMAL HIGH
Glucose, Bld: 120 — ABNORMAL HIGH
Glucose, Bld: 123 — ABNORMAL HIGH
Glucose, Bld: 124 — ABNORMAL HIGH
Glucose, Bld: 138 — ABNORMAL HIGH
Glucose, Bld: 141 — ABNORMAL HIGH
Potassium: 3.1 — ABNORMAL LOW
Potassium: 3.3 — ABNORMAL LOW
Potassium: 3.5
Potassium: 3.5
Potassium: 3.6
Potassium: 3.8
Potassium: 4.2
Potassium: 4.2
Sodium: 138
Sodium: 141
Sodium: 142
Sodium: 143
Sodium: 144
Sodium: 144
Sodium: 146 — ABNORMAL HIGH
Sodium: 147 — ABNORMAL HIGH
Total Bilirubin: 1.4 — ABNORMAL HIGH
Total Bilirubin: 1.4 — ABNORMAL HIGH
Total Bilirubin: 1.6 — ABNORMAL HIGH
Total Bilirubin: 2.1 — ABNORMAL HIGH
Total Bilirubin: 2.2 — ABNORMAL HIGH
Total Bilirubin: 2.4 — ABNORMAL HIGH
Total Bilirubin: 2.5 — ABNORMAL HIGH
Total Bilirubin: 4.4 — ABNORMAL HIGH
Total Protein: 5.6 — ABNORMAL LOW
Total Protein: 5.6 — ABNORMAL LOW
Total Protein: 5.6 — ABNORMAL LOW
Total Protein: 5.6 — ABNORMAL LOW
Total Protein: 5.7 — ABNORMAL LOW
Total Protein: 6
Total Protein: 6.2
Total Protein: 6.3

## 2011-01-18 LAB — CULTURE, BLOOD (ROUTINE X 2)
Culture: NO GROWTH
Culture: NO GROWTH
Culture: NO GROWTH
Culture: NO GROWTH
Culture: NO GROWTH
Culture: NO GROWTH

## 2011-01-18 LAB — LEGIONELLA ANTIGEN, URINE: Legionella Antigen, Urine: NEGATIVE

## 2011-01-18 LAB — CARBOXYHEMOGLOBIN
Carboxyhemoglobin: 1.4
Carboxyhemoglobin: 1.6 — ABNORMAL HIGH
Methemoglobin: 1.3
Methemoglobin: 1.4
O2 Saturation: 70
O2 Saturation: 76
Total hemoglobin: 10.3 — ABNORMAL LOW
Total hemoglobin: 10.9 — ABNORMAL LOW

## 2011-01-18 LAB — CARDIAC PANEL(CRET KIN+CKTOT+MB+TROPI)
CK, MB: 0.5
CK, MB: 0.6
CK, MB: 0.7
CK, MB: 1
Relative Index: INVALID
Relative Index: INVALID
Relative Index: INVALID
Relative Index: INVALID
Total CK: 25
Total CK: 40
Total CK: 46
Total CK: 56
Troponin I: 0.01
Troponin I: 0.01
Troponin I: 0.02
Troponin I: 0.02

## 2011-01-18 LAB — PROTEIN, BODY FLUID: Total protein, fluid: 3

## 2011-01-18 LAB — BODY FLUID CELL COUNT WITH DIFFERENTIAL
Eos, Fluid: 0
Lymphs, Fluid: 93
Monocyte-Macrophage-Serous Fluid: 3 — ABNORMAL LOW
Neutrophil Count, Fluid: 4
Total Nucleated Cell Count, Fluid: 950

## 2011-01-18 LAB — PHOSPHORUS
Phosphorus: 2.3
Phosphorus: 3.2
Phosphorus: 3.3
Phosphorus: 4.6
Phosphorus: 4.9 — ABNORMAL HIGH

## 2011-01-18 LAB — PATHOLOGIST SMEAR REVIEW

## 2011-01-18 LAB — URINE MICROSCOPIC-ADD ON

## 2011-01-18 LAB — STREP PNEUMONIAE URINARY ANTIGEN: Strep Pneumo Urinary Antigen: NEGATIVE

## 2011-01-18 LAB — LACTATE DEHYDROGENASE, PLEURAL OR PERITONEAL FLUID: LD, Fluid: 412 — ABNORMAL HIGH

## 2011-01-18 LAB — SALICYLATE LEVEL: Salicylate Lvl: <4

## 2011-01-18 LAB — POCT I-STAT 3, ART BLOOD GAS (G3+)
Acid-base deficit: 5 — ABNORMAL HIGH
Bicarbonate: 18.1 — ABNORMAL LOW
O2 Saturation: 95
Operator id: 280991
TCO2: 19
pCO2 arterial: 27.7 — ABNORMAL LOW
pH, Arterial: 7.423
pO2, Arterial: 74 — ABNORMAL LOW

## 2011-01-18 LAB — SEDIMENTATION RATE
Sed Rate: 10
Sed Rate: 28 — ABNORMAL HIGH

## 2011-01-18 LAB — PROTIME-INR
INR: 0.9
INR: 1
Prothrombin Time: 12.7
Prothrombin Time: 13.1

## 2011-01-18 LAB — CROSSMATCH
ABO/RH(D): A POS
Antibody Screen: NEGATIVE

## 2011-01-18 LAB — CULTURE, BAL-QUANTITATIVE W GRAM STAIN
Colony Count: 2000
Colony Count: 30000
Colony Count: 50000

## 2011-01-18 LAB — TRIGLYCERIDES
Triglycerides: 229 — ABNORMAL HIGH
Triglycerides: 322 — ABNORMAL HIGH
Triglycerides: 436 — ABNORMAL HIGH

## 2011-01-18 LAB — GLUCOSE, SEROUS FLUID: Glucose, Fluid: 123

## 2011-01-18 LAB — APTT
aPTT: 24
aPTT: 26

## 2011-01-18 LAB — ABO/RH: ABO/RH(D): A POS

## 2011-01-18 LAB — HEMOGLOBIN A1C
Hgb A1c MFr Bld: 4.8
Mean Plasma Glucose: 94

## 2011-01-18 LAB — AMYLASE, BODY FLUID: Amylase, Fluid: 16

## 2011-01-18 LAB — T4, FREE: Free T4: 1.47

## 2011-01-18 LAB — P CARINII SMEAR DFA: Pneumocystis carinii DFA: NEGATIVE

## 2011-01-18 LAB — MAGNESIUM
Magnesium: 1.5
Magnesium: 1.8
Magnesium: 1.8
Magnesium: 1.9
Magnesium: 1.9

## 2011-01-18 LAB — VANCOMYCIN, TROUGH
Vancomycin Tr: 16.6
Vancomycin Tr: 19.5

## 2011-01-18 LAB — CK: Total CK: 79

## 2011-01-18 LAB — B-NATRIURETIC PEPTIDE (CONVERTED LAB): Pro B Natriuretic peptide (BNP): <30

## 2011-01-18 LAB — LIPASE, BLOOD: Lipase: 21

## 2011-01-18 LAB — LACTIC ACID, PLASMA
Lactic Acid, Venous: 4.1 — ABNORMAL HIGH
Lactic Acid, Venous: 4 — ABNORMAL HIGH
Lactic Acid, Venous: 4.6 — ABNORMAL HIGH

## 2011-01-18 LAB — ACETAMINOPHEN LEVEL: Acetaminophen (Tylenol), Serum: <10 — ABNORMAL LOW

## 2011-01-18 LAB — TSH: TSH: 4.345

## 2011-01-18 LAB — AMMONIA: Ammonia: 38 — ABNORMAL HIGH

## 2011-01-18 LAB — ETHANOL: Alcohol, Ethyl (B): <5

## 2011-01-19 LAB — DIFFERENTIAL
Basophils Absolute: 0
Basophils Absolute: 0
Basophils Absolute: 0
Basophils Absolute: 0.3 — ABNORMAL HIGH
Basophils Absolute: 0
Basophils Absolute: 0
Basophils Absolute: 0
Basophils Absolute: 0
Basophils Absolute: 0.1
Basophils Relative: 0
Basophils Relative: 0
Basophils Relative: 0
Basophils Relative: 2 — ABNORMAL HIGH
Basophils Relative: 0
Basophils Relative: 0
Basophils Relative: 0
Basophils Relative: 0
Basophils Relative: 1
Eosinophils Absolute: 0
Eosinophils Absolute: 0
Eosinophils Absolute: 0
Eosinophils Absolute: 0.1
Eosinophils Absolute: 0
Eosinophils Absolute: 0
Eosinophils Absolute: 0
Eosinophils Absolute: 0
Eosinophils Absolute: 0.1
Eosinophils Relative: 0
Eosinophils Relative: 0
Eosinophils Relative: 0
Eosinophils Relative: 0
Eosinophils Relative: 0
Eosinophils Relative: 0
Eosinophils Relative: 0
Eosinophils Relative: 0
Eosinophils Relative: 1
Lymphocytes Relative: 4 — ABNORMAL LOW
Lymphocytes Relative: 4 — ABNORMAL LOW
Lymphocytes Relative: 5 — ABNORMAL LOW
Lymphocytes Relative: 6 — ABNORMAL LOW
Lymphocytes Relative: 12
Lymphocytes Relative: 15
Lymphocytes Relative: 5 — ABNORMAL LOW
Lymphocytes Relative: 5 — ABNORMAL LOW
Lymphocytes Relative: 5 — ABNORMAL LOW
Lymphs Abs: 0.5 — ABNORMAL LOW
Lymphs Abs: 0.5 — ABNORMAL LOW
Lymphs Abs: 0.7
Lymphs Abs: 0.9
Lymphs Abs: 0.5 — ABNORMAL LOW
Lymphs Abs: 0.5 — ABNORMAL LOW
Lymphs Abs: 0.7
Lymphs Abs: 1
Lymphs Abs: 1.2
Monocytes Absolute: 0.7
Monocytes Absolute: 0.8
Monocytes Absolute: 1.3 — ABNORMAL HIGH
Monocytes Absolute: 1.5 — ABNORMAL HIGH
Monocytes Absolute: 0.1
Monocytes Absolute: 0.1
Monocytes Absolute: 0.2
Monocytes Absolute: 0.7
Monocytes Absolute: 0.9
Monocytes Relative: 10
Monocytes Relative: 6
Monocytes Relative: 7
Monocytes Relative: 8
Monocytes Relative: 1 — ABNORMAL LOW
Monocytes Relative: 1 — ABNORMAL LOW
Monocytes Relative: 2 — ABNORMAL LOW
Monocytes Relative: 5
Monocytes Relative: 8
Neutro Abs: 10.4 — ABNORMAL HIGH
Neutro Abs: 11 — ABNORMAL HIGH
Neutro Abs: 12.2 — ABNORMAL HIGH
Neutro Abs: 14.1 — ABNORMAL HIGH
Neutro Abs: 10 — ABNORMAL HIGH
Neutro Abs: 12.4 — ABNORMAL HIGH
Neutro Abs: 6.9
Neutro Abs: 7.5
Neutro Abs: 9.8 — ABNORMAL HIGH
Neutrophils Relative %: 83 — ABNORMAL HIGH
Neutrophils Relative %: 86 — ABNORMAL HIGH
Neutrophils Relative %: 88 — ABNORMAL HIGH
Neutrophils Relative %: 90 — ABNORMAL HIGH
Neutrophils Relative %: 84 — ABNORMAL HIGH
Neutrophils Relative %: 86 — ABNORMAL HIGH
Neutrophils Relative %: 87 — ABNORMAL HIGH
Neutrophils Relative %: 90 — ABNORMAL HIGH
Neutrophils Relative %: 93 — ABNORMAL HIGH

## 2011-01-19 LAB — BASIC METABOLIC PANEL
BUN: 14
BUN: 14
BUN: 6
BUN: 7
BUN: 8
BUN: 8
BUN: 8
BUN: 8
BUN: 9
BUN: 10
BUN: 11
BUN: 11
BUN: 12
BUN: 13
BUN: 15
BUN: 7
CO2: 23
CO2: 25
CO2: 25
CO2: 25
CO2: 26
CO2: 26
CO2: 26
CO2: 26
CO2: 26
CO2: 25
CO2: 25
CO2: 26
CO2: 26
CO2: 26
CO2: 28
CO2: 29
Calcium: 8.4
Calcium: 8.7
Calcium: 8.7
Calcium: 8.8
Calcium: 8.8
Calcium: 8.9
Calcium: 9
Calcium: 9.1
Calcium: 9.1
Calcium: 8.8
Calcium: 8.9
Calcium: 9
Calcium: 9
Calcium: 9.2
Calcium: 9.2
Calcium: 9.4
Chloride: 103
Chloride: 103
Chloride: 104
Chloride: 105
Chloride: 107
Chloride: 107
Chloride: 107
Chloride: 108
Chloride: 109
Chloride: 101
Chloride: 102
Chloride: 105
Chloride: 106
Chloride: 98
Chloride: 99
Chloride: 99
Creatinine, Ser: 0.6
Creatinine, Ser: 0.64
Creatinine, Ser: 0.64
Creatinine, Ser: 0.64
Creatinine, Ser: 0.66
Creatinine, Ser: 0.67
Creatinine, Ser: 0.7
Creatinine, Ser: 0.71
Creatinine, Ser: 0.89
Creatinine, Ser: 0.41
Creatinine, Ser: 0.45
Creatinine, Ser: 0.51
Creatinine, Ser: 0.52
Creatinine, Ser: 0.53
Creatinine, Ser: 0.55
Creatinine, Ser: 0.67
GFR calc Af Amer: >60
GFR calc Af Amer: >60
GFR calc Af Amer: >60
GFR calc Af Amer: >60
GFR calc Af Amer: >60
GFR calc Af Amer: >60
GFR calc Af Amer: >60
GFR calc Af Amer: >60
GFR calc Af Amer: >60
GFR calc Af Amer: >60
GFR calc Af Amer: >60
GFR calc Af Amer: >60
GFR calc Af Amer: >60
GFR calc Af Amer: >60
GFR calc Af Amer: >60
GFR calc Af Amer: >60
GFR calc non Af Amer: >60
GFR calc non Af Amer: >60
GFR calc non Af Amer: >60
GFR calc non Af Amer: >60
GFR calc non Af Amer: >60
GFR calc non Af Amer: >60
GFR calc non Af Amer: >60
GFR calc non Af Amer: >60
GFR calc non Af Amer: >60
GFR calc non Af Amer: >60
GFR calc non Af Amer: >60
GFR calc non Af Amer: >60
GFR calc non Af Amer: >60
GFR calc non Af Amer: >60
GFR calc non Af Amer: >60
GFR calc non Af Amer: >60
Glucose, Bld: 100 — ABNORMAL HIGH
Glucose, Bld: 102 — ABNORMAL HIGH
Glucose, Bld: 109 — ABNORMAL HIGH
Glucose, Bld: 117 — ABNORMAL HIGH
Glucose, Bld: 87
Glucose, Bld: 93
Glucose, Bld: 94
Glucose, Bld: 96
Glucose, Bld: 97
Glucose, Bld: 106 — ABNORMAL HIGH
Glucose, Bld: 123 — ABNORMAL HIGH
Glucose, Bld: 126 — ABNORMAL HIGH
Glucose, Bld: 128 — ABNORMAL HIGH
Glucose, Bld: 141 — ABNORMAL HIGH
Glucose, Bld: 95
Glucose, Bld: 97
Potassium: 3.4 — ABNORMAL LOW
Potassium: 3.4 — ABNORMAL LOW
Potassium: 3.6
Potassium: 3.7
Potassium: 3.7
Potassium: 3.7
Potassium: 3.8
Potassium: 4
Potassium: 4.1
Potassium: 3.6
Potassium: 3.8
Potassium: 3.8
Potassium: 3.9
Potassium: 4
Potassium: 4.4
Potassium: 4.4
Sodium: 137
Sodium: 138
Sodium: 139
Sodium: 139
Sodium: 140
Sodium: 140
Sodium: 141
Sodium: 141
Sodium: 141
Sodium: 133 — ABNORMAL LOW
Sodium: 135
Sodium: 135
Sodium: 136
Sodium: 137
Sodium: 138
Sodium: 142

## 2011-01-19 LAB — COMPREHENSIVE METABOLIC PANEL
ALT: 17
ALT: 22
ALT: 25
ALT: 32
ALT: 26
ALT: 35
ALT: 48
ALT: 60 — ABNORMAL HIGH
ALT: 68 — ABNORMAL HIGH
AST: 31
AST: 34
AST: 36
AST: 46 — ABNORMAL HIGH
AST: 47 — ABNORMAL HIGH
AST: 51 — ABNORMAL HIGH
AST: 53 — ABNORMAL HIGH
AST: 59 — ABNORMAL HIGH
AST: 61 — ABNORMAL HIGH
Albumin: 1.9 — ABNORMAL LOW
Albumin: 2.2 — ABNORMAL LOW
Albumin: 2.2 — ABNORMAL LOW
Albumin: 2.3 — ABNORMAL LOW
Albumin: 2.3 — ABNORMAL LOW
Albumin: 2.4 — ABNORMAL LOW
Albumin: 2.4 — ABNORMAL LOW
Albumin: 2.7 — ABNORMAL LOW
Albumin: 2.7 — ABNORMAL LOW
Alkaline Phosphatase: 62
Alkaline Phosphatase: 63
Alkaline Phosphatase: 67
Alkaline Phosphatase: 71
Alkaline Phosphatase: 84
Alkaline Phosphatase: 85
Alkaline Phosphatase: 85
Alkaline Phosphatase: 87
Alkaline Phosphatase: 94
BUN: 6
BUN: 8
BUN: 8
BUN: 9
BUN: 14
BUN: 16
BUN: 19
BUN: 19
BUN: 23
CO2: 24
CO2: 24
CO2: 26
CO2: 27
CO2: 24
CO2: 27
CO2: 28
CO2: 29
CO2: 32
Calcium: 8.3 — ABNORMAL LOW
Calcium: 8.5
Calcium: 8.8
Calcium: 8.9
Calcium: 8.9
Calcium: 9
Calcium: 9
Calcium: 9.1
Calcium: 9.2
Chloride: 105
Chloride: 106
Chloride: 107
Chloride: 108
Chloride: 100
Chloride: 101
Chloride: 103
Chloride: 105
Chloride: 97
Creatinine, Ser: 0.56
Creatinine, Ser: 0.64
Creatinine, Ser: 0.67
Creatinine, Ser: 0.68
Creatinine, Ser: 0.48
Creatinine, Ser: 0.54
Creatinine, Ser: 0.55
Creatinine, Ser: 0.62
Creatinine, Ser: 0.62
GFR calc Af Amer: >60
GFR calc Af Amer: >60
GFR calc Af Amer: >60
GFR calc Af Amer: >60
GFR calc Af Amer: >60
GFR calc Af Amer: >60
GFR calc Af Amer: >60
GFR calc Af Amer: >60
GFR calc Af Amer: >60
GFR calc non Af Amer: >60
GFR calc non Af Amer: >60
GFR calc non Af Amer: >60
GFR calc non Af Amer: >60
GFR calc non Af Amer: >60
GFR calc non Af Amer: >60
GFR calc non Af Amer: >60
GFR calc non Af Amer: >60
GFR calc non Af Amer: >60
Glucose, Bld: 107 — ABNORMAL HIGH
Glucose, Bld: 107 — ABNORMAL HIGH
Glucose, Bld: 107 — ABNORMAL HIGH
Glucose, Bld: 120 — ABNORMAL HIGH
Glucose, Bld: 108 — ABNORMAL HIGH
Glucose, Bld: 125 — ABNORMAL HIGH
Glucose, Bld: 127 — ABNORMAL HIGH
Glucose, Bld: 131 — ABNORMAL HIGH
Glucose, Bld: 136 — ABNORMAL HIGH
Potassium: 3.3 — ABNORMAL LOW
Potassium: 3.4 — ABNORMAL LOW
Potassium: 3.4 — ABNORMAL LOW
Potassium: 3.9
Potassium: 3.7
Potassium: 3.9
Potassium: 4.2
Potassium: 4.3
Potassium: 4.3
Sodium: 137
Sodium: 138
Sodium: 139
Sodium: 143
Sodium: 134 — ABNORMAL LOW
Sodium: 134 — ABNORMAL LOW
Sodium: 136
Sodium: 137
Sodium: 139
Total Bilirubin: 1.2
Total Bilirubin: 1.7 — ABNORMAL HIGH
Total Bilirubin: 2.1 — ABNORMAL HIGH
Total Bilirubin: 2.8 — ABNORMAL HIGH
Total Bilirubin: 1.3 — ABNORMAL HIGH
Total Bilirubin: 1.4 — ABNORMAL HIGH
Total Bilirubin: 1.5 — ABNORMAL HIGH
Total Bilirubin: 1.7 — ABNORMAL HIGH
Total Bilirubin: 1.9 — ABNORMAL HIGH
Total Protein: 5.5 — ABNORMAL LOW
Total Protein: 5.6 — ABNORMAL LOW
Total Protein: 5.6 — ABNORMAL LOW
Total Protein: 5.8 — ABNORMAL LOW
Total Protein: 5.7 — ABNORMAL LOW
Total Protein: 5.9 — ABNORMAL LOW
Total Protein: 6
Total Protein: 6.2
Total Protein: 6.2

## 2011-01-19 LAB — CBC
HCT: 26.7 — ABNORMAL LOW
HCT: 28.3 — ABNORMAL LOW
HCT: 28.3 — ABNORMAL LOW
HCT: 28.3 — ABNORMAL LOW
HCT: 28.6 — ABNORMAL LOW
HCT: 29 — ABNORMAL LOW
HCT: 29.8 — ABNORMAL LOW
HCT: 29.9 — ABNORMAL LOW
HCT: 30.7 — ABNORMAL LOW
HCT: 31.2 — ABNORMAL LOW
HCT: 32.1 — ABNORMAL LOW
HCT: 32.1 — ABNORMAL LOW
HCT: 25.8 — ABNORMAL LOW
HCT: 26 — ABNORMAL LOW
HCT: 26.4 — ABNORMAL LOW
HCT: 26.9 — ABNORMAL LOW
HCT: 27.2 — ABNORMAL LOW
HCT: 27.8 — ABNORMAL LOW
HCT: 28.4 — ABNORMAL LOW
HCT: 28.6 — ABNORMAL LOW
HCT: 29.7 — ABNORMAL LOW
HCT: 30.5 — ABNORMAL LOW
HCT: 31.1 — ABNORMAL LOW
HCT: 33.3 — ABNORMAL LOW
Hemoglobin: 10 — ABNORMAL LOW
Hemoglobin: 10 — ABNORMAL LOW
Hemoglobin: 10.5 — ABNORMAL LOW
Hemoglobin: 10.6 — ABNORMAL LOW
Hemoglobin: 10.8 — ABNORMAL LOW
Hemoglobin: 8.8 — ABNORMAL LOW
Hemoglobin: 9.5 — ABNORMAL LOW
Hemoglobin: 9.6 — ABNORMAL LOW
Hemoglobin: 9.6 — ABNORMAL LOW
Hemoglobin: 9.6 — ABNORMAL LOW
Hemoglobin: 9.7 — ABNORMAL LOW
Hemoglobin: 9.9 — ABNORMAL LOW
Hemoglobin: 10.3 — ABNORMAL LOW
Hemoglobin: 10.5 — ABNORMAL LOW
Hemoglobin: 8.5 — ABNORMAL LOW
Hemoglobin: 8.5 — ABNORMAL LOW
Hemoglobin: 8.6 — ABNORMAL LOW
Hemoglobin: 8.6 — ABNORMAL LOW
Hemoglobin: 8.9 — ABNORMAL LOW
Hemoglobin: 9 — ABNORMAL LOW
Hemoglobin: 9.1 — ABNORMAL LOW
Hemoglobin: 9.2 — ABNORMAL LOW
Hemoglobin: 9.6 — ABNORMAL LOW
Hemoglobin: 9.7 — ABNORMAL LOW
MCHC: 32.4
MCHC: 32.8
MCHC: 32.9
MCHC: 33
MCHC: 33.1
MCHC: 33.6
MCHC: 33.7
MCHC: 33.7
MCHC: 33.7
MCHC: 33.8
MCHC: 33.8
MCHC: 34.2
MCHC: 31
MCHC: 31.8
MCHC: 32
MCHC: 32
MCHC: 32.2
MCHC: 32.2
MCHC: 32.3
MCHC: 32.4
MCHC: 32.6
MCHC: 32.7
MCHC: 32.9
MCHC: 33.8
MCV: 73.5 — ABNORMAL LOW
MCV: 74 — ABNORMAL LOW
MCV: 74.1 — ABNORMAL LOW
MCV: 74.1 — ABNORMAL LOW
MCV: 74.2 — ABNORMAL LOW
MCV: 74.5 — ABNORMAL LOW
MCV: 74.6 — ABNORMAL LOW
MCV: 74.7 — ABNORMAL LOW
MCV: 74.8 — ABNORMAL LOW
MCV: 74.9 — ABNORMAL LOW
MCV: 74.9 — ABNORMAL LOW
MCV: 76.1 — ABNORMAL LOW
MCV: 74.2 — ABNORMAL LOW
MCV: 74.2 — ABNORMAL LOW
MCV: 74.4 — ABNORMAL LOW
MCV: 74.5 — ABNORMAL LOW
MCV: 74.6 — ABNORMAL LOW
MCV: 75 — ABNORMAL LOW
MCV: 75 — ABNORMAL LOW
MCV: 75.4 — ABNORMAL LOW
MCV: 75.4 — ABNORMAL LOW
MCV: 76 — ABNORMAL LOW
MCV: 76.1 — ABNORMAL LOW
MCV: 76.6 — ABNORMAL LOW
Platelets: 225
Platelets: 239
Platelets: 245
Platelets: 251
Platelets: 259
Platelets: 274
Platelets: 283
Platelets: 284
Platelets: 285
Platelets: 291
Platelets: 292
Platelets: 313
Platelets: 176
Platelets: 188
Platelets: 197
Platelets: 224
Platelets: 230
Platelets: 240
Platelets: 252
Platelets: 262
Platelets: 262
Platelets: 272
Platelets: 294
Platelets: 307
RBC: 3.56 — ABNORMAL LOW
RBC: 3.79 — ABNORMAL LOW
RBC: 3.82 — ABNORMAL LOW
RBC: 3.85 — ABNORMAL LOW
RBC: 3.86 — ABNORMAL LOW
RBC: 3.87 — ABNORMAL LOW
RBC: 3.99 — ABNORMAL LOW
RBC: 4.02 — ABNORMAL LOW
RBC: 4.04 — ABNORMAL LOW
RBC: 4.18 — ABNORMAL LOW
RBC: 4.3
RBC: 4.34
RBC: 3.48 — ABNORMAL LOW
RBC: 3.5 — ABNORMAL LOW
RBC: 3.54 — ABNORMAL LOW
RBC: 3.57 — ABNORMAL LOW
RBC: 3.66 — ABNORMAL LOW
RBC: 3.72 — ABNORMAL LOW
RBC: 3.77 — ABNORMAL LOW
RBC: 3.77 — ABNORMAL LOW
RBC: 3.96 — ABNORMAL LOW
RBC: 4.07 — ABNORMAL LOW
RBC: 4.09 — ABNORMAL LOW
RBC: 4.35
RDW: 18.4 — ABNORMAL HIGH
RDW: 18.5 — ABNORMAL HIGH
RDW: 18.6 — ABNORMAL HIGH
RDW: 18.6 — ABNORMAL HIGH
RDW: 18.7 — ABNORMAL HIGH
RDW: 18.8 — ABNORMAL HIGH
RDW: 19 — ABNORMAL HIGH
RDW: 19.2 — ABNORMAL HIGH
RDW: 19.3 — ABNORMAL HIGH
RDW: 19.3 — ABNORMAL HIGH
RDW: 19.9 — ABNORMAL HIGH
RDW: 20.2 — ABNORMAL HIGH
RDW: 19.1 — ABNORMAL HIGH
RDW: 19.1 — ABNORMAL HIGH
RDW: 19.2 — ABNORMAL HIGH
RDW: 19.3 — ABNORMAL HIGH
RDW: 19.3 — ABNORMAL HIGH
RDW: 19.3 — ABNORMAL HIGH
RDW: 19.3 — ABNORMAL HIGH
RDW: 19.4 — ABNORMAL HIGH
RDW: 19.7 — ABNORMAL HIGH
RDW: 20.1 — ABNORMAL HIGH
RDW: 20.2 — ABNORMAL HIGH
RDW: 20.3 — ABNORMAL HIGH
WBC: 10.3
WBC: 10.9 — ABNORMAL HIGH
WBC: 11.3 — ABNORMAL HIGH
WBC: 11.7 — ABNORMAL HIGH
WBC: 11.8 — ABNORMAL HIGH
WBC: 12.3 — ABNORMAL HIGH
WBC: 13 — ABNORMAL HIGH
WBC: 13.6 — ABNORMAL HIGH
WBC: 14.7 — ABNORMAL HIGH
WBC: 16.2 — ABNORMAL HIGH
WBC: 16.4 — ABNORMAL HIGH
WBC: 19.4 — ABNORMAL HIGH
WBC: 10.1
WBC: 10.6 — ABNORMAL HIGH
WBC: 11.1 — ABNORMAL HIGH
WBC: 11.5 — ABNORMAL HIGH
WBC: 13.8 — ABNORMAL HIGH
WBC: 14.2 — ABNORMAL HIGH
WBC: 17.1 — ABNORMAL HIGH
WBC: 19.7 — ABNORMAL HIGH
WBC: 21.3 — ABNORMAL HIGH
WBC: 8.2
WBC: 8.7
WBC: 9.5

## 2011-01-19 LAB — LACTATE DEHYDROGENASE
LDH: 292 — ABNORMAL HIGH
LDH: 316 — ABNORMAL HIGH
LDH: 406 — ABNORMAL HIGH
LDH: 431 — ABNORMAL HIGH
LDH: 471 — ABNORMAL HIGH
LDH: 488 — ABNORMAL HIGH
LDH: 169
LDH: 337 — ABNORMAL HIGH
LDH: 484 — ABNORMAL HIGH
LDH: 565 — ABNORMAL HIGH
LDH: 593 — ABNORMAL HIGH
LDH: 601 — ABNORMAL HIGH

## 2011-01-19 LAB — CULTURE, BLOOD (ROUTINE X 2)
Culture: NO GROWTH
Culture: NO GROWTH

## 2011-01-19 LAB — IRON AND TIBC
Iron: <10 — ABNORMAL LOW
UIBC: 217

## 2011-01-19 LAB — CROSSMATCH
ABO/RH(D): A POS
Antibody Screen: NEGATIVE

## 2011-01-19 LAB — URINE CULTURE
Colony Count: NO GROWTH
Colony Count: 75000
Colony Count: >=100000
Culture: NO GROWTH
Special Requests: NEGATIVE
Special Requests: NEGATIVE
Special Requests: NEGATIVE

## 2011-01-19 LAB — URINALYSIS, ROUTINE W REFLEX MICROSCOPIC
Bilirubin Urine: NEGATIVE
Bilirubin Urine: NEGATIVE
Bilirubin Urine: NEGATIVE
Glucose, UA: NEGATIVE
Glucose, UA: NEGATIVE
Glucose, UA: NEGATIVE
Hgb urine dipstick: NEGATIVE
Ketones, ur: NEGATIVE
Ketones, ur: NEGATIVE
Ketones, ur: NEGATIVE
Nitrite: NEGATIVE
Nitrite: NEGATIVE
Nitrite: POSITIVE — AB
Protein, ur: NEGATIVE
Protein, ur: 30 — AB
Protein, ur: NEGATIVE
Specific Gravity, Urine: 1.018
Specific Gravity, Urine: 1.018
Specific Gravity, Urine: 1.023
Urobilinogen, UA: 4 — ABNORMAL HIGH
Urobilinogen, UA: 1
Urobilinogen, UA: 1
pH: 6
pH: 8.5 — ABNORMAL HIGH
pH: 8.5 — ABNORMAL HIGH

## 2011-01-19 LAB — APTT
aPTT: 31
aPTT: 25

## 2011-01-19 LAB — CULTURE, RESPIRATORY W GRAM STAIN

## 2011-01-19 LAB — PROTIME-INR
INR: 1
INR: 0.9
Prothrombin Time: 13.4
Prothrombin Time: 12.4

## 2011-01-19 LAB — VITAMIN B12: Vitamin B-12: 863 (ref 211–911)

## 2011-01-19 LAB — TRIGLYCERIDES
Triglycerides: 118
Triglycerides: 167 — ABNORMAL HIGH
Triglycerides: 179 — ABNORMAL HIGH
Triglycerides: 216 — ABNORMAL HIGH
Triglycerides: 128
Triglycerides: 140
Triglycerides: 144
Triglycerides: 146
Triglycerides: 153 — ABNORMAL HIGH
Triglycerides: 170 — ABNORMAL HIGH

## 2011-01-19 LAB — URINE MICROSCOPIC-ADD ON

## 2011-01-19 LAB — URIC ACID
Uric Acid, Serum: 4.8
Uric Acid, Serum: 5.3
Uric Acid, Serum: 4.1
Uric Acid, Serum: 6.3
Uric Acid, Serum: 7.7

## 2011-01-19 LAB — MAGNESIUM
Magnesium: 1.8
Magnesium: 1.8

## 2011-01-19 LAB — RETICULOCYTES
RBC.: 4.28
Retic Count, Absolute: 72.8
Retic Ct Pct: 1.7

## 2011-01-19 LAB — CLOZAPINE (CLOZARIL): Clozapine Lvl: 1296 ng/mL (ref 200–700)

## 2011-01-19 LAB — FERRITIN: Ferritin: 492 — ABNORMAL HIGH (ref 22–322)

## 2011-01-19 LAB — FOLATE: Folate: 12

## 2011-01-19 LAB — PHOSPHORUS: Phosphorus: 3.4

## 2011-01-26 LAB — URINALYSIS, ROUTINE W REFLEX MICROSCOPIC
Glucose, UA: NEGATIVE
Hgb urine dipstick: NEGATIVE
Leukocytes, UA: NEGATIVE
Nitrite: POSITIVE — AB
Protein, ur: NEGATIVE
Specific Gravity, Urine: 1.027
Urobilinogen, UA: 1
pH: 6

## 2011-01-26 LAB — URINE MICROSCOPIC-ADD ON

## 2011-01-26 LAB — DIFFERENTIAL
Basophils Absolute: 0
Basophils Relative: 1
Eosinophils Absolute: 0
Eosinophils Relative: 1
Lymphocytes Relative: 23
Lymphs Abs: 0.6 — ABNORMAL LOW
Monocytes Absolute: 0.5
Monocytes Relative: 19 — ABNORMAL HIGH
Neutro Abs: 1.6 — ABNORMAL LOW
Neutrophils Relative %: 56

## 2011-01-26 LAB — CBC
HCT: 40.7
Hemoglobin: 13.1
MCHC: 32.2
MCV: 87.4
Platelets: 128 — ABNORMAL LOW
RBC: 4.66
RDW: 14.5
WBC: 2.7 — ABNORMAL LOW

## 2011-01-26 LAB — URINE CULTURE: Colony Count: 1000

## 2011-01-26 LAB — BASIC METABOLIC PANEL
BUN: 8
CO2: 26
Calcium: 8.9
Chloride: 107
Creatinine, Ser: 0.7
GFR calc Af Amer: >60
GFR calc non Af Amer: >60
Glucose, Bld: 106 — ABNORMAL HIGH
Potassium: 3.1 — ABNORMAL LOW
Sodium: 139

## 2011-01-26 LAB — GLUCOSE, CAPILLARY: Glucose-Capillary: 111 — ABNORMAL HIGH

## 2011-05-18 ENCOUNTER — Telehealth: Payer: Self-pay | Admitting: Internal Medicine

## 2011-05-18 NOTE — Telephone Encounter (Signed)
called to get 2/13 appts,due to her work sch she needs appts to be on diff. days,lab 2/19 ct on 2/20 and md on 2/26.  aware to p/u drink on 2/19   am

## 2011-06-15 ENCOUNTER — Other Ambulatory Visit: Payer: Self-pay | Admitting: Physician Assistant

## 2011-06-15 ENCOUNTER — Other Ambulatory Visit: Payer: Self-pay | Admitting: *Deleted

## 2011-06-15 ENCOUNTER — Other Ambulatory Visit (HOSPITAL_BASED_OUTPATIENT_CLINIC_OR_DEPARTMENT_OTHER): Payer: Medicare Other | Admitting: Lab

## 2011-06-15 DIAGNOSIS — C8589 Other specified types of non-Hodgkin lymphoma, extranodal and solid organ sites: Secondary | ICD-10-CM | POA: Diagnosis not present

## 2011-06-15 LAB — CBC WITH DIFFERENTIAL/PLATELET
BASO%: 0.8 % (ref 0.0–2.0)
Basophils Absolute: 0 10*3/uL (ref 0.0–0.1)
EOS%: 0.9 % (ref 0.0–7.0)
Eosinophils Absolute: 0 10*3/uL (ref 0.0–0.5)
HCT: 43.3 % (ref 38.4–49.9)
HGB: 14.4 g/dL (ref 13.0–17.1)
LYMPH%: 24.2 % (ref 14.0–49.0)
MCH: 28.8 pg (ref 27.2–33.4)
MCHC: 33.3 g/dL (ref 32.0–36.0)
MCV: 86.5 fL (ref 79.3–98.0)
MONO#: 0.5 10*3/uL (ref 0.1–0.9)
MONO%: 14.5 % — ABNORMAL HIGH (ref 0.0–14.0)
NEUT#: 2.2 10*3/uL (ref 1.5–6.5)
NEUT%: 59.6 % (ref 39.0–75.0)
Platelets: 101 10*3/uL — ABNORMAL LOW (ref 140–400)
RBC: 5 10*6/uL (ref 4.20–5.82)
RDW: 13.1 % (ref 11.0–14.6)
WBC: 3.7 10*3/uL — ABNORMAL LOW (ref 4.0–10.3)
lymph#: 0.9 10*3/uL (ref 0.9–3.3)

## 2011-06-15 LAB — COMPREHENSIVE METABOLIC PANEL
ALT: 45 U/L (ref 0–53)
AST: 79 U/L — ABNORMAL HIGH (ref 0–37)
Albumin: 4.1 g/dL (ref 3.5–5.2)
Alkaline Phosphatase: 90 U/L (ref 39–117)
BUN: 10 mg/dL (ref 6–23)
CO2: 25 mEq/L (ref 19–32)
Calcium: 9.4 mg/dL (ref 8.4–10.5)
Chloride: 107 mEq/L (ref 96–112)
Creatinine, Ser: 0.96 mg/dL (ref 0.50–1.35)
Glucose, Bld: 105 mg/dL — ABNORMAL HIGH (ref 70–99)
Potassium: 4.2 mEq/L (ref 3.5–5.3)
Sodium: 143 mEq/L (ref 135–145)
Total Bilirubin: 2.6 mg/dL — ABNORMAL HIGH (ref 0.3–1.2)
Total Protein: 7.2 g/dL (ref 6.0–8.3)

## 2011-06-15 LAB — LACTATE DEHYDROGENASE: LDH: 156 U/L (ref 94–250)

## 2011-06-16 ENCOUNTER — Ambulatory Visit (HOSPITAL_COMMUNITY)
Admission: RE | Admit: 2011-06-16 | Discharge: 2011-06-16 | Disposition: A | Discharge status: Home / Self Care | Payer: Medicare Other | Source: Ambulatory Visit | Attending: Internal Medicine | Admitting: Internal Medicine

## 2011-06-16 DIAGNOSIS — R220 Localized swelling, mass and lump, head: Secondary | ICD-10-CM | POA: Insufficient documentation

## 2011-06-16 DIAGNOSIS — R221 Localized swelling, mass and lump, neck: Secondary | ICD-10-CM | POA: Insufficient documentation

## 2011-06-16 DIAGNOSIS — Z9221 Personal history of antineoplastic chemotherapy: Secondary | ICD-10-CM | POA: Insufficient documentation

## 2011-06-16 DIAGNOSIS — K449 Diaphragmatic hernia without obstruction or gangrene: Secondary | ICD-10-CM | POA: Diagnosis not present

## 2011-06-16 DIAGNOSIS — C8589 Other specified types of non-Hodgkin lymphoma, extranodal and solid organ sites: Secondary | ICD-10-CM | POA: Insufficient documentation

## 2011-06-16 DIAGNOSIS — Z09 Encounter for follow-up examination after completed treatment for conditions other than malignant neoplasm: Secondary | ICD-10-CM | POA: Diagnosis not present

## 2011-06-16 MED ORDER — IOHEXOL 300 MG/ML  SOLN
100.0000 mL | Freq: Once | INTRAMUSCULAR | Status: AC | PRN
  Administered 2011-06-16: 100 mL via INTRAVENOUS

## 2011-06-17 ENCOUNTER — Other Ambulatory Visit (HOSPITAL_COMMUNITY): Payer: Medicare Other

## 2011-06-17 DIAGNOSIS — F200 Paranoid schizophrenia: Secondary | ICD-10-CM | POA: Diagnosis not present

## 2011-06-21 DIAGNOSIS — F200 Paranoid schizophrenia: Secondary | ICD-10-CM | POA: Diagnosis not present

## 2011-06-22 ENCOUNTER — Ambulatory Visit (HOSPITAL_BASED_OUTPATIENT_CLINIC_OR_DEPARTMENT_OTHER): Payer: Medicare Other | Admitting: Internal Medicine

## 2011-06-22 ENCOUNTER — Telehealth: Payer: Self-pay | Admitting: Internal Medicine

## 2011-06-22 VITALS — BP 137/83 | HR 83 | Temp 97.8°F | Wt 221.5 lb

## 2011-06-22 DIAGNOSIS — C8589 Other specified types of non-Hodgkin lymphoma, extranodal and solid organ sites: Secondary | ICD-10-CM

## 2011-06-22 NOTE — Telephone Encounter (Signed)
Gv pt appt for aug2013.  scheduled ct scan for 08/26 @ [facility]

## 2011-06-22 NOTE — Progress Notes (Signed)
[facility] [phone]   Fax:(336) [phone]  OFFICE PROGRESS NOTE  PRIBULA,CHRISTOPHER, MD, MD [address] [place] [address]  PRINCIPAL DIAGNOSIS:  Stage IV non-Hodgkin lymphoma diagnosed in March 2009.  PRIOR THERAPY:   1. Status post 4 weekly doses of Rituxan during the patient's hospitalization at the intensive care unit at [facility] with respiratory failure. 2. Status post 7 cycles of systemic chemotherapy with CHOP/Rituxan.  Last dose was given January 15, 2008.  CURRENT THERAPY:  Observation.  INTERVAL HISTORY: Clifford Benninger 51 y.o. male returns to the clinic today for routine six-month followup visit. The patient has no complaints today and feeling fine. He denied having any significant weight loss or night sweats or lymphadenopathy. He has no nausea or vomiting. He has no chest pain or shortness of breath. The patient has repeat CT scan of the neck, chest, abdomen and pelvis performed recently and he is here today for evaluation and discussion of his scan results  MEDICAL HISTORY: Past Medical History  Diagnosis Date  . Hepatitis C     not treatable  . Hypertension   . Schizophrenia   . Hemorrhoids   . Rectal bleeding   . Abdominal pain   . Non Hodgkin's lymphoma   . Cancer     lymphoma    ALLERGIES:   has no known allergies.  MEDICATIONS:  Current Outpatient Prescriptions  Medication Sig Dispense Refill  . OLANZapine (ZYPREXA) 20 MG tablet Take 20 mg by mouth at bedtime.          REVIEW OF SYSTEMS:  A comprehensive review of systems was negative.   PHYSICAL EXAMINATION: General appearance: alert, cooperative, appears stated age and no distress Neck: no adenopathy Lymph nodes: Cervical, supraclavicular, and axillary nodes normal. Resp: clear to auscultation bilaterally Cardio: regular rate and rhythm, S1, S2 normal, no murmur, click, rub or gallop GI: soft, non-tender; bowel sounds normal; no masses,   no organomegaly Extremities: extremities normal, atraumatic, no cyanosis or edema Neurologic: Alert and oriented X 3, normal strength and tone. Normal symmetric reflexes. Normal coordination and gait  ECOG PERFORMANCE STATUS: 0 - Asymptomatic  Blood pressure 137/83, pulse 83, temperature 97.8 F (36.6 C), temperature source Oral, weight 221 lb 8 oz (100.472 kg).  LABORATORY DATA: Lab Results  Component Value Date   WBC 3.7* 06/15/2011   HGB 14.4 06/15/2011   HCT 43.3 06/15/2011   MCV 86.5 06/15/2011   PLT 101* 06/15/2011      Chemistry      Component Value Date/Time   NA 143 06/15/2011 0956   NA 139 12/18/2010 1219   K 4.2 06/15/2011 0956   K 4.5 12/18/2010 1219   CL 107 06/15/2011 0956   CL 103 12/18/2010 1219   CO2 25 06/15/2011 0956   CO2 26 12/18/2010 1219   BUN 10 06/15/2011 0956   BUN 9 12/18/2010 1219   CREATININE 0.96 06/15/2011 0956   CREATININE 1.2 12/18/2010 1219      Component Value Date/Time   CALCIUM 9.4 06/15/2011 0956   CALCIUM 8.8 12/18/2010 1219   ALKPHOS 90 06/15/2011 0956   ALKPHOS 104* 12/18/2010 1219   AST 79* 06/15/2011 0956   AST 145* 12/18/2010 1219   ALT 45 06/15/2011 0956   BILITOT 2.6* 06/15/2011 0956   BILITOT 2.40* 12/18/2010 1219       RADIOGRAPHIC STUDIES: Ct Soft Tissue Neck W Contrast  06/16/2011  *RADIOLOGY REPORT*  Clinical Data: Lymphoma follow-up.  CT of the chest, abdomen and pelvis performed at the same time dictated separately.  CT NECK WITH CONTRAST  Technique:  Multidetector CT imaging of the neck was performed with intravenous contrast.  Contrast: OMNIPAQUE IOHEXOL 300 MG/ML IV SOLN  Comparison: 12/18/2010.  Findings: The inferior right parotid lesion probably representing a lymph node measuring 1.5 x 0.8 x 2 cm is without change.  Scattered normal sized lymph nodes otherwise noted without evidence of adenopathy.  No primary neck mass identified.  Visualized intracranial structures and orbital structures unremarkable.  Mild cervical  spondylotic changes.  IMPRESSION: The inferior right parotid lesion probably representing a lymph node measuring 1.5 x 0.8 x 2 cm is without change.  Scattered normal sized lymph nodes otherwise noted without evidence of adenopathy.  No primary neck mass identified.  Original Report Authenticated By: Fuller Canada, M.D.   Ct Chest W Contrast  06/16/2011  *RADIOLOGY REPORT*  Clinical Data:  Non-Hodgkins lymphoma.  Chemotherapy completed in 2005.  CT CHEST, ABDOMEN AND PELVIS WITH CONTRAST  Technique:  Multidetector CT imaging of the chest, abdomen and pelvis was performed following the standard protocol during bolus administration of intravenous contrast.  Contrast: OMNIPAQUE IOHEXOL 300 MG/ML IV SOLN  Comparison:  Prior examinations 12/18/2010 and 06/22/2010.  CT CHEST  Findings:  Minimal thyroid heterogeneity appears stable.  There are no enlarged mediastinal, hilar or axillary lymph nodes.  The heart and great vessels appear normal.  There is a small hiatal hernia.  No pleural or pericardial effusion is present.  Minimal emphysematous changes and left lower lobe scarring are stable. There are no suspicious osseous findings.  IMPRESSION: Stable chest CT.  No evidence of recurrent adenopathy.  CT ABDOMEN AND PELVIS  Findings:  There is stable contour irregularity of the liver consistent with cirrhosis.  No focal hepatic abnormalities are identified.  Mild splenomegaly appears unchanged.  The gallbladder, biliary system and pancreas appear unremarkable.  There is no adrenal mass.  The kidneys appear stable without suspicious findings or hydronephrosis.  A small amount of nodal tissue at the gastrohepatic ligament on images 55 - 57 is unchanged.  Small lymph nodes in the porta hepatis are stable.  Left inguinal node measuring 2.7 x 2.1 cm on image 123 is unchanged.  There is no ascites or peritoneal nodularity.  There are no suspicious osseous findings.  A small umbilical hernia containing only fat is  unchanged.  IMPRESSION:  1.  Stable mildly prominent lymph nodes in the upper abdomen and the left groin.  No progressive adenopathy demonstrated. 2.  Stable mild splenomegaly.  This may be related to suspected cirrhosis and portal hypertension. 3.  No acute findings identified.  Original Report Authenticated By: Gerrianne Scale, M.D.   ASSESSMENT: This is a very pleasant 51 years old [place] American male with history of stage IV non-Hodgkin's lymphoma status post systemic chemotherapy with CHOP/Rituxan. The patient has been observation since September of 2009 with no evidence for disease recurrence.  PLAN:  I recommended for the patient continuous observation for now with repeat CT scan of the chest, abdomen and pelvis in 6 months. The patient was advised to call me immediately if he has any concerning symptoms in the interval.   All questions were answered. The patient knows to call the clinic with any problems, questions or concerns. We can certainly see the patient much sooner if necessary.

## 2011-09-13 DIAGNOSIS — R7401 Elevation of levels of liver transaminase levels: Secondary | ICD-10-CM | POA: Diagnosis not present

## 2011-09-13 DIAGNOSIS — Z1211 Encounter for screening for malignant neoplasm of colon: Secondary | ICD-10-CM | POA: Diagnosis not present

## 2011-09-13 DIAGNOSIS — R7402 Elevation of levels of lactic acid dehydrogenase (LDH): Secondary | ICD-10-CM | POA: Diagnosis not present

## 2011-10-11 DIAGNOSIS — F200 Paranoid schizophrenia: Secondary | ICD-10-CM | POA: Diagnosis not present

## 2011-12-20 ENCOUNTER — Other Ambulatory Visit (HOSPITAL_BASED_OUTPATIENT_CLINIC_OR_DEPARTMENT_OTHER): Payer: Medicare Other | Admitting: Lab

## 2011-12-20 ENCOUNTER — Ambulatory Visit (HOSPITAL_COMMUNITY)
Admission: RE | Admit: 2011-12-20 | Discharge: 2011-12-20 | Disposition: A | Discharge status: Home / Self Care | Payer: Medicare Other | Source: Ambulatory Visit | Attending: Internal Medicine | Admitting: Internal Medicine

## 2011-12-20 ENCOUNTER — Other Ambulatory Visit (HOSPITAL_COMMUNITY): Payer: Medicare Other

## 2011-12-20 DIAGNOSIS — R161 Splenomegaly, not elsewhere classified: Secondary | ICD-10-CM | POA: Diagnosis not present

## 2011-12-20 DIAGNOSIS — C8583 Other specified types of non-Hodgkin lymphoma, intra-abdominal lymph nodes: Secondary | ICD-10-CM | POA: Diagnosis not present

## 2011-12-20 DIAGNOSIS — C8589 Other specified types of non-Hodgkin lymphoma, extranodal and solid organ sites: Secondary | ICD-10-CM | POA: Insufficient documentation

## 2011-12-20 DIAGNOSIS — D709 Neutropenia, unspecified: Secondary | ICD-10-CM | POA: Diagnosis not present

## 2011-12-20 DIAGNOSIS — C8582 Other specified types of non-Hodgkin lymphoma, intrathoracic lymph nodes: Secondary | ICD-10-CM | POA: Diagnosis not present

## 2011-12-20 LAB — CBC WITH DIFFERENTIAL/PLATELET
BASO%: 0.7 % (ref 0.0–2.0)
Basophils Absolute: 0 10*3/uL (ref 0.0–0.1)
EOS%: 0.7 % (ref 0.0–7.0)
Eosinophils Absolute: 0 10*3/uL (ref 0.0–0.5)
HCT: 44.4 % (ref 38.4–49.9)
HGB: 14.8 g/dL (ref 13.0–17.1)
LYMPH%: 23.4 % (ref 14.0–49.0)
MCH: 28.7 pg (ref 27.2–33.4)
MCHC: 33.3 g/dL (ref 32.0–36.0)
MCV: 86.1 fL (ref 79.3–98.0)
MONO#: 0.5 10*3/uL (ref 0.1–0.9)
MONO%: 11.9 % (ref 0.0–14.0)
NEUT#: 2.4 10*3/uL (ref 1.5–6.5)
NEUT%: 63.3 % (ref 39.0–75.0)
Platelets: 91 10*3/uL — ABNORMAL LOW (ref 140–400)
RBC: 5.16 10*6/uL (ref 4.20–5.82)
RDW: 13 % (ref 11.0–14.6)
WBC: 3.9 10*3/uL — ABNORMAL LOW (ref 4.0–10.3)
lymph#: 0.9 10*3/uL (ref 0.9–3.3)

## 2011-12-20 LAB — COMPREHENSIVE METABOLIC PANEL (CC13)
ALT: 51 U/L (ref 0–55)
AST: 73 U/L — ABNORMAL HIGH (ref 5–34)
Albumin: 3.7 g/dL (ref 3.5–5.0)
Alkaline Phosphatase: 102 U/L (ref 40–150)
BUN: 9 mg/dL (ref 7.0–26.0)
CO2: 25 mEq/L (ref 22–29)
Calcium: 9.4 mg/dL (ref 8.4–10.4)
Chloride: 107 mEq/L (ref 98–107)
Creatinine: 0.9 mg/dL (ref 0.7–1.3)
Glucose: 100 mg/dl — ABNORMAL HIGH (ref 70–99)
Potassium: 4.3 mEq/L (ref 3.5–5.1)
Sodium: 140 mEq/L (ref 136–145)
Total Bilirubin: 2.2 mg/dL — ABNORMAL HIGH (ref 0.20–1.20)
Total Protein: 8.2 g/dL (ref 6.4–8.3)

## 2011-12-20 LAB — LACTATE DEHYDROGENASE (CC13): LDH: 192 U/L (ref 125–220)

## 2011-12-20 MED ORDER — IOHEXOL 300 MG/ML  SOLN
100.0000 mL | Freq: Once | INTRAMUSCULAR | Status: AC | PRN
  Administered 2011-12-20: 100 mL via INTRAVENOUS

## 2011-12-22 ENCOUNTER — Telehealth: Payer: Self-pay | Admitting: Internal Medicine

## 2011-12-22 ENCOUNTER — Ambulatory Visit (HOSPITAL_BASED_OUTPATIENT_CLINIC_OR_DEPARTMENT_OTHER): Payer: Medicare Other | Admitting: Internal Medicine

## 2011-12-22 VITALS — BP 147/86 | HR 73 | Temp 97.4°F | Resp 20 | Ht 68.0 in | Wt 213.8 lb

## 2011-12-22 DIAGNOSIS — C8589 Other specified types of non-Hodgkin lymphoma, extranodal and solid organ sites: Secondary | ICD-10-CM

## 2011-12-22 NOTE — Telephone Encounter (Signed)
Gave pt appt for February 2014 lab and CT, gave pt oral contrast and NPO 4 hours prior to CT, Pt will see MD in a few days

## 2011-12-22 NOTE — Progress Notes (Signed)
[facility] Telephone:(336) [phone]   Fax:(336) [phone]  OFFICE PROGRESS NOTE  PRINCIPAL DIAGNOSIS: Stage IV non-Hodgkin lymphoma diagnosed in March 2009.   PRIOR THERAPY:  1. Status post 4 weekly doses of Rituxan during the patient's hospitalization at the intensive care unit at [facility] with respiratory failure. 2. Status post 7 cycles of systemic chemotherapy with CHOP/Rituxan. Last dose was given January 15, 2008.  CURRENT THERAPY: Observation.   INTERVAL HISTORY: [name] 51 y.o. male returns to the clinic today for six-month followup visit accompanied by his sister. The patient is feeling fine today with no specific complaints. He denied having any significant weight loss or night sweats. He has no palpable lymphadenopathy. The patient denied having any significant chest pain, shortness breath, cough or hemoptysis. He has repeat CT scan of the chest, abdomen and pelvis performed recently and he is here today for evaluation and discussion of his scan results.  MEDICAL HISTORY: Past Medical History  Diagnosis Date  . Hepatitis C     not treatable  . Hypertension   . Schizophrenia   . Hemorrhoids   . Rectal bleeding   . Abdominal pain   . Non Hodgkin's lymphoma   . Cancer     lymphoma    ALLERGIES:   has no known allergies.  MEDICATIONS:  Current Outpatient Prescriptions  Medication Sig Dispense Refill  . OLANZapine (ZYPREXA) 20 MG tablet Take 20 mg by mouth at bedtime.          REVIEW OF SYSTEMS:  A comprehensive review of systems was negative.   PHYSICAL EXAMINATION: General appearance: alert, cooperative and no distress Head: Normocephalic, without obvious abnormality, atraumatic Neck: no adenopathy Lymph nodes: Cervical, supraclavicular, and axillary nodes normal. Resp: clear to auscultation bilaterally Cardio: regular rate and rhythm, S1, S2 normal, no murmur, click, rub or gallop GI: soft, non-tender; bowel sounds normal;  no masses,  no organomegaly Extremities: extremities normal, atraumatic, no cyanosis or edema  ECOG PERFORMANCE STATUS: 0 - Asymptomatic  Blood pressure 147/86, pulse 73, temperature 97.4 F (36.3 C), temperature source Oral, resp. rate 20, height 5\' 8"  (1.727 m), weight 213 lb 12.8 oz (96.979 kg).  LABORATORY DATA: Lab Results  Component Value Date   WBC 3.9* 12/20/2011   HGB 14.8 12/20/2011   HCT 44.4 12/20/2011   MCV 86.1 12/20/2011   PLT 91* 12/20/2011      Chemistry      Component Value Date/Time   NA 140 12/20/2011 1047   NA 143 06/15/2011 0956   NA 139 12/18/2010 1219   K 4.3 12/20/2011 1047   K 4.2 06/15/2011 0956   K 4.5 12/18/2010 1219   CL 107 12/20/2011 1047   CL 107 06/15/2011 0956   CL 103 12/18/2010 1219   CO2 25 12/20/2011 1047   CO2 25 06/15/2011 0956   CO2 26 12/18/2010 1219   BUN 9.0 12/20/2011 1047   BUN 10 06/15/2011 0956   BUN 9 12/18/2010 1219   CREATININE 0.9 12/20/2011 1047   CREATININE 0.96 06/15/2011 0956   CREATININE 1.2 12/18/2010 1219      Component Value Date/Time   CALCIUM 9.4 12/20/2011 1047   CALCIUM 9.4 06/15/2011 0956   CALCIUM 8.8 12/18/2010 1219   ALKPHOS 102 12/20/2011 1047   ALKPHOS 90 06/15/2011 0956   ALKPHOS 104* 12/18/2010 1219   AST 73* 12/20/2011 1047   AST 79* 06/15/2011 0956   AST 145* 12/18/2010 1219   ALT 51  12/20/2011 1047   ALT 45 06/15/2011 0956   BILITOT 2.20* 12/20/2011 1047   BILITOT 2.6* 06/15/2011 0956   BILITOT 2.40* 12/18/2010 1219       RADIOGRAPHIC STUDIES: Ct Chest W Contrast  12/20/2011  *RADIOLOGY REPORT*  Clinical Data:  Non Hodgkin's lymphoma with chemotherapy complete.  CT CHEST, ABDOMEN AND PELVIS WITH CONTRAST  Technique:  Multidetector CT imaging of the chest, abdomen and pelvis was performed following the standard protocol during bolus administration of intravenous contrast.  Contrast: OMNIPAQUE IOHEXOL 300 MG/ML  SOLN  Comparison:  CT the 06/16/2011  CT CHEST  Findings:  No axillary or supraclavicular  lymphadenopathy.  No mediastinal or hilar lymphadenopathy.  No pericardial fluid.  No new or suspicious pulmonary nodules.  IMPRESSION: No evidence of lymphoma within the thorax.  CT ABDOMEN AND PELVIS  Findings:  No focal hepatic lesion.  The gallbladder pancreas normal.  The spleen appears mildly enlarged with a heterogeneous enhancement pattern but not changed from prior. Calculated volume of the spleen is 566 ml compared to 550 cc on prior.  No significant change.  No retroperitoneal lymphadenopathy.  No mesenteric lymphadenopathy.  The stomach, small bowel, appendix, and colon are normal. Abdominal aorta is normal caliber.  The bladder and the prostate gland are normal.  Left inguinal lymph node measures 26 x 19 mm (image 120) not changed from 21 x 27 mm on prior.  Review of  bone windows demonstrates no aggressive osseous lesions.  IMPRESSION:  1.  No evidence of lymphoma recurrence within the abdomen or pelvis. 2.  Stable mild splenomegaly. 3.  Stable left inguinal lymph node.   Original Report Authenticated By: Genevive Bi, M.D.    Ct Abdomen Pelvis W Contrast  12/20/2011  *RADIOLOGY REPORT*  Clinical Data:  Non Hodgkin's lymphoma with chemotherapy complete.  CT CHEST, ABDOMEN AND PELVIS WITH CONTRAST  Technique:  Multidetector CT imaging of the chest, abdomen and pelvis was performed following the standard protocol during bolus administration of intravenous contrast.  Contrast: OMNIPAQUE IOHEXOL 300 MG/ML  SOLN  Comparison:  CT the 06/16/2011  CT CHEST  Findings:  No axillary or supraclavicular lymphadenopathy.  No mediastinal or hilar lymphadenopathy.  No pericardial fluid.  No new or suspicious pulmonary nodules.  IMPRESSION: No evidence of lymphoma within the thorax.  CT ABDOMEN AND PELVIS  Findings:  No focal hepatic lesion.  The gallbladder pancreas normal.  The spleen appears mildly enlarged with a heterogeneous enhancement pattern but not changed from prior. Calculated volume of the  spleen is 566 ml compared to 550 cc on prior.  No significant change.  No retroperitoneal lymphadenopathy.  No mesenteric lymphadenopathy.  The stomach, small bowel, appendix, and colon are normal. Abdominal aorta is normal caliber.  The bladder and the prostate gland are normal.  Left inguinal lymph node measures 26 x 19 mm (image 120) not changed from 21 x 27 mm on prior.  Review of  bone windows demonstrates no aggressive osseous lesions.  IMPRESSION:  1.  No evidence of lymphoma recurrence within the abdomen or pelvis. 2.  Stable mild splenomegaly. 3.  Stable left inguinal lymph node.   Original Report Authenticated By: Genevive Bi, M.D.     ASSESSMENT: This is a very pleasant 51 years old [place] American male with stage IV non-Hodgkin lymphoma status post systemic chemotherapy with CHOP/Rituxan and has been observation since September of 2009 with no evidence for disease recurrence.  PLAN: I discussed the scan results with the patient and  his sister. I recommended for him to continue on observation for now with repeat CT scan of the chest, abdomen and pelvis in 6 months. The patient continues to have chronic Leukocytopenia and hyperbilirubinemia which is secondary to his psychiatric medication and his lab will need close monitoring by his psychiatrist. The patient was advised to call me immediately if he has any concerning symptoms in the interval.  All questions were answered. The patient knows to call the clinic with any problems, questions or concerns. We can certainly see the patient much sooner if necessary.

## 2011-12-22 NOTE — Patient Instructions (Addendum)
Your scan looks fine. Followup in 6 months

## 2012-01-07 DIAGNOSIS — F200 Paranoid schizophrenia: Secondary | ICD-10-CM | POA: Diagnosis not present

## 2012-03-29 DIAGNOSIS — F200 Paranoid schizophrenia: Secondary | ICD-10-CM | POA: Diagnosis not present

## 2012-06-20 ENCOUNTER — Other Ambulatory Visit (HOSPITAL_BASED_OUTPATIENT_CLINIC_OR_DEPARTMENT_OTHER): Payer: Medicare Other | Admitting: Lab

## 2012-06-20 ENCOUNTER — Encounter (HOSPITAL_COMMUNITY): Payer: Self-pay

## 2012-06-20 ENCOUNTER — Ambulatory Visit (HOSPITAL_COMMUNITY)
Admission: RE | Admit: 2012-06-20 | Discharge: 2012-06-20 | Disposition: A | Discharge status: Home / Self Care | Payer: Medicare Other | Source: Ambulatory Visit | Attending: Internal Medicine | Admitting: Internal Medicine

## 2012-06-20 DIAGNOSIS — J438 Other emphysema: Secondary | ICD-10-CM | POA: Diagnosis not present

## 2012-06-20 DIAGNOSIS — R599 Enlarged lymph nodes, unspecified: Secondary | ICD-10-CM | POA: Insufficient documentation

## 2012-06-20 DIAGNOSIS — C8589 Other specified types of non-Hodgkin lymphoma, extranodal and solid organ sites: Secondary | ICD-10-CM

## 2012-06-20 DIAGNOSIS — R161 Splenomegaly, not elsewhere classified: Secondary | ICD-10-CM | POA: Diagnosis not present

## 2012-06-20 DIAGNOSIS — K429 Umbilical hernia without obstruction or gangrene: Secondary | ICD-10-CM | POA: Insufficient documentation

## 2012-06-20 DIAGNOSIS — Z9221 Personal history of antineoplastic chemotherapy: Secondary | ICD-10-CM | POA: Insufficient documentation

## 2012-06-20 LAB — COMPREHENSIVE METABOLIC PANEL (CC13)
ALT: 50 U/L (ref 0–55)
AST: 52 U/L — ABNORMAL HIGH (ref 5–34)
Albumin: 3.5 g/dL (ref 3.5–5.0)
Alkaline Phosphatase: 96 U/L (ref 40–150)
BUN: 9 mg/dL (ref 7.0–26.0)
CO2: 24 mEq/L (ref 22–29)
Calcium: 9.4 mg/dL (ref 8.4–10.4)
Chloride: 107 mEq/L (ref 98–107)
Creatinine: 1.1 mg/dL (ref 0.7–1.3)
Glucose: 108 mg/dl — ABNORMAL HIGH (ref 70–99)
Potassium: 4.1 mEq/L (ref 3.5–5.1)
Sodium: 141 mEq/L (ref 136–145)
Total Bilirubin: 1.49 mg/dL — ABNORMAL HIGH (ref 0.20–1.20)
Total Protein: 7.9 g/dL (ref 6.4–8.3)

## 2012-06-20 LAB — CBC WITH DIFFERENTIAL/PLATELET
BASO%: 0.3 % (ref 0.0–2.0)
Basophils Absolute: 0 10*3/uL (ref 0.0–0.1)
EOS%: 0.9 % (ref 0.0–7.0)
Eosinophils Absolute: 0 10*3/uL (ref 0.0–0.5)
HCT: 42.9 % (ref 38.4–49.9)
HGB: 14.4 g/dL (ref 13.0–17.1)
LYMPH%: 24.8 % (ref 14.0–49.0)
MCH: 27.7 pg (ref 27.2–33.4)
MCHC: 33.5 g/dL (ref 32.0–36.0)
MCV: 82.8 fL (ref 79.3–98.0)
MONO#: 0.5 10*3/uL (ref 0.1–0.9)
MONO%: 12 % (ref 0.0–14.0)
NEUT#: 2.5 10*3/uL (ref 1.5–6.5)
NEUT%: 62 % (ref 39.0–75.0)
Platelets: 93 10*3/uL — ABNORMAL LOW (ref 140–400)
RBC: 5.18 10*6/uL (ref 4.20–5.82)
RDW: 12.9 % (ref 11.0–14.6)
WBC: 4.1 10*3/uL (ref 4.0–10.3)
lymph#: 1 10*3/uL (ref 0.9–3.3)

## 2012-06-20 LAB — LACTATE DEHYDROGENASE (CC13): LDH: 173 U/L (ref 125–245)

## 2012-06-20 MED ORDER — IOHEXOL 300 MG/ML  SOLN
100.0000 mL | Freq: Once | INTRAMUSCULAR | Status: AC | PRN
  Administered 2012-06-20: 100 mL via INTRAVENOUS

## 2012-06-26 ENCOUNTER — Telehealth: Payer: Self-pay | Admitting: Internal Medicine

## 2012-06-26 ENCOUNTER — Encounter: Payer: Self-pay | Admitting: Internal Medicine

## 2012-06-26 ENCOUNTER — Ambulatory Visit (HOSPITAL_BASED_OUTPATIENT_CLINIC_OR_DEPARTMENT_OTHER): Payer: Medicare Other | Admitting: Internal Medicine

## 2012-06-26 VITALS — BP 172/90 | HR 77 | Temp 97.4°F | Resp 20 | Ht 68.0 in | Wt 222.6 lb

## 2012-06-26 DIAGNOSIS — C8589 Other specified types of non-Hodgkin lymphoma, extranodal and solid organ sites: Secondary | ICD-10-CM

## 2012-06-26 DIAGNOSIS — Z87898 Personal history of other specified conditions: Secondary | ICD-10-CM | POA: Diagnosis not present

## 2012-06-26 NOTE — Patient Instructions (Signed)
No evidence for disease recurrence on the recent scan.  Followup in one year with repeat CT scan of the chest, abdomen and pelvis.  Please call if he has any concerning symptoms in the interval.

## 2012-06-26 NOTE — Progress Notes (Signed)
[facility] Telephone:(336) [phone]   Fax:(336) [phone]  OFFICE PROGRESS NOTE  PRINCIPAL DIAGNOSIS: Stage IV non-Hodgkin lymphoma diagnosed in March 2009.   PRIOR THERAPY:  1. Status post 4 weekly doses of Rituxan during the patient's hospitalization at the intensive care unit at [facility] with respiratory failure. 2. Status post 7 cycles of systemic chemotherapy with CHOP/Rituxan. Last dose was given January 15, 2008.  CURRENT THERAPY: Observation.  INTERVAL HISTORY: [name] 52 y.o. male returns to the clinic today for six-month followup visit. The patient is feeling fine today with no specific complaints. He denied having any significant weight loss or night sweats. He has no palpable lymphadenopathy. He denied having any significant chest pain, shortness breath, cough or hemoptysis. The patient had repeat CT scan of the chest, abdomen and pelvis performed recently and he is here for evaluation and discussion of his scan results.  MEDICAL HISTORY: Past Medical History  Diagnosis Date  . Hepatitis C     not treatable  . Hypertension   . Schizophrenia   . Hemorrhoids   . Rectal bleeding   . Abdominal pain   . Non Hodgkin's lymphoma   . Cancer     lymphoma    ALLERGIES:  has No Known Allergies.  MEDICATIONS:  Current Outpatient Prescriptions  Medication Sig Dispense Refill  . benztropine (COGENTIN) 1 MG tablet Take 1 mg by mouth 2 (two) times daily.      Marland Kitchen OLANZapine (ZYPREXA) 20 MG tablet Take 20 mg by mouth at bedtime.         No current facility-administered medications for this visit.    REVIEW OF SYSTEMS:  A comprehensive review of systems was negative.   PHYSICAL EXAMINATION: General appearance: alert, cooperative and no distress Head: Normocephalic, without obvious abnormality, atraumatic Neck: no adenopathy Lymph nodes: Cervical, supraclavicular, and axillary nodes normal. Resp: clear to auscultation bilaterally Cardio:  regular rate and rhythm, S1, S2 normal, no murmur, click, rub or gallop GI: soft, non-tender; bowel sounds normal; no masses,  no organomegaly Extremities: extremities normal, atraumatic, no cyanosis or edema  ECOG PERFORMANCE STATUS: 0 - Asymptomatic  Blood pressure 172/90, pulse 77, temperature 97.4 F (36.3 C), temperature source Oral, resp. rate 20, height 5\' 8"  (1.727 m), weight 222 lb 9.6 oz (100.971 kg).  LABORATORY DATA: Lab Results  Component Value Date   WBC 4.1 06/20/2012   HGB 14.4 06/20/2012   HCT 42.9 06/20/2012   MCV 82.8 06/20/2012   PLT 93* 06/20/2012      Chemistry      Component Value Date/Time   NA 141 06/20/2012 1032   NA 143 06/15/2011 0956   NA 139 12/18/2010 1219   K 4.1 06/20/2012 1032   K 4.2 06/15/2011 0956   K 4.5 12/18/2010 1219   CL 107 06/20/2012 1032   CL 107 06/15/2011 0956   CL 103 12/18/2010 1219   CO2 24 06/20/2012 1032   CO2 25 06/15/2011 0956   CO2 26 12/18/2010 1219   BUN 9.0 06/20/2012 1032   BUN 10 06/15/2011 0956   BUN 9 12/18/2010 1219   CREATININE 1.1 06/20/2012 1032   CREATININE 0.96 06/15/2011 0956   CREATININE 1.2 12/18/2010 1219      Component Value Date/Time   CALCIUM 9.4 06/20/2012 1032   CALCIUM 9.4 06/15/2011 0956   CALCIUM 8.8 12/18/2010 1219   ALKPHOS 96 06/20/2012 1032   ALKPHOS 90 06/15/2011 0956   ALKPHOS 104* 12/18/2010 1219  AST 52* 06/20/2012 1032   AST 79* 06/15/2011 0956   AST 145* 12/18/2010 1219   ALT 50 06/20/2012 1032   ALT 45 06/15/2011 0956   BILITOT 1.49* 06/20/2012 1032   BILITOT 2.6* 06/15/2011 0956   BILITOT 2.40* 12/18/2010 1219       RADIOGRAPHIC STUDIES: Ct Chest W Contrast  06/20/2012  *RADIOLOGY REPORT*  Clinical Data:  Restaging non-Hodgkins lymphoma diagnosed in 2009. Chemotherapy completed.  CT CHEST, ABDOMEN AND PELVIS WITH CONTRAST  Technique:  Multidetector CT imaging of the chest, abdomen and pelvis was performed following the standard protocol during bolus administration of intravenous contrast.  Contrast:  OMNIPAQUE IOHEXOL 300 MG/ML  SOLN  Comparison:  Prior examinations 12/20/2011 and 06/16/2011.   CT CHEST  Findings:  There are no enlarged mediastinal, hilar or axillary lymph nodes.  There is no pleural or pericardial effusion.  The heart and great vessels appear unchanged.  Emphysematous changes and mild scarring at the left lung base are stable.  There is no airspace disease or suspicious pulmonary nodule.  IMPRESSION: Stable chest CT.  No evidence of recurrent lymphoma.   CT ABDOMEN AND PELVIS  Findings:  There is stable heterogeneous enlargement of the spleen which measures 13.6 x 6.3 cm transverse for an estimated volume of 561 ml (similar to prior study).  The liver, gallbladder, biliary system and pancreas appear normal.  There is no adrenal mass.  The kidneys appear normal.  There is no hydronephrosis.  There are no pathologically enlarged abdominal pelvic lymph nodes. The left inguinal node has not significantly changed, measuring 2.5 x 1.8 cm on image 120.  The bladder and prostate gland appear unchanged.  No abnormalities of the stomach, small bowel, appendix or colon are seen. There is a stable paraumbilical hernia containing fat.  There are no worrisome osseous findings.  IMPRESSION:  1.  Stable splenomegaly with mild heterogeneity. 2.  Stable mildly enlarged left inguinal lymph node.  No other abdominal pelvic adenopathy identified. 3.  No acute abdominal pelvic findings.   Original Report Authenticated By: Carey Bullocks, M.D.    ASSESSMENT: This is a very pleasant 52 years old [place] American male with history of stage IV non-Hodgkin lymphoma diagnosed in March of 2009 status post systemic chemotherapy and has been observation since September of 2009 with no evidence for disease recurrence.   PLAN: I discussed the scan results with the patient today. I recommended for him to continue on observation with repeat CT scan of the chest, abdomen and pelvis in one year. The patient was  advised to call me immediately if he has any concerning symptoms in the interval.  All questions were answered. The patient knows to call the clinic with any problems, questions or concerns. We can certainly see the patient much sooner if necessary.

## 2012-06-26 NOTE — Telephone Encounter (Signed)
Gave pt appt for lab and MD, also gave pt oral contrast for CT on 2014 February

## 2012-07-19 DIAGNOSIS — F200 Paranoid schizophrenia: Secondary | ICD-10-CM | POA: Diagnosis not present

## 2012-10-04 DIAGNOSIS — H1045 Other chronic allergic conjunctivitis: Secondary | ICD-10-CM | POA: Diagnosis not present

## 2012-10-04 DIAGNOSIS — H16109 Unspecified superficial keratitis, unspecified eye: Secondary | ICD-10-CM | POA: Diagnosis not present

## 2012-10-17 DIAGNOSIS — F200 Paranoid schizophrenia: Secondary | ICD-10-CM | POA: Diagnosis not present

## 2012-11-02 DIAGNOSIS — H16109 Unspecified superficial keratitis, unspecified eye: Secondary | ICD-10-CM | POA: Diagnosis not present

## 2012-11-02 DIAGNOSIS — H04129 Dry eye syndrome of unspecified lacrimal gland: Secondary | ICD-10-CM | POA: Diagnosis not present

## 2013-01-03 DIAGNOSIS — H04129 Dry eye syndrome of unspecified lacrimal gland: Secondary | ICD-10-CM | POA: Diagnosis not present

## 2013-01-03 DIAGNOSIS — H16109 Unspecified superficial keratitis, unspecified eye: Secondary | ICD-10-CM | POA: Diagnosis not present

## 2013-01-10 DIAGNOSIS — F200 Paranoid schizophrenia: Secondary | ICD-10-CM | POA: Diagnosis not present

## 2013-01-19 ENCOUNTER — Ambulatory Visit (INDEPENDENT_AMBULATORY_CARE_PROVIDER_SITE_OTHER): Payer: Medicare Other | Admitting: Internal Medicine

## 2013-01-19 ENCOUNTER — Encounter: Payer: Self-pay | Admitting: Internal Medicine

## 2013-01-19 VITALS — BP 157/91 | HR 95 | Temp 98.0°F | Ht 68.0 in | Wt 225.5 lb

## 2013-01-19 DIAGNOSIS — C8589 Other specified types of non-Hodgkin lymphoma, extranodal and solid organ sites: Secondary | ICD-10-CM | POA: Diagnosis not present

## 2013-01-19 DIAGNOSIS — L989 Disorder of the skin and subcutaneous tissue, unspecified: Secondary | ICD-10-CM | POA: Diagnosis not present

## 2013-01-19 DIAGNOSIS — Z23 Encounter for immunization: Secondary | ICD-10-CM | POA: Diagnosis not present

## 2013-01-19 DIAGNOSIS — I10 Essential (primary) hypertension: Secondary | ICD-10-CM | POA: Diagnosis not present

## 2013-01-19 LAB — BASIC METABOLIC PANEL
BUN: 9 mg/dL (ref 6–23)
CO2: 26 mEq/L (ref 19–32)
Calcium: 9.4 mg/dL (ref 8.4–10.5)
Chloride: 107 mEq/L (ref 96–112)
Creat: 0.89 mg/dL (ref 0.50–1.35)
Glucose, Bld: 96 mg/dL (ref 70–99)
Potassium: 3.9 mEq/L (ref 3.5–5.3)
Sodium: 142 mEq/L (ref 135–145)

## 2013-01-19 LAB — LIPID PANEL
Cholesterol: 133 mg/dL (ref 0–200)
HDL: 61 mg/dL (ref >39)
LDL Cholesterol: 53 mg/dL (ref 0–99)
Total CHOL/HDL Ratio: 2.2 Ratio
Triglycerides: 97 mg/dL (ref <150)
VLDL: 19 mg/dL (ref 0–40)

## 2013-01-19 MED ORDER — FLUOROURACIL 5 % EX CREA: TOPICAL_CREAM | Freq: Two times a day (BID) | CUTANEOUS | Status: DC

## 2013-01-19 MED ORDER — HYDROCHLOROTHIAZIDE 12.5 MG PO CAPS: 12.5000 mg | ORAL_CAPSULE | Freq: Every day | ORAL | Status: DC

## 2013-01-19 NOTE — Patient Instructions (Signed)
1. Please schedule a follow up appointment for 6-8 weeks for follow up involving your new BP medication.  2. Please take all medications as prescribed. Please take HCTZ 12.5 mg once daily.   3. If you have worsening of your symptoms or new symptoms arise, please call the clinic ([phone]), or go to the ER immediately if symptoms are severe.  You have done a great job in taking all your medications. I appreciate it very much. Please continue doing that.

## 2013-01-19 NOTE — Progress Notes (Signed)
Patient ID: [name], male   DOB: 10/10/1960, 52 y.o.   MRN: [id number]   Subjective:   Patient ID: [name] male   DOB: 1960-05-02 52 y.o.   MRN: [id number]  HPI: [name] is a 52 y.o. M w/ PMHx of Non-Hodgkins Lymphoma (now in remission), HTN, Hep C, and Schizophrenia, presents to the clinic today for follow up. The patient has not been seen in the [facility] clinic since 2012, and is simply here today for a check up. He denies any health issues at this time but does claim that he was recently sen at [facility] and was told his blood pressure was elevated. On further review, the patient has had elevated BP during previous office visits this year to his oncologist as well. He has never been on BP medications before.  The patient also states that he has multiple skin lesions on his arms and right hand that have been present for some time. He has seen a dermatologist in the past for these and has had some of them "burned off" and treated with a cream which he does not know the name of. On exam, they appear to be wart-like lesions. He denies any pain or discomfort in these areas but does not like the way they look and would like to have them taken care of. Otherwise, the patient denies any further issues. He follows up with mental health regularly for his schizophrenia and also sees his oncologist yearly for his previous history of Non-Hodgkins Lymphoma.  Past Medical History  Diagnosis Date  . Hepatitis C     not treatable  . Hypertension   . Schizophrenia   . Hemorrhoids   . Rectal bleeding   . Abdominal pain   . Non Hodgkin's lymphoma   . Cancer     lymphoma   Current Outpatient Prescriptions  Medication Sig Dispense Refill  . benztropine (COGENTIN) 1 MG tablet Take 1 mg by mouth 2 (two) times daily.      Marland Kitchen OLANZapine (ZYPREXA) 20 MG tablet Take 20 mg by mouth at bedtime.         No current facility-administered medications for this visit.   No family history on file. History    Social History  . Marital Status: Single    Spouse Name: N/A    Number of Children: N/A  . Years of Education: N/A   Social History Main Topics  . Smoking status: Former Games developer  . Smokeless tobacco: None  . Alcohol Use: No  . Drug Use: No  . Sexual Activity: None   Other Topics Concern  . None   Social History Narrative  . None   Review of Systems: General: Denies fever, chills, diaphoresis, appetite change and fatigue.  Respiratory: Denies SOB, DOE, cough, chest tightness, and wheezing.   Cardiovascular: Denies chest pain, palpitations and leg swelling.  Gastrointestinal: Denies nausea, vomiting, abdominal pain, diarrhea, constipation, blood in stool and abdominal distention.  Genitourinary: Denies dysuria, urgency, frequency, hematuria, flank pain and difficulty urinating.  Endocrine: Denies hot or cold intolerance, sweats, polyuria, polydipsia. Musculoskeletal: Denies myalgias, back pain, joint swelling, arthralgias and gait problem.  Skin: Positive for multiple wart-like skin lesions. Denies pallor, rash and wounds.  Neurological: Denies dizziness, seizures, syncope, weakness, lightheadedness, numbness and headaches.  Psychiatric/Behavioral: Denies mood changes, confusion, nervousness, sleep disturbance and agitation.  Objective:  Physical Exam: Filed Vitals:   01/19/13 1325  BP: 157/91  Pulse: 95  Temp: 98 F (36.7 C)  TempSrc: Oral  Height: 5\' 8"  (1.727 m)  Weight: 225 lb 8 oz (102.286 kg)  SpO2: 97%   General: Vital signs reviewed.  Patient is a well-developed and well-nourished, in no acute distress and cooperative with exam. Alert and oriented x3.  Head: Normocephalic and atraumatic. Eyes: PERRL, EOMI, conjunctivae normal, No scleral icterus.  Neck: Supple, trachea midline, normal ROM, No JVD, masses, thyromegaly, or carotid bruit present.  Cardiovascular: RRR, S1 normal, S2 normal, no murmurs, gallops, or rubs. Pulmonary/Chest: Normal respiratory effort,  CTAB, no wheezes, rales, or rhonchi. Abdominal: Soft, non-tender, non-distended, bowel sounds are normal, no masses, organomegaly, or guarding present.  Musculoskeletal: No joint deformities, erythema, or stiffness, ROM full and no nontender. Extremities: No swelling or edema,  pulses symmetric and intact bilaterally. No cyanosis or clubbing. Neurological: A&O x3, Strength is normal and symmetric bilaterally, cranial nerve II-XII are grossly intact, no focal motor deficit, sensory intact to light touch bilaterally.  Skin: Positive for multiple (6-7) skin lesions on UE's. Wart-like, dry, 5x5 mm in size. No surrounding erythema, tenderness, or exudate. Warm, dry and intact. No rashes or erythema. Psychiatric: Normal mood and affect. speech and behavior is normal. Cognition and memory are normal.   Assessment & Plan:   Please see problem-based assessment and plan.

## 2013-01-20 ENCOUNTER — Encounter: Payer: Self-pay | Admitting: Internal Medicine

## 2013-01-20 NOTE — Assessment & Plan Note (Signed)
Patient with multiple wart-like lesions, mostly on forearms. He says he has had them treated with a cream in the past and has had some "burned off" by a dermatologist. Does not recall the name of the dermatologist or the cream. Records indicate he may have gone to Dr. Terri Piedra? In the past.  -Called pharmacy to inquire about cream and said that he has received Efudex in the past, a 5-FU formulation, mostly for basal cell carcinoma, etc. Not comfortable with refilling this medication at this time for these lesions. Jovita Gamma referral for dermatologist.

## 2013-01-20 NOTE — Assessment & Plan Note (Signed)
Patient presents with blood pressure of 157/91. On review, patient had BP of 172/90 on 06/26/12 and 147/86 on 12/21/12. Patient has never been on BP meds before. He denies any recent HA, chest pain, dizziness, lightheadedness, intermittent claudication, leg swelling, etc.  -Given history of multiple elevated blood pressures, will start low dose HCTZ, 12.5 mg qd for now and have patient return to the clinic in 6 weeks for blood pressure recheck. -BMET shows no electrolyte abnormalities. -Lipid panel shows HDL of 61 and LDL of 53, total cholesterol of 133. No intervention needed at this time.

## 2013-01-23 NOTE — Progress Notes (Signed)
I saw and evaluated the patient.  I personally confirmed the key portions of the history and exam documented by Dr. Jones and I reviewed pertinent patient test results.  The assessment, diagnosis, and plan were formulated together and I agree with the documentation in the resident's note.   

## 2013-01-24 ENCOUNTER — Encounter (HOSPITAL_COMMUNITY): Payer: Self-pay | Admitting: Emergency Medicine

## 2013-01-24 ENCOUNTER — Emergency Department (HOSPITAL_COMMUNITY): Payer: Medicare Other

## 2013-01-24 ENCOUNTER — Emergency Department (HOSPITAL_COMMUNITY)
Admission: EM | Admit: 2013-01-24 | Discharge: 2013-01-24 | Disposition: A | Discharge status: Home / Self Care | Payer: Medicare Other | Attending: Emergency Medicine | Admitting: Emergency Medicine

## 2013-01-24 ENCOUNTER — Telehealth (HOSPITAL_COMMUNITY): Payer: Self-pay | Admitting: *Deleted

## 2013-01-24 DIAGNOSIS — L0201 Cutaneous abscess of face: Secondary | ICD-10-CM | POA: Diagnosis not present

## 2013-01-24 DIAGNOSIS — R6510 Systemic inflammatory response syndrome (SIRS) of non-infectious origin without acute organ dysfunction: Secondary | ICD-10-CM | POA: Diagnosis not present

## 2013-01-24 DIAGNOSIS — Z87891 Personal history of nicotine dependence: Secondary | ICD-10-CM | POA: Diagnosis not present

## 2013-01-24 DIAGNOSIS — I10 Essential (primary) hypertension: Secondary | ICD-10-CM | POA: Diagnosis not present

## 2013-01-24 DIAGNOSIS — Z79899 Other long term (current) drug therapy: Secondary | ICD-10-CM | POA: Diagnosis not present

## 2013-01-24 DIAGNOSIS — K047 Periapical abscess without sinus: Secondary | ICD-10-CM | POA: Diagnosis not present

## 2013-01-24 DIAGNOSIS — A419 Sepsis, unspecified organism: Secondary | ICD-10-CM | POA: Diagnosis not present

## 2013-01-24 DIAGNOSIS — R51 Headache: Secondary | ICD-10-CM | POA: Diagnosis not present

## 2013-01-24 DIAGNOSIS — R599 Enlarged lymph nodes, unspecified: Secondary | ICD-10-CM | POA: Diagnosis not present

## 2013-01-24 DIAGNOSIS — R5381 Other malaise: Secondary | ICD-10-CM | POA: Diagnosis not present

## 2013-01-24 DIAGNOSIS — Z87898 Personal history of other specified conditions: Secondary | ICD-10-CM | POA: Diagnosis not present

## 2013-01-24 DIAGNOSIS — R0602 Shortness of breath: Secondary | ICD-10-CM | POA: Insufficient documentation

## 2013-01-24 DIAGNOSIS — C8589 Other specified types of non-Hodgkin lymphoma, extranodal and solid organ sites: Secondary | ICD-10-CM | POA: Diagnosis not present

## 2013-01-24 DIAGNOSIS — Z8719 Personal history of other diseases of the digestive system: Secondary | ICD-10-CM | POA: Diagnosis not present

## 2013-01-24 DIAGNOSIS — F2089 Other schizophrenia: Secondary | ICD-10-CM | POA: Insufficient documentation

## 2013-01-24 DIAGNOSIS — M2550 Pain in unspecified joint: Secondary | ICD-10-CM | POA: Diagnosis not present

## 2013-01-24 DIAGNOSIS — D696 Thrombocytopenia, unspecified: Secondary | ICD-10-CM | POA: Diagnosis not present

## 2013-01-24 DIAGNOSIS — L03211 Cellulitis of face: Secondary | ICD-10-CM | POA: Diagnosis not present

## 2013-01-24 DIAGNOSIS — Z8619 Personal history of other infectious and parasitic diseases: Secondary | ICD-10-CM | POA: Insufficient documentation

## 2013-01-24 DIAGNOSIS — M542 Cervicalgia: Secondary | ICD-10-CM | POA: Insufficient documentation

## 2013-01-24 LAB — CBC WITH DIFFERENTIAL/PLATELET
Basophils Absolute: 0 10*3/uL (ref 0.0–0.1)
Basophils Relative: 0 % (ref 0–1)
Eosinophils Absolute: 0 10*3/uL (ref 0.0–0.7)
Eosinophils Relative: 0 % (ref 0–5)
HCT: 44.8 % (ref 39.0–52.0)
Hemoglobin: 16 g/dL (ref 13.0–17.0)
Lymphocytes Relative: 14 % (ref 12–46)
Lymphs Abs: 1.2 10*3/uL (ref 0.7–4.0)
MCH: 29.1 pg (ref 26.0–34.0)
MCHC: 35.7 g/dL (ref 30.0–36.0)
MCV: 81.5 fL (ref 78.0–100.0)
Monocytes Absolute: 1.2 10*3/uL — ABNORMAL HIGH (ref 0.1–1.0)
Monocytes Relative: 15 % — ABNORMAL HIGH (ref 3–12)
Neutro Abs: 5.6 10*3/uL (ref 1.7–7.7)
Neutrophils Relative %: 70 % (ref 43–77)
Platelets: 114 10*3/uL — ABNORMAL LOW (ref 150–400)
RBC: 5.5 MIL/uL (ref 4.22–5.81)
RDW: 12.7 % (ref 11.5–15.5)
WBC: 8 10*3/uL (ref 4.0–10.5)

## 2013-01-24 LAB — CG4 I-STAT (LACTIC ACID): Lactic Acid, Venous: 1.71 mmol/L (ref 0.5–2.2)

## 2013-01-24 LAB — COMPREHENSIVE METABOLIC PANEL
ALT: 31 U/L (ref 0–53)
AST: 35 U/L (ref 0–37)
Albumin: 3.8 g/dL (ref 3.5–5.2)
Alkaline Phosphatase: 78 U/L (ref 39–117)
BUN: 12 mg/dL (ref 6–23)
CO2: 22 mEq/L (ref 19–32)
Calcium: 9.2 mg/dL (ref 8.4–10.5)
Chloride: 100 mEq/L (ref 96–112)
Creatinine, Ser: 0.97 mg/dL (ref 0.50–1.35)
GFR calc Af Amer: >90 mL/min (ref >90)
GFR calc non Af Amer: >90 mL/min (ref >90)
Glucose, Bld: 106 mg/dL — ABNORMAL HIGH (ref 70–99)
Potassium: 4.3 mEq/L (ref 3.5–5.1)
Sodium: 136 mEq/L (ref 135–145)
Total Bilirubin: 3.4 mg/dL — ABNORMAL HIGH (ref 0.3–1.2)
Total Protein: 8.9 g/dL — ABNORMAL HIGH (ref 6.0–8.3)

## 2013-01-24 LAB — URINALYSIS, ROUTINE W REFLEX MICROSCOPIC
Glucose, UA: NEGATIVE mg/dL
Ketones, ur: 15 mg/dL — AB
Leukocytes, UA: NEGATIVE
Nitrite: NEGATIVE
Protein, ur: 30 mg/dL — AB
Specific Gravity, Urine: 1.01 (ref 1.005–1.030)
Urobilinogen, UA: 4 mg/dL — ABNORMAL HIGH (ref 0.0–1.0)
pH: 5.5 (ref 5.0–8.0)

## 2013-01-24 LAB — URINE MICROSCOPIC-ADD ON

## 2013-01-24 MED ORDER — IOHEXOL 300 MG/ML  SOLN
100.0000 mL | Freq: Once | INTRAMUSCULAR | Status: AC | PRN
  Administered 2013-01-24: 100 mL via INTRAVENOUS

## 2013-01-24 MED ORDER — SODIUM CHLORIDE 0.9 % IV SOLN
1000.0000 mL | Freq: Once | INTRAVENOUS | Status: AC
  Administered 2013-01-24: 1000 mL via INTRAVENOUS

## 2013-01-24 MED ORDER — AMOXICILLIN 500 MG PO CAPS: 500.0000 mg | ORAL_CAPSULE | Freq: Three times a day (TID) | ORAL | Status: DC

## 2013-01-24 MED ORDER — SODIUM CHLORIDE 0.9 % IV SOLN: 1000.0000 mL | INTRAVENOUS | Status: DC

## 2013-01-24 MED ORDER — SODIUM CHLORIDE 0.9 % IV BOLUS (SEPSIS): 1000.0000 mL | Freq: Once | INTRAVENOUS | Status: DC

## 2013-01-24 MED ORDER — ACETAMINOPHEN 325 MG PO TABS
650.0000 mg | ORAL_TABLET | Freq: Once | ORAL | Status: AC
  Administered 2013-01-24: 650 mg via ORAL
  Filled 2013-01-24: qty 2

## 2013-01-24 MED ORDER — SODIUM CHLORIDE 0.9 % IV SOLN: 1000.0000 mL | Freq: Once | INTRAVENOUS | Status: DC

## 2013-01-24 NOTE — ED Notes (Signed)
Left side of face swelling  And llymph nodes  Swelling has a h/a and cough x 2 days

## 2013-01-24 NOTE — ED Notes (Signed)
Arthor Captain [facility] called and requested that we contact patient and notify him that he had blood in Urine and he needs to follow up with PCP.

## 2013-01-24 NOTE — ED Provider Notes (Signed)
CSN: [id number]     Arrival date & time 01/24/13  0913 History   First MD Initiated Contact with Patient 01/24/13 1036     Chief Complaint  Patient presents with  . Dental Pain   (Consider location/radiation/quality/duration/timing/severity/associated sxs/prior Treatment) HPI  [name] is a 52 year old male with past medical history of hepatitis C, hypertension, schizophrenia, history of non-Hodgkin's lymphoma which is in remission.  He presents to the emergency department with 3 days of worsening pain in the left jaw and neck, fever, myalgias, chills, malaise.  Patient also complains of shortness of breath however he denies any chest pain, productive cough.  Patient denies any urinary symptoms or abdominal pain.  Patient complains of swelling in the left lower mandible.  He is a known cavity in the left lower canine.  Patient has tender swelling in the anterior throat but denies any difficulty swallowing.  He also denies pain with swallowing.   Past Medical History  Diagnosis Date  . Hepatitis C     not treatable  . Hypertension   . Schizophrenia   . Hemorrhoids   . Rectal bleeding   . Abdominal pain   . Non Hodgkin's lymphoma   . Cancer     lymphoma   History reviewed. No pertinent past surgical history. No family history on file. History  Substance Use Topics  . Smoking status: Former Games developer  . Smokeless tobacco: Not on file  . Alcohol Use: No    Review of Systems  Unable to perform ROS Constitutional: Positive for fever, chills, activity change and appetite change.  HENT: Positive for facial swelling, neck pain and dental problem. Negative for sore throat, trouble swallowing and voice change.   Eyes: Negative for visual disturbance.  Respiratory: Positive for shortness of breath. Negative for cough, chest tightness, wheezing and stridor.   Cardiovascular: Negative for chest pain, palpitations and leg swelling.  Gastrointestinal: Negative for nausea, vomiting and  abdominal pain.  Endocrine: Negative for polyuria.  Genitourinary: Negative for dysuria, hematuria and flank pain.  Musculoskeletal: Positive for myalgias and arthralgias.  Skin: Negative for rash.  Neurological: Positive for headaches. Negative for dizziness, tremors, facial asymmetry, weakness and numbness.  Hematological: Positive for adenopathy.  Psychiatric/Behavioral: Negative for behavioral problems and confusion.    Allergies  Review of patient's allergies indicates no known allergies.  Home Medications   Current Outpatient Rx  Name  Route  Sig  Dispense  Refill  . acetaminophen (TYLENOL) 500 MG tablet   Oral   Take 1,000 mg by mouth every 6 (six) hours as needed for pain.         . benztropine (COGENTIN) 1 MG tablet   Oral   Take 1 mg by mouth 2 (two) times daily.         . hydrochlorothiazide (MICROZIDE) 12.5 MG capsule   Oral   Take 1 capsule (12.5 mg total) by mouth daily.   60 capsule   5   . OLANZapine (ZYPREXA) 15 MG tablet   Oral   Take 30 mg by mouth at bedtime. Take with 5mg  tablet for total daily dose of 35mg          . OLANZapine (ZYPREXA) 5 MG tablet   Oral   Take 5 mg by mouth at bedtime. Take with 2-15mg  tablets for total daily dose of 35mg           BP 139/89  Pulse 112  Temp(Src) 99.2 F (37.3 C) (Oral)  Resp 18  Ht 5\' 7"  (  1.702 m)  Wt 219 lb (99.338 kg)  BMI 34.29 kg/m2  SpO2 95% Physical Exam  Nursing note and vitals reviewed. Constitutional: He is oriented to person, place, and time. He appears well-developed and well-nourished. No distress.  HENT:  Head: Normocephalic and atraumatic.  Gingival erythema, no overt abscess.  He has poor dentition and multiple teeth have been removed.  There is swelling which is visible on the exterior anterolateral left mandible.  No fluctuance is noted.  He has tender left tonsillar adenopathy.  Eyes: Conjunctivae are normal. No scleral icterus.  Neck: Normal range of motion. Neck supple.   Cardiovascular: Regular rhythm and normal heart sounds.   Tachycardic  Pulmonary/Chest: Effort normal and breath sounds normal. No respiratory distress.  Abdominal: Soft. There is no tenderness.  Musculoskeletal: He exhibits no edema.  Neurological: He is alert and oriented to person, place, and time.  Skin: Skin is warm and dry. He is not diaphoretic.  Psychiatric: His behavior is normal.    ED Course  Procedures (including critical care time) Labs Review Labs Reviewed  CBC WITH DIFFERENTIAL - Abnormal; Notable for the following:    Platelets 114 (*)    Monocytes Relative 15 (*)    Monocytes Absolute 1.2 (*)    All other components within normal limits  COMPREHENSIVE METABOLIC PANEL - Abnormal; Notable for the following:    Glucose, Bld 106 (*)    Total Protein 8.9 (*)    Total Bilirubin 3.4 (*)    All other components within normal limits  CULTURE, BLOOD (ROUTINE X 2)  CULTURE, BLOOD (ROUTINE X 2)  URINE CULTURE  URINALYSIS, ROUTINE W REFLEX MICROSCOPIC  CG4 I-STAT (LACTIC ACID)   Imaging Review No results found.  MDM   1. Periapical abscess   2. SIRS (systemic inflammatory response syndrome)   3. Thrombocytopenia  BP 139/89  Pulse 112  Temp(Src) 99.2 F (37.3 C) (Oral)  Resp 18  Ht 5\' 7"  (1.702 m)  Wt 219 lb (99.338 kg)  BMI 34.29 kg/m2  SpO47 82% 52 year old male who presents the emergency department with fever of 102.7, tachycardia. Fits Sirs/sepsis Patient's lab work is pending.   Filed Vitals:   01/24/13 1032 01/24/13 1125 01/24/13 1238 01/24/13 1352  BP: 139/89  128/79 135/77  Pulse: 112     Temp:  99.2 F (37.3 C) 100.2 F (37.9 C) 98.9 F (37.2 C)  TempSrc:  Oral Oral Oral  Resp: 20 18 18 18   Height:      Weight:      SpO2: 95%  94% 94%   Patient with SIRS, Periapical abscess confirmed on CT. No lymph node involvement or concerns for possible recurrent lymphoma. Patient will be discharged home with amoxicillin.  He may use Tylenol or Advil  for fever and pain relief.  Patient has a dentist appointment this week and will follow closely with his dentist.  Discussed return precautions with the patient.  At this time all  emergency medical conditions have been stabilized and the patient appears ready for discharge with appropriate outpatient follow up.Diagnosis was discussed with patient who verbalizes understanding and is agreeable to discharge. Pt case discussed with Dr. Juleen China who agrees with my plan.     3:00 PM Patient discharged before urine was sent. Nurse Adrian Prows informed me of this oversight. She also states that the patien't urine appears bloody. I have sent patient's urine for analysis and will watch for results. Patient will be called if results are abnormal.  3:45  PM Patient urine with small amount hgb. Does not appear to be infected.  Patient will be contacted.  Arthor Captain, PA-C 01/24/13 1546

## 2013-01-24 NOTE — ED Notes (Signed)
Pt reports feeling lightheaded with standing. Denies dizziness.

## 2013-01-24 NOTE — ED Notes (Signed)
Pt unable to void at this time. 

## 2013-01-24 NOTE — ED Notes (Signed)
Patient contacedt and informed to follow up with PCP.

## 2013-01-24 NOTE — ED Notes (Addendum)
   1630- Pt left OTF due to waiting for results pt was discharged at 1500 by Adrian Prows RN. Pt left urine (not initially ordered) at bedside which was red in color. Melissa RN alerted [employer] who placed UA order for results. Pt left in system for results to come back. Abigail PA-C attempted to notify patient of results but did not reach patient. Tonya flow manager notified by THIS RN  To phone results and instructions given by Romie Levee. Pt taken out of system for original time discharged. 1505.

## 2013-01-25 ENCOUNTER — Encounter: Payer: Self-pay | Admitting: Internal Medicine

## 2013-01-25 ENCOUNTER — Ambulatory Visit (INDEPENDENT_AMBULATORY_CARE_PROVIDER_SITE_OTHER): Payer: Medicare Other | Admitting: Internal Medicine

## 2013-01-25 VITALS — BP 140/82 | HR 92 | Temp 98.2°F | Wt 218.1 lb

## 2013-01-25 DIAGNOSIS — R739 Hyperglycemia, unspecified: Secondary | ICD-10-CM

## 2013-01-25 DIAGNOSIS — R17 Unspecified jaundice: Secondary | ICD-10-CM

## 2013-01-25 DIAGNOSIS — R7309 Other abnormal glucose: Secondary | ICD-10-CM

## 2013-01-25 DIAGNOSIS — K047 Periapical abscess without sinus: Secondary | ICD-10-CM

## 2013-01-25 DIAGNOSIS — R220 Localized swelling, mass and lump, head: Secondary | ICD-10-CM

## 2013-01-25 DIAGNOSIS — Z23 Encounter for immunization: Secondary | ICD-10-CM

## 2013-01-25 DIAGNOSIS — C8589 Other specified types of non-Hodgkin lymphoma, extranodal and solid organ sites: Secondary | ICD-10-CM

## 2013-01-25 DIAGNOSIS — I10 Essential (primary) hypertension: Secondary | ICD-10-CM

## 2013-01-25 LAB — COMPLETE METABOLIC PANEL WITH GFR
ALT: 26 U/L (ref 0–53)
AST: 32 U/L (ref 0–37)
Albumin: 3.7 g/dL (ref 3.5–5.2)
Alkaline Phosphatase: 58 U/L (ref 39–117)
BUN: 17 mg/dL (ref 6–23)
CO2: 24 mEq/L (ref 19–32)
Calcium: 9.4 mg/dL (ref 8.4–10.5)
Chloride: 104 mEq/L (ref 96–112)
Creat: 0.85 mg/dL (ref 0.50–1.35)
GFR, Est African American: >89 mL/min
GFR, Est Non African American: >89 mL/min
Glucose, Bld: 96 mg/dL (ref 70–99)
Potassium: 3.9 mEq/L (ref 3.5–5.3)
Sodium: 138 mEq/L (ref 135–145)
Total Bilirubin: 1.7 mg/dL — ABNORMAL HIGH (ref 0.3–1.2)
Total Protein: 7.6 g/dL (ref 6.0–8.3)

## 2013-01-25 LAB — POCT GLYCOSYLATED HEMOGLOBIN (HGB A1C): Hemoglobin A1C: 4.7

## 2013-01-25 LAB — GLUCOSE, CAPILLARY: Glucose-Capillary: 95 mg/dL (ref 70–99)

## 2013-01-25 MED ORDER — IBUPROFEN 200 MG PO TABS: 200.0000 mg | ORAL_TABLET | Freq: Four times a day (QID) | ORAL | Status: AC | PRN

## 2013-01-25 NOTE — Assessment & Plan Note (Signed)
BP Readings from Last 3 Encounters:  01/25/13 140/82  01/24/13 139/85  01/19/13 157/91    Lab Results  Component Value Date   NA 136 01/24/2013   K 4.3 01/24/2013   CREATININE 0.97 01/24/2013    Assessment: Blood pressure control: controlled Progress toward BP goal:  at goal Comments: He is on HCTZ 12.5mg  daily.   Plan: Medications:  continue current medications Educational resources provided: brochure Self management tools provided: home blood pressure logbook Other plans: His BP may increase mildly with Ibuprofen tx but the benefit of this medication v Tyelnol outweighs this risk. Pt to follow up next month for BP recheck.

## 2013-01-25 NOTE — Patient Instructions (Addendum)
General Instructions: -Stop taking Tylenol--this is because your liver enzymes were elevated.  -Start taking Ibuprofen 200mg  every 4 hours as needed for pain. This medicine may raise your blood pressure so take it only if you need it.  -Call and make an appointment with your dentist as soon as possible.  -Follow up with Dr. Yetta Barre next month.  -Go to the ED if you have persistent fever/chills, nausea vomiting, diarrhea, difficulty swallowing or difficulty breathing.    Treatment Goals:  Goals (1 Years of Data) as of 01/25/13         As of Today 01/24/13 01/24/13 01/24/13 01/24/13     Blood Pressure    . Blood Pressure < 140/90  140/82 139/85 135/85 135/77 128/79      Progress Toward Treatment Goals:  Treatment Goal 01/25/2013  Blood pressure at goal    Self Care Goals & Plans:  Self Care Goal 01/25/2013  Manage my medications take my medicines as prescribed; bring my medications to every visit; refill my medications on time  Monitor my health keep track of my blood pressure  Eat healthy foods eat foods that are low in salt; eat baked foods instead of fried foods  Be physically active park at the far end of the parking lot       Care Management & Community Referrals:  Referral 01/25/2013  Referrals made for care management support none needed

## 2013-01-25 NOTE — Progress Notes (Signed)
  Subjective:    Patient ID: [name], male    DOB: Oct 25, 1960, 52 y.o.   MRN: [id number]  Hypertension Pertinent negatives include no chest pain, headaches, neck pain, palpitations or shortness of breath.   [name] is a 52 year old man with PMH of non-Hodgkin's lymphoma, HTN, hepatitis C, and schizophrenia who presents for ED follow up visit. He was seen in the ED yesterday for a tooth abscess with left jaw pain and was given prescription for amoxicillin and Tylenol as needed for pain. He started taking the amoxicillin this morning and denies fever since last night.     Review of Systems  Constitutional: Negative for fever, chills, diaphoresis, activity change, appetite change and fatigue.  HENT: Positive for facial swelling. Negative for congestion, sore throat, trouble swallowing, neck pain, neck stiffness and voice change.   Respiratory: Negative for cough, shortness of breath and wheezing.   Cardiovascular: Negative for chest pain, palpitations and leg swelling.  Gastrointestinal: Negative for nausea, vomiting and abdominal pain.  Genitourinary: Negative for dysuria, urgency, frequency, flank pain, discharge and difficulty urinating.  Musculoskeletal: Negative for back pain.  Skin: Negative for color change, pallor, rash and wound.  Neurological: Negative for dizziness, light-headedness and headaches.  Hematological: Positive for adenopathy.       Right anterior cervical lymphadenopathy   Psychiatric/Behavioral: Negative for behavioral problems and agitation.       Objective:   Physical Exam  Nursing note and vitals reviewed. Constitutional: He is oriented to person, place, and time. He appears well-developed and well-nourished. No distress.  HENT:  Swelling in the left lower jaw with 2cmx2cm area of swelling but no fluctuance, no identified gum injury, left lower teeth non tender to percussion. Missing some teeth.  Eyes: Conjunctivae are normal. No scleral icterus.   Wearing glasses  Neck: Normal range of motion. Neck supple. No thyromegaly present.  Left anterior cervical "shotty" lymph node.   Cardiovascular: Normal rate and regular rhythm.   Pulmonary/Chest: Effort normal and breath sounds normal. No respiratory distress. He has no wheezes. He has no rales.  Abdominal: Soft.  Musculoskeletal: He exhibits no edema and no tenderness.  Lymphadenopathy:    He has cervical adenopathy.  Neurological: He is alert and oriented to person, place, and time.  Skin: Skin is warm and dry. No rash noted. He is not diaphoretic. No erythema. No pallor.  Psychiatric: He has a normal mood and affect. His behavior is normal.          Assessment & Plan:

## 2013-01-25 NOTE — Assessment & Plan Note (Addendum)
He has left anterior cervical adenopathy which is likely reactive adenopathy due to his current tooth abscess but needs to be monitored during his next visits.

## 2013-01-26 DIAGNOSIS — R17 Unspecified jaundice: Secondary | ICD-10-CM | POA: Insufficient documentation

## 2013-01-26 DIAGNOSIS — K047 Periapical abscess without sinus: Secondary | ICD-10-CM | POA: Insufficient documentation

## 2013-01-26 DIAGNOSIS — R220 Localized swelling, mass and lump, head: Secondary | ICD-10-CM | POA: Insufficient documentation

## 2013-01-26 LAB — URINE CULTURE
Colony Count: NO GROWTH
Culture: NO GROWTH

## 2013-01-26 NOTE — Assessment & Plan Note (Addendum)
He states that his fever subsided after he started taking amoxicillin. His pain is well controlled with Tylenol but I will discontinue this medication given his elevated bilirubin; he agrees to take Ibuprofen PRN for pain instead. No fluctuant abscess per physical exam with no indication for I&D at this time, most likely soft tissue swelling of the lower left cheek 2/2 to tooth abscess with reactive cervical adenopathy.  -Encouraged pt to follow up with his Dentist--provided pt with a copy of the CT head/maxillary to be given to his Dentist.  -Pt encouraged to finish his antibiotic tx and to follow up with [place] if his pain and swelling worsen -Pt advised to go to the ED if he has persistent fever/chills, nausea or vomiting, or shortness of breath.

## 2013-01-26 NOTE — Assessment & Plan Note (Addendum)
On 10/1, total bilirubin elevated to 3.4 but trended down to 1.7 on 10/2. He has had persistently elevated bilirubin with CT abdomen/pelvis on 06/20/12 with no evidence of pancreatic mass, gallbladder disease, or liver cirrhosis.  Of note, he also has elevate protein gap which may reflect appropriate physiological response to his current tooth abscess but a repeat CMP during his next visit (once his tooth abscess is resolved) is warranted for evaluation of possible malignant processes, especially with his history of Non Hodgkin's lymphoma and chemo tx.

## 2013-01-28 NOTE — ED Provider Notes (Signed)
Medical screening examination/treatment/procedure(s) were performed by non-physician practitioner and as supervising physician I was immediately available for consultation/collaboration.  Roverto Bodmer, MD 01/28/13 1209 

## 2013-01-29 NOTE — Progress Notes (Signed)
Case discussed with Dr. Kennerly soon after the resident saw the patient.  We reviewed the resident's history and exam and pertinent patient test results.  I agree with the assessment, diagnosis and plan of care documented in the resident's note. 

## 2013-01-30 LAB — CULTURE, BLOOD (ROUTINE X 2)
Culture: NO GROWTH
Culture: NO GROWTH

## 2013-02-23 DIAGNOSIS — D485 Neoplasm of uncertain behavior of skin: Secondary | ICD-10-CM | POA: Diagnosis not present

## 2013-02-23 DIAGNOSIS — L259 Unspecified contact dermatitis, unspecified cause: Secondary | ICD-10-CM | POA: Diagnosis not present

## 2013-03-13 ENCOUNTER — Encounter: Payer: Self-pay | Admitting: Internal Medicine

## 2013-03-13 ENCOUNTER — Ambulatory Visit (INDEPENDENT_AMBULATORY_CARE_PROVIDER_SITE_OTHER): Payer: Medicare Other | Admitting: Internal Medicine

## 2013-03-13 VITALS — BP 143/84 | HR 68 | Temp 97.3°F | Ht 66.0 in | Wt 224.0 lb

## 2013-03-13 DIAGNOSIS — C8589 Other specified types of non-Hodgkin lymphoma, extranodal and solid organ sites: Secondary | ICD-10-CM | POA: Diagnosis not present

## 2013-03-13 DIAGNOSIS — Z23 Encounter for immunization: Secondary | ICD-10-CM | POA: Diagnosis not present

## 2013-03-13 DIAGNOSIS — R17 Unspecified jaundice: Secondary | ICD-10-CM | POA: Diagnosis not present

## 2013-03-13 DIAGNOSIS — I10 Essential (primary) hypertension: Secondary | ICD-10-CM | POA: Diagnosis not present

## 2013-03-13 DIAGNOSIS — K047 Periapical abscess without sinus: Secondary | ICD-10-CM | POA: Diagnosis not present

## 2013-03-13 DIAGNOSIS — L989 Disorder of the skin and subcutaneous tissue, unspecified: Secondary | ICD-10-CM

## 2013-03-13 DIAGNOSIS — R7309 Other abnormal glucose: Secondary | ICD-10-CM | POA: Diagnosis not present

## 2013-03-13 DIAGNOSIS — R220 Localized swelling, mass and lump, head: Secondary | ICD-10-CM | POA: Diagnosis not present

## 2013-03-13 LAB — COMPREHENSIVE METABOLIC PANEL
ALT: 33 U/L (ref 0–53)
AST: 37 U/L (ref 0–37)
Albumin: 3.9 g/dL (ref 3.5–5.2)
Alkaline Phosphatase: 93 U/L (ref 39–117)
BUN: 8 mg/dL (ref 6–23)
CO2: 31 mEq/L (ref 19–32)
Calcium: 9.7 mg/dL (ref 8.4–10.5)
Chloride: 104 mEq/L (ref 96–112)
Creat: 1 mg/dL (ref 0.50–1.35)
Glucose, Bld: 94 mg/dL (ref 70–99)
Potassium: 4.3 mEq/L (ref 3.5–5.3)
Sodium: 141 mEq/L (ref 135–145)
Total Bilirubin: 1.4 mg/dL — ABNORMAL HIGH (ref 0.3–1.2)
Total Protein: 7.5 g/dL (ref 6.0–8.3)

## 2013-03-13 NOTE — Progress Notes (Signed)
Subjective:   Patient ID: [name] male   DOB: 1960-05-27 52 y.o.   MRN: [id number]  HPI: [name] is a 52 y.o. M w/ PMHx of Non-Hodgkins Lymphoma (now in remission), HTN, Hep C, and Schizophrenia, presents to the clinic today for follow up. He was last seen in the clinic on 01/26/13 for a dental abscess which has since resolved. Was found to have an elevated bilirubin at that time as well.  On 12/26/12, the patient came to clinic for the first time in 2 years and was found to have high blood pressure and was started on HCTZ 12.5 mg po qd at that time. Since then he has had no issues with his medications. He was also complaining of several skin lesions, mostly on UE's, that appeared to be wart-like lesions. Has since seen dermatology for these and has had them burned off since his last visit.  Otherwise, the patient denies any further issues. He follows up with mental health regularly for his schizophrenia and also sees his oncologist yearly for his previous history of Non-Hodgkins Lymphoma.  Past Medical History  Diagnosis Date  . Hepatitis C     not treatable  . Hypertension   . Schizophrenia   . Hemorrhoids   . Rectal bleeding   . Abdominal pain   . Non Hodgkin's lymphoma   . Cancer     lymphoma   Current Outpatient Prescriptions  Medication Sig Dispense Refill  . amoxicillin (AMOXIL) 500 MG capsule Take 1 capsule (500 mg total) by mouth 3 (three) times daily.  21 capsule  0  . benztropine (COGENTIN) 1 MG tablet Take 1 mg by mouth 2 (two) times daily.      . hydrochlorothiazide (MICROZIDE) 12.5 MG capsule Take 1 capsule (12.5 mg total) by mouth daily.  60 capsule  5  . ibuprofen (ADVIL) 200 MG tablet Take 1 tablet (200 mg total) by mouth every 6 (six) hours as needed for pain.  100 tablet  2  . OLANZapine (ZYPREXA) 15 MG tablet Take 30 mg by mouth at bedtime. Take with 5mg  tablet for total daily dose of 35mg       . OLANZapine (ZYPREXA) 5 MG tablet Take 5 mg by mouth at bedtime.  Take with 2-15mg  tablets for total daily dose of 35mg        No current facility-administered medications for this visit.   No family history on file. History   Social History  . Marital Status: Single    Spouse Name: N/A    Number of Children: N/A  . Years of Education: N/A   Social History Main Topics  . Smoking status: Former Games developer  . Smokeless tobacco: None  . Alcohol Use: No  . Drug Use: No  . Sexual Activity: None   Other Topics Concern  . None   Social History Narrative  . None   Review of Systems: General: Denies fever, chills, diaphoresis, appetite change and fatigue.  Respiratory: Denies SOB, DOE, cough, chest tightness, and wheezing.   Cardiovascular: Denies chest pain, palpitations and leg swelling.  Gastrointestinal: Denies nausea, vomiting, abdominal pain, diarrhea, constipation, blood in stool and abdominal distention.  Musculoskeletal: Denies myalgias, back pain, joint swelling, arthralgias and gait problem.  Skin: Denies pallor, rash and wounds.  Neurological: Denies dizziness, seizures, syncope, weakness, lightheadedness, numbness and headaches.  Psychiatric/Behavioral: Denies mood changes, confusion, nervousness, sleep disturbance and agitation.  Objective:  Physical Exam: Filed Vitals:   03/13/13 1556  BP: 143/84  Pulse: 68  Temp: 97.3 F (36.3 C)  TempSrc: Oral  Height: 5\' 6"  (1.676 m)  Weight: 224 lb (101.606 kg)  SpO2: 98%   General: Vital signs reviewed.  Patient is a well-developed and well-nourished, in no acute distress and cooperative with exam. Alert and oriented x3.  Head: Normocephalic and atraumatic. Eyes: PERRL, EOMI, conjunctivae normal, No scleral icterus.  Neck: Supple, trachea midline, normal ROM, No JVD, masses, thyromegaly, or carotid bruit present.  Cardiovascular: RRR, S1 normal, S2 normal, no murmurs, gallops, or rubs. Pulmonary/Chest: Normal respiratory effort, CTAB, no wheezes, rales, or rhonchi. Abdominal: Soft,  non-tender, non-distended, bowel sounds are normal, no masses, organomegaly, or guarding present.  Musculoskeletal: No joint deformities, erythema, or stiffness, ROM full and no nontender. Extremities: No swelling or edema,  pulses symmetric and intact bilaterally. No cyanosis or clubbing. Neurological: A&O x3, Strength is normal and symmetric bilaterally, cranial nerve II-XII are grossly intact, no focal motor deficit, sensory intact to light touch bilaterally.  Skin: Multiple burned off wart-like lesions on UE's. No surrounding erythema, tenderness, or exudate. Warm, dry and intact. No rashes or erythema. Psychiatric: Normal mood and affect. speech and behavior is normal. Cognition and memory are normal.   Assessment & Plan:   Please see problem-based assessment and plan.

## 2013-03-13 NOTE — Patient Instructions (Signed)
1. Please schedule a follow up appointment for 2-3 months  2. Please take all medications as prescribed.   3. If you have worsening of your symptoms or new symptoms arise, please call the clinic ([phone]), or go to the ER immediately if symptoms are severe.  You have done a great job in taking all your medications. I appreciate it very much. Please continue doing that.  

## 2013-03-14 NOTE — Assessment & Plan Note (Signed)
Was referred to dermatology in September for wart-like lesions, has had them burned off since then. No issues.

## 2013-03-14 NOTE — Assessment & Plan Note (Signed)
Shown to have elevated bili at previous visit. Trend as follows: 3.4-->1.7-->1.4 today. Trending down. Will re-check at next visit. If still elevated, will consider further workup for hyperbilirubinemia.

## 2013-03-14 NOTE — Progress Notes (Signed)
I saw and evaluated the patient.  I personally confirmed the key portions of the history and exam documented by Dr. Jones and I reviewed pertinent patient test results.  The assessment, diagnosis, and plan were formulated together and I agree with the documentation in the resident's note.   

## 2013-03-14 NOTE — Assessment & Plan Note (Signed)
Saw oral surgery after last visit, was given ABx, abscess completely resolved.

## 2013-03-14 NOTE — Assessment & Plan Note (Signed)
BP 143/84 today. Still on HCTZ 12.5 mg po qd.  -Will keep at same dose for now, consider increase to 25 mg po qd if still elevated to 140's-150's.  -Instructed patient to check at [facility] 2-3x before next visit to determine general baseline on medication.

## 2013-05-15 DIAGNOSIS — F209 Schizophrenia, unspecified: Secondary | ICD-10-CM | POA: Diagnosis not present

## 2013-06-12 ENCOUNTER — Encounter: Payer: Medicare Other | Admitting: Internal Medicine

## 2013-06-26 ENCOUNTER — Ambulatory Visit (HOSPITAL_COMMUNITY)
Admission: RE | Admit: 2013-06-26 | Discharge: 2013-06-26 | Disposition: A | Discharge status: Home / Self Care | Payer: Medicare Other | Source: Ambulatory Visit | Attending: Internal Medicine | Admitting: Internal Medicine

## 2013-06-26 ENCOUNTER — Encounter (HOSPITAL_COMMUNITY): Payer: Self-pay

## 2013-06-26 ENCOUNTER — Ambulatory Visit: Payer: Medicare Other | Admitting: Internal Medicine

## 2013-06-26 ENCOUNTER — Other Ambulatory Visit (HOSPITAL_BASED_OUTPATIENT_CLINIC_OR_DEPARTMENT_OTHER): Payer: Medicare Other

## 2013-06-26 DIAGNOSIS — C8589 Other specified types of non-Hodgkin lymphoma, extranodal and solid organ sites: Secondary | ICD-10-CM | POA: Insufficient documentation

## 2013-06-26 DIAGNOSIS — I868 Varicose veins of other specified sites: Secondary | ICD-10-CM | POA: Insufficient documentation

## 2013-06-26 DIAGNOSIS — Z9221 Personal history of antineoplastic chemotherapy: Secondary | ICD-10-CM | POA: Diagnosis not present

## 2013-06-26 DIAGNOSIS — B1920 Unspecified viral hepatitis C without hepatic coma: Secondary | ICD-10-CM | POA: Diagnosis not present

## 2013-06-26 DIAGNOSIS — K7460 Unspecified cirrhosis of liver: Secondary | ICD-10-CM | POA: Diagnosis not present

## 2013-06-26 DIAGNOSIS — R911 Solitary pulmonary nodule: Secondary | ICD-10-CM | POA: Diagnosis not present

## 2013-06-26 LAB — CBC WITH DIFFERENTIAL/PLATELET
BASO%: 0.6 % (ref 0.0–2.0)
Basophils Absolute: 0 10*3/uL (ref 0.0–0.1)
EOS%: 0.8 % (ref 0.0–7.0)
Eosinophils Absolute: 0 10*3/uL (ref 0.0–0.5)
HCT: 43.5 % (ref 38.4–49.9)
HGB: 14.1 g/dL (ref 13.0–17.1)
LYMPH%: 19.5 % (ref 14.0–49.0)
MCH: 26.5 pg — ABNORMAL LOW (ref 27.2–33.4)
MCHC: 32.4 g/dL (ref 32.0–36.0)
MCV: 81.9 fL (ref 79.3–98.0)
MONO#: 0.7 10*3/uL (ref 0.1–0.9)
MONO%: 13.3 % (ref 0.0–14.0)
NEUT#: 3.6 10*3/uL (ref 1.5–6.5)
NEUT%: 65.8 % (ref 39.0–75.0)
Platelets: 115 10*3/uL — ABNORMAL LOW (ref 140–400)
RBC: 5.31 10*6/uL (ref 4.20–5.82)
RDW: 13.3 % (ref 11.0–14.6)
WBC: 5.5 10*3/uL (ref 4.0–10.3)
lymph#: 1.1 10*3/uL (ref 0.9–3.3)

## 2013-06-26 LAB — COMPREHENSIVE METABOLIC PANEL (CC13)
ALT: 30 U/L (ref 0–55)
AST: 35 U/L — ABNORMAL HIGH (ref 5–34)
Albumin: 3.8 g/dL (ref 3.5–5.0)
Alkaline Phosphatase: 90 U/L (ref 40–150)
Anion Gap: 9 mEq/L (ref 3–11)
BUN: 10.5 mg/dL (ref 7.0–26.0)
CO2: 27 mEq/L (ref 22–29)
Calcium: 9.9 mg/dL (ref 8.4–10.4)
Chloride: 105 mEq/L (ref 98–109)
Creatinine: 1 mg/dL (ref 0.7–1.3)
Glucose: 119 mg/dl (ref 70–140)
Potassium: 4.1 mEq/L (ref 3.5–5.1)
Sodium: 141 mEq/L (ref 136–145)
Total Bilirubin: 1.72 mg/dL — ABNORMAL HIGH (ref 0.20–1.20)
Total Protein: 8.4 g/dL — ABNORMAL HIGH (ref 6.4–8.3)

## 2013-06-26 LAB — LACTATE DEHYDROGENASE (CC13): LDH: 161 U/L (ref 125–245)

## 2013-06-26 MED ORDER — IOHEXOL 300 MG/ML  SOLN
100.0000 mL | Freq: Once | INTRAMUSCULAR | Status: AC | PRN
  Administered 2013-06-26: 100 mL via INTRAVENOUS

## 2013-06-27 ENCOUNTER — Telehealth: Payer: Self-pay | Admitting: *Deleted

## 2013-06-27 ENCOUNTER — Encounter: Payer: Self-pay | Admitting: Internal Medicine

## 2013-06-27 ENCOUNTER — Ambulatory Visit (HOSPITAL_BASED_OUTPATIENT_CLINIC_OR_DEPARTMENT_OTHER): Payer: Medicare Other | Admitting: Internal Medicine

## 2013-06-27 VITALS — BP 130/79 | HR 100 | Temp 99.4°F | Resp 18 | Ht 66.0 in | Wt 216.8 lb

## 2013-06-27 DIAGNOSIS — Z87898 Personal history of other specified conditions: Secondary | ICD-10-CM

## 2013-06-27 DIAGNOSIS — C8589 Other specified types of non-Hodgkin lymphoma, extranodal and solid organ sites: Secondary | ICD-10-CM

## 2013-06-27 NOTE — Progress Notes (Signed)
[place] [phone]   [phone]  OFFICE PROGRESS NOTE  PRINCIPAL DIAGNOSIS: Stage IV non-Hodgkin lymphoma diagnosed in March 2009.   PRIOR THERAPY:  1. Status post 4 weekly doses of Rituxan during the patient's hospitalization at the intensive care unit at [facility] with respiratory failure. 2. Status post 7 cycles of systemic chemotherapy with CHOP/Rituxan. Last dose was given January 15, 2008.  CURRENT THERAPY: Observation.  INTERVAL HISTORY: [name] 53 y.o. male returns to the clinic today for annual followup visit accompanied by his sister. The patient is feeling fine today with no specific complaints. He denied having any significant weight loss or night sweats. He has no palpable lymphadenopathy. He denied having any significant chest pain, shortness of breath, cough or hemoptysis. The patient had repeat CT scan of the chest, abdomen and pelvis performed recently and he is here for evaluation and discussion of his scan results.  MEDICAL HISTORY: Past Medical History  Diagnosis Date  . Hepatitis C     not treatable  . Hypertension   . Schizophrenia   . Hemorrhoids   . Rectal bleeding   . Abdominal pain   . Non Hodgkin's lymphoma   . Cancer     lymphoma    ALLERGIES:  has No Known Allergies.  MEDICATIONS:  Current Outpatient Prescriptions  Medication Sig Dispense Refill  . benztropine (COGENTIN) 1 MG tablet Take 1 mg by mouth 2 (two) times daily.      . hydrochlorothiazide (MICROZIDE) 12.5 MG capsule Take 1 capsule (12.5 mg total) by mouth daily.  60 capsule  5  . ibuprofen (ADVIL) 200 MG tablet Take 1 tablet (200 mg total) by mouth every 6 (six) hours as needed for pain.  100 tablet  2  . OLANZapine (ZYPREXA) 20 MG tablet Take 40 mg by mouth at bedtime.       No current facility-administered medications for this visit.    REVIEW OF SYSTEMS:  A comprehensive review of systems was negative.   PHYSICAL  EXAMINATION: General appearance: alert, cooperative and no distress Head: Normocephalic, without obvious abnormality, atraumatic Neck: no adenopathy Lymph nodes: Cervical, supraclavicular, and axillary nodes normal. Resp: clear to auscultation bilaterally Cardio: regular rate and rhythm, S1, S2 normal, no murmur, click, rub or gallop GI: soft, non-tender; bowel sounds normal; no masses,  no organomegaly Extremities: extremities normal, atraumatic, no cyanosis or edema  ECOG PERFORMANCE STATUS: 0 - Asymptomatic  Blood pressure 130/79, pulse 100, temperature 99.4 F (37.4 C), temperature source Oral, resp. rate 18, height 5\' 6"  (1.676 m), weight 216 lb 12.8 oz (98.34 kg), SpO2 98.00%.  LABORATORY DATA: Lab Results  Component Value Date   WBC 5.5 06/26/2013   HGB 14.1 06/26/2013   HCT 43.5 06/26/2013   MCV 81.9 06/26/2013   PLT 115* 06/26/2013      Chemistry      Component Value Date/Time   NA 141 06/26/2013 0933   NA 141 03/13/2013 1627   NA 139 12/18/2010 1219   K 4.1 06/26/2013 0933   K 4.3 03/13/2013 1627   K 4.5 12/18/2010 1219   CL 104 03/13/2013 1627   CL 107 06/20/2012 1032   CL 103 12/18/2010 1219   CO2 27 06/26/2013 0933   CO2 31 03/13/2013 1627   CO2 26 12/18/2010 1219   BUN 10.5 06/26/2013 0933   BUN 8 03/13/2013 1627   BUN 9 12/18/2010 1219   CREATININE 1.0 06/26/2013 0933  CREATININE 1.00 03/13/2013 1627   CREATININE 0.97 01/24/2013 1145      Component Value Date/Time   CALCIUM 9.9 06/26/2013 0933   CALCIUM 9.7 03/13/2013 1627   CALCIUM 8.8 12/18/2010 1219   ALKPHOS 90 06/26/2013 0933   ALKPHOS 93 03/13/2013 1627   ALKPHOS 104* 12/18/2010 1219   AST 35* 06/26/2013 0933   AST 37 03/13/2013 1627   AST 145* 12/18/2010 1219   ALT 30 06/26/2013 0933   ALT 33 03/13/2013 1627   ALT 82* 12/18/2010 1219   BILITOT 1.72* 06/26/2013 0933   BILITOT 1.4* 03/13/2013 1627   BILITOT 2.40* 12/18/2010 1219       RADIOGRAPHIC STUDIES: Ct Chest W Contrast  06/26/2013   CLINICAL DATA:  Non-Hodgkin's  lymphoma diagnosed 2009, chemotherapy complete, for staging. History of hepatitis C.  EXAM: CT CHEST, ABDOMEN, AND PELVIS WITH CONTRAST  TECHNIQUE: Multidetector CT imaging of the chest, abdomen and pelvis was performed following the standard protocol during bolus administration of intravenous contrast.  CONTRAST:  155mL OMNIPAQUE IOHEXOL 300 MG/ML  SOLN  COMPARISON:  06/20/2012  FINDINGS:   CT CHEST FINDINGS  2 mm subpleural nodule in the right lower lobe (series 5/image 33), unchanged since 12/20/2011, benign. No suspicious pulmonary nodules. Mild scarring in the lateral left lower lobe/ lingula (series 5/image 37) mild emphysematous changes at the lung apices. No pleural effusion or pneumothorax.  Visualized thyroid is unremarkable.  The heart is normal in size.  No pericardial effusion.  No suspicious mediastinal, hilar, or axillary lymphadenopathy.  Visualized osseous structures are within normal limits.    CT ABDOMEN AND PELVIS FINDINGS  Nodular hepatic contour, reflecting cirrhosis. No suspicious/enhancing hepatic lesions.  Splenomegaly, measuring 13.9 x 6.1 x 13.8 cm (calculated volume 612 mL), grossly unchanged. Mildly heterogeneous appearance, particularly inferiorly (series 2/images 62-63), although without discrete/dominant mass.  Portal vein is patent. Small perigastric/perisplenic varices (series 2/ image 68). Recannulated periumbilical vein.  Pancreas and adrenal glands are within normal limits.  Gallbladder is unremarkable. No intrahepatic or extrahepatic ductal dilatation.  Kidneys are within normal limits.  No hydronephrosis.  No evidence of bowel obstruction.  Normal appendix.  No evidence of abdominal aortic aneurysm.  No abdominopelvic ascites.  2.7 x 1.6 cm soft tissue lesion in the left inguinal region (series 2/ image 121), presumed to reflect a lymph node, although unchanged across numerous prior CTs and non FDG avid. Regardless, this appearance is benign.  No suspicious abdominopelvic  lymphadenopathy.  Prostate is unremarkable.  Bladder is within normal limits.  Very mild degenerative changes of the lumbar spine.    IMPRESSION: Splenomegaly, measuring up to 13.9 cm (calculated volume 612 mL), grossly unchanged. It is unclear whether this reflects portal venous hypertension (favored) or is related to history of lymphoma.  Otherwise, no evidence of lymphomatous involvement in the chest, abdomen, or pelvis.  Cirrhosis with small upper abdominal varices. No abdominopelvic ascites.   Electronically Signed   By: Julian Hy M.D.   On: 06/26/2013 12:32    ASSESSMENT AND PLAN: This is a very pleasant 53 years old [place] American male with history of stage IV non-Hodgkin lymphoma diagnosed in March of 2009 status post systemic chemotherapy and has been observation since September of 2009 with no evidence for disease recurrence. I discussed the scan results with the patient today. I recommended for him to continue on observation with repeat CT scan of the chest, abdomen and pelvis in one year. The patient was advised to call me immediately if he  has any concerning symptoms in the interval.  All questions were answered. The patient knows to call the clinic with any problems, questions or concerns. We can certainly see the patient much sooner if necessary.  Disclaimer: This note was dictated with voice recognition software. Similar sounding words can inadvertently be transcribed and may not be corrected upon review.

## 2013-06-27 NOTE — Telephone Encounter (Signed)
appts made and printed. Pt is aware that cs will call w/ appt for CT abd/pelvis/ chest...td 

## 2013-06-28 DIAGNOSIS — F209 Schizophrenia, unspecified: Secondary | ICD-10-CM | POA: Diagnosis not present

## 2013-08-06 ENCOUNTER — Encounter: Payer: Self-pay | Admitting: Internal Medicine

## 2013-08-06 ENCOUNTER — Ambulatory Visit (INDEPENDENT_AMBULATORY_CARE_PROVIDER_SITE_OTHER): Payer: Medicare Other | Admitting: Internal Medicine

## 2013-08-06 VITALS — BP 139/83 | HR 67 | Temp 98.0°F | Ht 65.5 in | Wt 219.0 lb

## 2013-08-06 DIAGNOSIS — I10 Essential (primary) hypertension: Secondary | ICD-10-CM | POA: Diagnosis not present

## 2013-08-06 DIAGNOSIS — R220 Localized swelling, mass and lump, head: Secondary | ICD-10-CM | POA: Diagnosis not present

## 2013-08-06 DIAGNOSIS — R7309 Other abnormal glucose: Secondary | ICD-10-CM | POA: Diagnosis not present

## 2013-08-06 DIAGNOSIS — B1920 Unspecified viral hepatitis C without hepatic coma: Secondary | ICD-10-CM | POA: Diagnosis not present

## 2013-08-06 DIAGNOSIS — L678 Other hair color and hair shaft abnormalities: Secondary | ICD-10-CM | POA: Diagnosis not present

## 2013-08-06 DIAGNOSIS — L739 Follicular disorder, unspecified: Secondary | ICD-10-CM

## 2013-08-06 DIAGNOSIS — C8589 Other specified types of non-Hodgkin lymphoma, extranodal and solid organ sites: Secondary | ICD-10-CM | POA: Diagnosis not present

## 2013-08-06 DIAGNOSIS — R17 Unspecified jaundice: Secondary | ICD-10-CM | POA: Diagnosis not present

## 2013-08-06 DIAGNOSIS — L738 Other specified follicular disorders: Secondary | ICD-10-CM

## 2013-08-06 DIAGNOSIS — B1710 Acute hepatitis C without hepatic coma: Secondary | ICD-10-CM

## 2013-08-06 DIAGNOSIS — K047 Periapical abscess without sinus: Secondary | ICD-10-CM | POA: Diagnosis not present

## 2013-08-06 DIAGNOSIS — L989 Disorder of the skin and subcutaneous tissue, unspecified: Secondary | ICD-10-CM | POA: Diagnosis not present

## 2013-08-06 DIAGNOSIS — Z23 Encounter for immunization: Secondary | ICD-10-CM | POA: Diagnosis not present

## 2013-08-06 DIAGNOSIS — L729 Follicular cyst of the skin and subcutaneous tissue, unspecified: Principal | ICD-10-CM

## 2013-08-06 NOTE — Patient Instructions (Signed)
1. Please schedule a follow up appointment for 3-4 months.  2. Please take all medications as prescribed.   3. If you have worsening of your symptoms or new symptoms arise, please call the clinic ([phone]), or go to the ER immediately if symptoms are severe.  You have done a great job in taking all your medications. I appreciate it very much. Please continue doing that.

## 2013-08-06 NOTE — Progress Notes (Signed)
Patient ID: [name], male   DOB: 1960/05/31, 53 y.o.   MRN: [id number]  Subjective:   Patient ID: [name] male   DOB: March 29, 1961 53 y.o.   MRN: [id number]  HPI: [name] is a 53 y.o. M w/ PMHx of Non-Hodgkins Lymphoma (now in remission), HTN, Hep C, and Schizophrenia, presents to the clinic today for only mild complaints. He is most concerned with a small bump inside his left nare that started a couple days ago after he plucked a nose hair. He claims the area is slightly tender to the touch now and feels a bit swollen. He denies fever, chills, malaise, or nausea. He denies discharge or bleeding from the site.  Otherwise, [name] has no complaints. He says he has been walking every day, trying to lift weights and exercise regularly. He has been compliant with all of his medications. No further issues. He recently followed w/ his oncologist for his lymphoma yearly visit (now in remission) and a CT chest/abd/pelv was performed at that time, showing cirrhosis w/ small upper abdominal varices, but with no abdominopelvic ascites. Also w/ splenomegaly, questioning portal venous HTN.  Past Medical History  Diagnosis Date  . Hepatitis C     not treatable  . Hypertension   . Schizophrenia   . Hemorrhoids   . Rectal bleeding   . Abdominal pain   . Non Hodgkin's lymphoma   . Cancer     lymphoma   Current Outpatient Prescriptions  Medication Sig Dispense Refill  . benztropine (COGENTIN) 1 MG tablet Take 1 mg by mouth 2 (two) times daily.      . hydrochlorothiazide (MICROZIDE) 12.5 MG capsule Take 1 capsule (12.5 mg total) by mouth daily.  60 capsule  5  . ibuprofen (ADVIL) 200 MG tablet Take 1 tablet (200 mg total) by mouth every 6 (six) hours as needed for pain.  100 tablet  2  . OLANZapine (ZYPREXA) 20 MG tablet Take 40 mg by mouth at bedtime.       No current facility-administered medications for this visit.   No family history on file. History   Social History  . Marital  Status: Single    Spouse Name: N/A    Number of Children: N/A  . Years of Education: N/A   Social History Main Topics  . Smoking status: Former Smoker    Quit date: 04/26/1988  . Smokeless tobacco: None  . Alcohol Use: No  . Drug Use: No  . Sexual Activity: None   Other Topics Concern  . None   Social History Narrative  . None   Review of Systems: General: Denies fever, chills, diaphoresis, appetite change and fatigue.  HEENT: Positive for mild lip/nose pain. Otherwise negative. Respiratory: Denies SOB, DOE, cough, chest tightness, and wheezing.   Cardiovascular: Denies chest pain, palpitations and leg swelling.  Gastrointestinal: Denies nausea, vomiting, abdominal pain, diarrhea, constipation, blood in stool and abdominal distention.  Musculoskeletal: Denies myalgias, back pain, joint swelling, arthralgias and gait problem.  Skin: Denies pallor, rash and wounds.  Neurological: Denies dizziness, seizures, syncope, weakness, lightheadedness, numbness and headaches.  Psychiatric/Behavioral: Denies mood changes, confusion, nervousness, sleep disturbance and agitation.  Objective:  Physical Exam: Filed Vitals:   08/06/13 1508  BP: 139/83  Pulse: 67  Temp: 98 F (36.7 C)  TempSrc: Oral  Height: 5' 5.5" (1.664 m)  Weight: 219 lb (99.338 kg)  SpO2: 98%   General: Vital signs reviewed.  Patient is a well-developed and well-nourished, in no  acute distress and cooperative with exam. Alert and oriented x3.  Head: Normocephalic and atraumatic. Mild tenderness in the area of the left nostril. No discharge, erythema, swelling, or fluctuance.  Eyes: PERRL, EOMI, conjunctivae normal, No scleral icterus.  Neck: Supple, trachea midline, normal ROM, No JVD, masses, thyromegaly, or carotid bruit present.  Cardiovascular: RRR, S1 normal, S2 normal, no murmurs, gallops, or rubs. Pulmonary/Chest: Normal respiratory effort, CTAB, no wheezes, rales, or rhonchi. Abdominal: Soft, non-tender,  non-distended, bowel sounds are normal, no masses, organomegaly, or guarding present.  Musculoskeletal: No joint deformities, erythema, or stiffness, ROM full and no nontender. Extremities: No swelling or edema,  pulses symmetric and intact bilaterally. No cyanosis or clubbing. Neurological: A&O x3, Strength is normal and symmetric bilaterally, cranial nerve II-XII are grossly intact, no focal motor deficit, sensory intact to light touch bilaterally.  Skin: Warm, dry and intact. No rashes or erythema. Psychiatric: Normal mood and affect. speech and behavior is normal. Cognition and memory are normal.   Assessment & Plan:   Please see problem-based assessment and plan.

## 2013-08-07 ENCOUNTER — Encounter: Payer: Self-pay | Admitting: Internal Medicine

## 2013-08-07 DIAGNOSIS — L729 Follicular cyst of the skin and subcutaneous tissue, unspecified: Principal | ICD-10-CM

## 2013-08-07 DIAGNOSIS — L739 Follicular disorder, unspecified: Secondary | ICD-10-CM | POA: Insufficient documentation

## 2013-08-07 NOTE — Assessment & Plan Note (Signed)
Patient presents w/ complaints of pain and small swelling underneath the left nostril. Patient claims he recently plucked a nose hair (ended up pulling out a large clump) and 1 day later, began having pain in this area. On exam, patient w/ very small swelling w/ mild tenderness, no erythema, discharge, or fluctuance noted.  -Discussed importance of not plucking nose hairs d/t risk of infection 2/2 native bacterial flora.  -Patient is aware that he should return to clinic or ED if area worsens.

## 2013-08-07 NOTE — Assessment & Plan Note (Signed)
Patient w/ Chronic Hep C, has been shown to have elevated total bili for several years. Recently saw Dr. Earlie Server for follow up regarding lymphoma where he had CT chest/abd/pelv which showed cirrhosis w/ questionable portal venous HTN.  -Discussed new treatments for Hep C and possible referral to a GI/Hepatologist, declined for now. Will continue to discuss with patient on follow up visits.

## 2013-08-07 NOTE — Assessment & Plan Note (Signed)
Blood pressure well controlled today at 139/83. During last visit, considered increasing HCTZ, however, will hold off at this time given stable BP reading.  -Continue HCTZ 12.5 mg po qd.

## 2013-08-08 NOTE — Progress Notes (Signed)
Case discussed with Dr. Jones at time of visit.  We reviewed the resident's history and exam and pertinent patient test results.  I agree with the assessment, diagnosis, and plan of care documented in the resident's note. 

## 2013-09-03 DIAGNOSIS — F209 Schizophrenia, unspecified: Secondary | ICD-10-CM | POA: Diagnosis not present

## 2013-11-23 DIAGNOSIS — H04129 Dry eye syndrome of unspecified lacrimal gland: Secondary | ICD-10-CM | POA: Diagnosis not present

## 2013-11-23 DIAGNOSIS — H01009 Unspecified blepharitis unspecified eye, unspecified eyelid: Secondary | ICD-10-CM | POA: Diagnosis not present

## 2013-11-23 DIAGNOSIS — H25099 Other age-related incipient cataract, unspecified eye: Secondary | ICD-10-CM | POA: Diagnosis not present

## 2013-11-27 DIAGNOSIS — F209 Schizophrenia, unspecified: Secondary | ICD-10-CM | POA: Diagnosis not present

## 2013-12-25 ENCOUNTER — Ambulatory Visit (INDEPENDENT_AMBULATORY_CARE_PROVIDER_SITE_OTHER): Payer: Medicare Other | Admitting: Internal Medicine

## 2013-12-25 ENCOUNTER — Encounter: Payer: Self-pay | Admitting: Internal Medicine

## 2013-12-25 VITALS — BP 156/83 | HR 84 | Temp 98.0°F | Ht 66.0 in | Wt 228.3 lb

## 2013-12-25 DIAGNOSIS — L678 Other hair color and hair shaft abnormalities: Secondary | ICD-10-CM | POA: Diagnosis not present

## 2013-12-25 DIAGNOSIS — R221 Localized swelling, mass and lump, neck: Secondary | ICD-10-CM | POA: Diagnosis not present

## 2013-12-25 DIAGNOSIS — L738 Other specified follicular disorders: Secondary | ICD-10-CM

## 2013-12-25 DIAGNOSIS — R17 Unspecified jaundice: Secondary | ICD-10-CM | POA: Diagnosis not present

## 2013-12-25 DIAGNOSIS — R220 Localized swelling, mass and lump, head: Secondary | ICD-10-CM | POA: Diagnosis not present

## 2013-12-25 DIAGNOSIS — L739 Follicular disorder, unspecified: Secondary | ICD-10-CM

## 2013-12-25 DIAGNOSIS — N529 Male erectile dysfunction, unspecified: Secondary | ICD-10-CM

## 2013-12-25 DIAGNOSIS — R7309 Other abnormal glucose: Secondary | ICD-10-CM | POA: Diagnosis not present

## 2013-12-25 DIAGNOSIS — L729 Follicular cyst of the skin and subcutaneous tissue, unspecified: Secondary | ICD-10-CM

## 2013-12-25 DIAGNOSIS — Z23 Encounter for immunization: Secondary | ICD-10-CM | POA: Diagnosis not present

## 2013-12-25 DIAGNOSIS — L989 Disorder of the skin and subcutaneous tissue, unspecified: Secondary | ICD-10-CM | POA: Diagnosis not present

## 2013-12-25 DIAGNOSIS — I10 Essential (primary) hypertension: Secondary | ICD-10-CM

## 2013-12-25 DIAGNOSIS — C8589 Other specified types of non-Hodgkin lymphoma, extranodal and solid organ sites: Secondary | ICD-10-CM | POA: Diagnosis not present

## 2013-12-25 DIAGNOSIS — K047 Periapical abscess without sinus: Secondary | ICD-10-CM | POA: Diagnosis not present

## 2013-12-25 NOTE — Patient Instructions (Signed)
General Instructions:  1. Please schedule a follow up appointment for 6 months  2. Please take all medications as prescribed. DO NOT MISS ANY DOSES OF YOUR BLOOD PRESSURE MEDICINE.  FOCUS ON WEIGHT LOSS  CHECK YOUR BLOOD PRESSURE 5-10 TIMES PRIOR TO RETURNING TO THE CLINIC. WRITE IT DOWN IN THE LOG BOOK.  3. If you have worsening of your symptoms or new symptoms arise, please call the clinic ([phone]), or go to the ER immediately if symptoms are severe.  You have done a great job in taking all your medications. I appreciate it very much. Please continue doing that.   Please bring your medicines with you each time you come to clinic.  Medicines may include prescription medications, over-the-counter medications, herbal remedies, eye drops, vitamins, or other pills.   Progress Toward Treatment Goals:  Treatment Goal 12/25/2013  Blood pressure deteriorated    Self Care Goals & Plans:  Self Care Goal 12/25/2013  Manage my medications take my medicines as prescribed; bring my medications to every visit; refill my medications on time  Monitor my health -  Eat healthy foods drink diet soda or water instead of juice or soda; eat more vegetables; eat foods that are low in salt; eat baked foods instead of fried foods  Be physically active -    No flowsheet data found.   Care Management & Community Referrals:  Referral 12/25/2013  Referrals made for care management support none needed  Referrals made to community resources none

## 2013-12-25 NOTE — Progress Notes (Signed)
Subjective:   Patient ID: [name] male   DOB: Sep 19, 1960 53 y.o.   MRN: [id number]  HPI: [name] is a 53 y.o. M w/ PMHx of Non-Hodgkins Lymphoma (now in remission), HTN, Hep C, and Schizophrenia, presents to the clinic today for follow up. The patient has no significant complaints today. During his last clinic visit in 07/2013, the patient had a nasal folliculitis which has since resolved. He denies any further issues. [name] BP is elevated today as he claims he has not been very compliant w/ his HCTZ recently. Additionally, he has gained some weight recently as he is 228 lbs increased from 219 lbs on 08/06/13. He claims he is taking protein supplements when he exercises and says he could eat a more healthy diet w/ smaller portions. He otherwise denies any abdominal pain, diarrhea, nausea, SOB, chest pain, or LE edema. The patient is also requesting Viagra today as he has had an issue w/ erectile dysfunction for some time. He is not taking nitrates and denies chest pain.   Past Medical History  Diagnosis Date  . Hepatitis C     not treatable  . Hypertension   . Schizophrenia   . Hemorrhoids   . Rectal bleeding   . Abdominal pain   . Non Hodgkin's lymphoma   . Cancer     lymphoma   Current Outpatient Prescriptions  Medication Sig Dispense Refill  . benztropine (COGENTIN) 1 MG tablet Take 1 mg by mouth 2 (two) times daily.      . hydrochlorothiazide (MICROZIDE) 12.5 MG capsule Take 1 capsule (12.5 mg total) by mouth daily.  60 capsule  5  . ibuprofen (ADVIL) 200 MG tablet Take 1 tablet (200 mg total) by mouth every 6 (six) hours as needed for pain.  100 tablet  2  . OLANZapine (ZYPREXA) 20 MG tablet Take 40 mg by mouth at bedtime.       No current facility-administered medications for this visit.   No family history on file. History   Social History  . Marital Status: Single    Spouse Name: N/A    Number of Children: N/A  . Years of Education: N/A   Social History  Main Topics  . Smoking status: Former Smoker    Quit date: 04/26/1988  . Smokeless tobacco: None  . Alcohol Use: No  . Drug Use: No  . Sexual Activity: None   Other Topics Concern  . None   Social History Narrative  . None   Review of Systems: General: Positive for weight gain. Denies fever, chills, diaphoresis, appetite change and fatigue.  Respiratory: Denies SOB, DOE, cough, chest tightness, and wheezing.   Cardiovascular: Denies chest pain, palpitations and leg swelling.  Gastrointestinal: Denies nausea, vomiting, abdominal pain, diarrhea, constipation, blood in stool and abdominal distention.  Musculoskeletal: Denies myalgias, back pain, joint swelling, arthralgias and gait problem.  Skin: Denies pallor, rash and wounds.  Neurological: Denies dizziness, seizures, syncope, weakness, lightheadedness, numbness and headaches.  Psychiatric/Behavioral: Denies mood changes, confusion, nervousness, sleep disturbance and agitation.  Objective:  Physical Exam: Filed Vitals:   12/25/13 1600  BP: 156/83  Pulse: 84  Temp: 98 F (36.7 C)  TempSrc: Oral  Height: 5\' 6"  (1.676 m)  Weight: 228 lb 4.8 oz (103.556 kg)  SpO2: 98%   General: Alert, cooperative, NAD. HEENT: PERRL, EOMI. Moist mucus membranes Neck: Full range of motion without pain, supple, no lymphadenopathy or carotid bruits Lungs: Clear to ascultation bilaterally, normal work of respiration,  no wheezes, rales, rhonchi Heart: RRR, no murmurs, gallops, or rubs Abdomen: Soft, non-tender, non-distended, BS + Extremities: No cyanosis, clubbing, or edema Neurologic: Alert & oriented X3, cranial nerves II-XII intact, strength grossly intact, sensation intact to light touch  Assessment & Plan:   Please see problem-based assessment and plan.

## 2013-12-26 ENCOUNTER — Encounter: Payer: Self-pay | Admitting: Internal Medicine

## 2013-12-26 MED ORDER — SILDENAFIL CITRATE 100 MG PO TABS: 100.0000 mg | ORAL_TABLET | ORAL | Status: DC | PRN

## 2013-12-26 MED ORDER — SILDENAFIL CITRATE 25 MG PO TABS: 25.0000 mg | ORAL_TABLET | ORAL | Status: DC | PRN

## 2013-12-26 NOTE — Progress Notes (Signed)
Case discussed with Dr. Jones at the time of the visit.  We reviewed the resident's history and exam and pertinent patient test results.  I agree with the assessment, diagnosis, and plan of care documented in the resident's note. 

## 2013-12-26 NOTE — Assessment & Plan Note (Signed)
BP Readings from Last 3 Encounters:  12/25/13 156/83  08/06/13 139/83  06/27/13 130/79    Lab Results  Component Value Date   NA 141 06/26/2013   K 4.1 06/26/2013   CREATININE 1.0 06/26/2013    Assessment: Blood pressure control: mildly elevated Progress toward BP goal:  deteriorated Comments: Patient's BP elevated today, 2/2 non-compliance. He claims he has not been very good about taking his HCTZ every day and says he has not taken it today. Discussed compliance at length and risks of having elevated BP. Also discussed weight loss at length given his 9 lb weight gain since his last clinic visit. He says he will try and eat smaller portions and will stop using protein supplements when he exercises as this is most likely contributing to his weight gain.   Plan: Medications:  continue current medications; HCTZ 12.5 mg po qd.  Educational resources provided: brochure Self management tools provided: home blood pressure logbook Other plans: Patient is to keep track of 5-10 BP values prior to his next clinic visit to determine if changes need to be made to his medication when he is compliant.  -RTC in 6 months

## 2013-12-26 NOTE — Assessment & Plan Note (Addendum)
Patient states he is having erectile dysfunction. He denies any pain, discomfort, chest pain, and does not use nitrates. It appears he has been prescribed this in the past (2012). -Given Rx for Viagra 25 mg #5 w/ 1 refill (this is the dose he had been prescribed previously).

## 2014-01-17 ENCOUNTER — Ambulatory Visit (INDEPENDENT_AMBULATORY_CARE_PROVIDER_SITE_OTHER): Payer: Medicare Other | Admitting: *Deleted

## 2014-01-17 DIAGNOSIS — Z23 Encounter for immunization: Secondary | ICD-10-CM

## 2014-01-30 ENCOUNTER — Other Ambulatory Visit: Payer: Self-pay | Admitting: Internal Medicine

## 2014-03-04 DIAGNOSIS — F209 Schizophrenia, unspecified: Secondary | ICD-10-CM | POA: Diagnosis not present

## 2014-06-14 DIAGNOSIS — F209 Schizophrenia, unspecified: Secondary | ICD-10-CM | POA: Diagnosis not present

## 2014-06-25 ENCOUNTER — Encounter (HOSPITAL_COMMUNITY): Payer: Self-pay

## 2014-06-25 ENCOUNTER — Other Ambulatory Visit (HOSPITAL_BASED_OUTPATIENT_CLINIC_OR_DEPARTMENT_OTHER): Payer: Medicare Other

## 2014-06-25 ENCOUNTER — Ambulatory Visit (HOSPITAL_COMMUNITY)
Admission: RE | Admit: 2014-06-25 | Discharge: 2014-06-25 | Disposition: A | Discharge status: Home / Self Care | Payer: Medicare Other | Source: Ambulatory Visit | Attending: Internal Medicine | Admitting: Internal Medicine

## 2014-06-25 DIAGNOSIS — K7460 Unspecified cirrhosis of liver: Secondary | ICD-10-CM | POA: Diagnosis not present

## 2014-06-25 DIAGNOSIS — Z9221 Personal history of antineoplastic chemotherapy: Secondary | ICD-10-CM | POA: Diagnosis not present

## 2014-06-25 DIAGNOSIS — C8599 Non-Hodgkin lymphoma, unspecified, extranodal and solid organ sites: Secondary | ICD-10-CM | POA: Diagnosis not present

## 2014-06-25 DIAGNOSIS — J439 Emphysema, unspecified: Secondary | ICD-10-CM | POA: Diagnosis not present

## 2014-06-25 DIAGNOSIS — C169 Malignant neoplasm of stomach, unspecified: Secondary | ICD-10-CM | POA: Diagnosis not present

## 2014-06-25 DIAGNOSIS — Z8572 Personal history of non-Hodgkin lymphomas: Secondary | ICD-10-CM

## 2014-06-25 LAB — COMPREHENSIVE METABOLIC PANEL (CC13)
ALT: 36 U/L (ref 0–55)
AST: 44 U/L — ABNORMAL HIGH (ref 5–34)
Albumin: 3.9 g/dL (ref 3.5–5.0)
Alkaline Phosphatase: 88 U/L (ref 40–150)
Anion Gap: 11 mEq/L (ref 3–11)
BUN: 12 mg/dL (ref 7.0–26.0)
CO2: 24 mEq/L (ref 22–29)
Calcium: 9.7 mg/dL (ref 8.4–10.4)
Chloride: 106 mEq/L (ref 98–109)
Creatinine: 0.9 mg/dL (ref 0.7–1.3)
EGFR: >90 mL/min/{1.73_m2} (ref >90)
Glucose: 99 mg/dl (ref 70–140)
Potassium: 4.3 mEq/L (ref 3.5–5.1)
Sodium: 141 mEq/L (ref 136–145)
Total Bilirubin: 1.27 mg/dL — ABNORMAL HIGH (ref 0.20–1.20)
Total Protein: 7.9 g/dL (ref 6.4–8.3)

## 2014-06-25 LAB — CBC WITH DIFFERENTIAL/PLATELET
BASO%: 0.7 % (ref 0.0–2.0)
Basophils Absolute: 0 10*3/uL (ref 0.0–0.1)
EOS%: 0.7 % (ref 0.0–7.0)
Eosinophils Absolute: 0 10*3/uL (ref 0.0–0.5)
HCT: 44.6 % (ref 38.4–49.9)
HGB: 14.2 g/dL (ref 13.0–17.1)
LYMPH%: 23.2 % (ref 14.0–49.0)
MCH: 25.7 pg — ABNORMAL LOW (ref 27.2–33.4)
MCHC: 31.8 g/dL — ABNORMAL LOW (ref 32.0–36.0)
MCV: 80.8 fL (ref 79.3–98.0)
MONO#: 0.6 10*3/uL (ref 0.1–0.9)
MONO%: 12 % (ref 0.0–14.0)
NEUT#: 3.3 10*3/uL (ref 1.5–6.5)
NEUT%: 63.4 % (ref 39.0–75.0)
Platelets: 139 10*3/uL — ABNORMAL LOW (ref 140–400)
RBC: 5.51 10*6/uL (ref 4.20–5.82)
RDW: 13.1 % (ref 11.0–14.6)
WBC: 5.2 10*3/uL (ref 4.0–10.3)
lymph#: 1.2 10*3/uL (ref 0.9–3.3)

## 2014-06-25 LAB — LACTATE DEHYDROGENASE (CC13): LDH: 175 U/L (ref 125–245)

## 2014-06-25 MED ORDER — IOHEXOL 300 MG/ML  SOLN
100.0000 mL | Freq: Once | INTRAMUSCULAR | Status: AC | PRN
  Administered 2014-06-25: 100 mL via INTRAVENOUS

## 2014-07-02 ENCOUNTER — Ambulatory Visit (HOSPITAL_BASED_OUTPATIENT_CLINIC_OR_DEPARTMENT_OTHER): Payer: Medicare Other | Admitting: Internal Medicine

## 2014-07-02 ENCOUNTER — Telehealth: Payer: Self-pay | Admitting: Internal Medicine

## 2014-07-02 ENCOUNTER — Encounter: Payer: Self-pay | Admitting: Internal Medicine

## 2014-07-02 VITALS — BP 157/79 | HR 86 | Temp 98.5°F | Resp 18 | Ht 66.0 in | Wt 215.7 lb

## 2014-07-02 DIAGNOSIS — C8590 Non-Hodgkin lymphoma, unspecified, unspecified site: Secondary | ICD-10-CM

## 2014-07-02 DIAGNOSIS — Z8572 Personal history of non-Hodgkin lymphomas: Secondary | ICD-10-CM

## 2014-07-02 DIAGNOSIS — R21 Rash and other nonspecific skin eruption: Secondary | ICD-10-CM

## 2014-07-02 NOTE — Progress Notes (Signed)
[name] [phone]   [phone]  OFFICE PROGRESS NOTE  PRINCIPAL DIAGNOSIS: Stage IV non-Hodgkin lymphoma diagnosed in March 2009.   PRIOR THERAPY:  1. Status post 4 weekly doses of Rituxan during the patient's hospitalization at the intensive care unit at [facility] with respiratory failure. 2. Status post 7 cycles of systemic chemotherapy with CHOP/Rituxan. Last dose was given January 15, 2008.  CURRENT THERAPY: Observation.  INTERVAL HISTORY: [name] 54 y.o. male returns to the clinic today for annual followup visit accompanied by his sister. The patient is feeling fine today with no specific complaints except for 2-3 vesicular rash in the left axilla started few days ago. He denied having any significant weight loss or night sweats. He has no palpable lymphadenopathy. He denied having any significant chest pain, shortness of breath, cough or hemoptysis. The patient had repeat CT scan of the chest, abdomen and pelvis performed recently and he is here for evaluation and discussion of his scan results.  MEDICAL HISTORY: Past Medical History  Diagnosis Date  . Hepatitis C   . Hypertension   . Schizophrenia   . Hemorrhoids   . Rectal bleeding   . Non Hodgkin's lymphoma   . Cancer     lymphoma    ALLERGIES:  has No Known Allergies.  MEDICATIONS:  Current Outpatient Prescriptions  Medication Sig Dispense Refill  . OLANZapine (ZYPREXA) 20 MG tablet Take 40 mg by mouth at bedtime.    . sildenafil (VIAGRA) 25 MG tablet Take 1 tablet (25 mg total) by mouth as needed for erectile dysfunction. 5 tablet 1   No current facility-administered medications for this visit.    REVIEW OF SYSTEMS:  A comprehensive review of systems was negative.   PHYSICAL EXAMINATION: General appearance: alert, cooperative and no distress Head: Normocephalic, without obvious abnormality, atraumatic Neck: no adenopathy Lymph nodes: Cervical,  supraclavicular, and axillary nodes normal. Resp: clear to auscultation bilaterally Cardio: regular rate and rhythm, S1, S2 normal, no murmur, click, rub or gallop GI: soft, non-tender; bowel sounds normal; no masses,  no organomegaly Extremities: extremities normal, atraumatic, no cyanosis or edema  ECOG PERFORMANCE STATUS: 0 - Asymptomatic  Blood pressure 157/79, pulse 86, temperature 98.5 F (36.9 C), temperature source Oral, resp. rate 18, height 5\' 6"  (1.676 m), weight 215 lb 11.2 oz (97.841 kg), SpO2 99 %.  LABORATORY DATA: Lab Results  Component Value Date   WBC 5.2 06/25/2014   HGB 14.2 06/25/2014   HCT 44.6 06/25/2014   MCV 80.8 06/25/2014   PLT 139* 06/25/2014      Chemistry      Component Value Date/Time   NA 141 06/25/2014 0851   NA 141 03/13/2013 1627   NA 139 12/18/2010 1219   K 4.3 06/25/2014 0851   K 4.3 03/13/2013 1627   K 4.5 12/18/2010 1219   CL 104 03/13/2013 1627   CL 107 06/20/2012 1032   CL 103 12/18/2010 1219   CO2 24 06/25/2014 0851   CO2 31 03/13/2013 1627   CO2 26 12/18/2010 1219   BUN 12.0 06/25/2014 0851   BUN 8 03/13/2013 1627   BUN 9 12/18/2010 1219   CREATININE 0.9 06/25/2014 0851   CREATININE 1.00 03/13/2013 1627   CREATININE 0.97 01/24/2013 1145      Component Value Date/Time   CALCIUM 9.7 06/25/2014 0851   CALCIUM 9.7 03/13/2013 1627   CALCIUM 8.8 12/18/2010 1219   ALKPHOS 88 06/25/2014 0851  ALKPHOS 93 03/13/2013 1627   ALKPHOS 104* 12/18/2010 1219   AST 44* 06/25/2014 0851   AST 37 03/13/2013 1627   AST 145* 12/18/2010 1219   ALT 36 06/25/2014 0851   ALT 33 03/13/2013 1627   ALT 82* 12/18/2010 1219   BILITOT 1.27* 06/25/2014 0851   BILITOT 1.4* 03/13/2013 1627   BILITOT 2.40* 12/18/2010 1219       RADIOGRAPHIC STUDIES: Ct Chest W Contrast  06/25/2014   CLINICAL DATA:  Non-Hodgkin's lymphoma, abdominal origin diagnosis 2009 with stomach cancer. Staging. Chemotherapy complete. Follow-up.  EXAM: CT CHEST, ABDOMEN, AND  PELVIS WITH CONTRAST  TECHNIQUE: Multidetector CT imaging of the chest, abdomen and pelvis was performed following the standard protocol during bolus administration of intravenous contrast.  CONTRAST:  160mL OMNIPAQUE IOHEXOL 300 MG/ML  SOLN  COMPARISON:  06/26/2013  FINDINGS: CT CHEST FINDINGS  See tiny right lower lobe subpleural nodules on image 33 and 35, too small to measure, not changed since prior study. No new or enlarging pulmonary nodules. Linear scarring in the left base. No pleural effusions.  Mild emphysematous changes in the lung apices.  Heart is normal size. Aorta is normal caliber. No mediastinal, hilar, or axillary adenopathy. Chest wall soft tissues are unremarkable.  No acute bony abnormality.  CT ABDOMEN AND PELVIS FINDINGS  Nodular contours of the liver again noted compatible with cirrhosis. Associated splenomegaly with a craniocaudal length of 13.3 cm compared with 13.9 cm previously. Mildly heterogeneous appearance of the spleen is stable. Portal vein is patent.  Pancreas, adrenals, kidneys are unremarkable.  No hydronephrosis.  Appendix is visualized and is normal. Umbilical hernia containing fat. Stomach, large and small bowel are unremarkable. Aorta is normal caliber.  No free fluid or free air. 2.5 cm soft tissue lesion in the left inguinal region is partially imaged on today's study. This measured up to 2.7 cm previously. No suspicious adenopathy.  No acute bony abnormality or focal bone lesion.  IMPRESSION: Changes of cirrhosis with associated splenomegaly, not significantly changed.  Stable, presumably benign left inguinal lymph node.  No suspicious adenopathy in the chest, abdomen or pelvis.   Electronically Signed   By: Rolm Baptise M.D.   On: 06/25/2014 10:26   Ct Abdomen Pelvis W Contrast  06/25/2014   CLINICAL DATA:  Non-Hodgkin's lymphoma, abdominal origin diagnosis 2009 with stomach cancer. Staging. Chemotherapy complete. Follow-up.  EXAM: CT CHEST, ABDOMEN, AND PELVIS WITH  CONTRAST  TECHNIQUE: Multidetector CT imaging of the chest, abdomen and pelvis was performed following the standard protocol during bolus administration of intravenous contrast.  CONTRAST:  131mL OMNIPAQUE IOHEXOL 300 MG/ML  SOLN  COMPARISON:  06/26/2013  FINDINGS: CT CHEST FINDINGS  See tiny right lower lobe subpleural nodules on image 33 and 35, too small to measure, not changed since prior study. No new or enlarging pulmonary nodules. Linear scarring in the left base. No pleural effusions.  Mild emphysematous changes in the lung apices.  Heart is normal size. Aorta is normal caliber. No mediastinal, hilar, or axillary adenopathy. Chest wall soft tissues are unremarkable.  No acute bony abnormality.  CT ABDOMEN AND PELVIS FINDINGS  Nodular contours of the liver again noted compatible with cirrhosis. Associated splenomegaly with a craniocaudal length of 13.3 cm compared with 13.9 cm previously. Mildly heterogeneous appearance of the spleen is stable. Portal vein is patent.  Pancreas, adrenals, kidneys are unremarkable.  No hydronephrosis.  Appendix is visualized and is normal. Umbilical hernia containing fat. Stomach, large and small bowel are unremarkable.  Aorta is normal caliber.  No free fluid or free air. 2.5 cm soft tissue lesion in the left inguinal region is partially imaged on today's study. This measured up to 2.7 cm previously. No suspicious adenopathy.  No acute bony abnormality or focal bone lesion.  IMPRESSION: Changes of cirrhosis with associated splenomegaly, not significantly changed.  Stable, presumably benign left inguinal lymph node.  No suspicious adenopathy in the chest, abdomen or pelvis.   Electronically Signed   By: Rolm Baptise M.D.   On: 06/25/2014 10:26    ASSESSMENT AND PLAN: This is a very pleasant 54 years old [place] American male with history of stage IV non-Hodgkin lymphoma diagnosed in March of 2009 status post systemic chemotherapy and has been observation since September of  2009 with no evidence for disease recurrence. I discussed the scan results with the patient today. I recommended for him to continue on observation with repeat CT scan of the chest, abdomen and pelvis in one year.  For the visit rash, I asked the patient to apply local antibiotic ointment to this area and if no improvement he will need to see his primary care physician for reevaluation and to rule out the development of herpes zoster. The patient was advised to call me immediately if he has any concerning symptoms in the interval.  All questions were answered. The patient knows to call the clinic with any problems, questions or concerns. We can certainly see the patient much sooner if necessary.  Disclaimer: This note was dictated with voice recognition software. Similar sounding words can inadvertently be transcribed and may not be corrected upon review.

## 2014-07-02 NOTE — Telephone Encounter (Signed)
gv and printed appt sched and avs for pt for march 2017....gv pt barium

## 2014-07-05 ENCOUNTER — Encounter (HOSPITAL_COMMUNITY): Payer: Self-pay | Admitting: Emergency Medicine

## 2014-07-05 ENCOUNTER — Emergency Department (INDEPENDENT_AMBULATORY_CARE_PROVIDER_SITE_OTHER)
Admission: EM | Admit: 2014-07-05 | Discharge: 2014-07-05 | Disposition: A | Discharge status: Home / Self Care | Payer: Medicare Other | Source: Home / Self Care | Attending: Emergency Medicine | Admitting: Emergency Medicine

## 2014-07-05 DIAGNOSIS — B029 Zoster without complications: Secondary | ICD-10-CM

## 2014-07-05 MED ORDER — VALACYCLOVIR HCL 1 G PO TABS: 1000.0000 mg | ORAL_TABLET | Freq: Three times a day (TID) | ORAL | Status: AC

## 2014-07-05 MED ORDER — SULFAMETHOXAZOLE-TRIMETHOPRIM 800-160 MG PO TABS: 1.0000 | ORAL_TABLET | Freq: Two times a day (BID) | ORAL | Status: AC

## 2014-07-05 NOTE — ED Provider Notes (Signed)
CSN: [id number]     Arrival date & time 07/05/14  1426 History   First MD Initiated Contact with Patient 07/05/14 1541     Chief Complaint  Patient presents with  . Rash   (Consider location/radiation/quality/duration/timing/severity/associated sxs/prior Treatment) HPI He is a 54 year old man here for evaluation of rash. The rash is located on the left side just distal to the axilla. It was initially itchy, but now is painful. It showed up about 3-4 days ago. No associated fever. One of the spots is draining.  Past Medical History  Diagnosis Date  . Hepatitis C   . Hypertension   . Schizophrenia   . Hemorrhoids   . Rectal bleeding   . Non Hodgkin's lymphoma   . Cancer     lymphoma   History reviewed. No pertinent past surgical history. No family history on file. History  Substance Use Topics  . Smoking status: Former Smoker    Quit date: 04/26/1988  . Smokeless tobacco: Not on file  . Alcohol Use: No    Review of Systems As in history of present illness Allergies  Review of patient's allergies indicates no known allergies.  Home Medications   Prior to Admission medications   Medication Sig Start Date End Date Taking? Authorizing Provider  OLANZapine (ZYPREXA) 20 MG tablet Take 40 mg by mouth at bedtime.   Yes Historical Provider, MD  sildenafil (VIAGRA) 25 MG tablet Take 1 tablet (25 mg total) by mouth as needed for erectile dysfunction. 12/26/13 12/26/14  Corky Sox, MD  sulfamethoxazole-trimethoprim (BACTRIM DS,SEPTRA DS) 800-160 MG per tablet Take 1 tablet by mouth 2 (two) times daily. 07/05/14 07/12/14  Melony Overly, MD  valACYclovir (VALTREX) 1000 MG tablet Take 1 tablet (1,000 mg total) by mouth 3 (three) times daily. 07/05/14 07/19/14  Melony Overly, MD   BP 152/86 mmHg  Pulse 79  Temp(Src) 98.2 F (36.8 C) (Oral)  Resp 14  SpO2 95% Physical Exam  Constitutional: He is oriented to person, place, and time. He appears well-developed and well-nourished. No distress.   Cardiovascular: Normal rate.   Pulmonary/Chest: Effort normal.  Neurological: He is alert and oriented to person, place, and time.  Skin:  Vesicular rash in dermatomal distribution under the left axilla. Some of the vesicles have some surrounding induration. One of the vesicles has ruptured and is draining pus.    ED Course  Procedures (including critical care time) Labs Review Labs Reviewed - No data to display  Imaging Review No results found.   MDM   1. Shingles    Also with some bacterial superinfection. We'll treat with Valtrex and Bactrim. Follow-up as needed.    Melony Overly, MD 07/05/14 [phone]

## 2014-07-05 NOTE — ED Notes (Signed)
C/o rash on left side just below axilla  Started out itching but now has become painful Denies fevers and drainage Alert, no signs of acute distress.

## 2014-07-05 NOTE — Discharge Instructions (Signed)
You have shingles. Some of the spot are also infected. Take Valtrex 1 pill 3 times a day for 1 week. Take Bactrim 1 pill twice a day for one week. Follow-up here or with your primary care doctor if things are not improving by one week.

## 2014-09-18 ENCOUNTER — Emergency Department (INDEPENDENT_AMBULATORY_CARE_PROVIDER_SITE_OTHER)
Admission: EM | Admit: 2014-09-18 | Discharge: 2014-09-18 | Disposition: A | Discharge status: Home / Self Care | Payer: Medicare Other | Source: Home / Self Care | Attending: Family Medicine | Admitting: Family Medicine

## 2014-09-18 ENCOUNTER — Encounter (HOSPITAL_COMMUNITY): Payer: Self-pay | Admitting: Emergency Medicine

## 2014-09-18 DIAGNOSIS — L02211 Cutaneous abscess of abdominal wall: Secondary | ICD-10-CM | POA: Diagnosis not present

## 2014-09-18 MED ORDER — SULFAMETHOXAZOLE-TRIMETHOPRIM 800-160 MG PO TABS: 2.0000 | ORAL_TABLET | Freq: Two times a day (BID) | ORAL | Status: DC

## 2014-09-18 MED ORDER — SULFAMETHOXAZOLE-TRIMETHOPRIM 800-160 MG PO TABS
2.0000 | ORAL_TABLET | Freq: Two times a day (BID) | ORAL | Status: DC
Start: 2014-09-18 — End: 2014-11-06

## 2014-09-18 MED ORDER — MUPIROCIN 2 % EX OINT
1.0000 | TOPICAL_OINTMENT | Freq: Two times a day (BID) | CUTANEOUS | Status: DC
Start: 2014-09-18 — End: 2014-11-06

## 2014-09-18 NOTE — ED Provider Notes (Signed)
[name] is a 54 y.o. male who presents to Urgent Care today for abdominal wall abscess. Patient has a draining area on his left anterior abdominal wall present for the last few days. It is mildly tender. No fevers or chills nausea vomiting or diarrhea. He has used some anabiotic ointment which seemed to help some.   Past Medical History  Diagnosis Date  . Hepatitis C   . Hypertension   . Schizophrenia   . Hemorrhoids   . Rectal bleeding   . Non Hodgkin's lymphoma   . Cancer     lymphoma   History reviewed. No pertinent past surgical history. History  Substance Use Topics  . Smoking status: Former Smoker    Quit date: 04/26/1988  . Smokeless tobacco: Not on file  . Alcohol Use: No   ROS as above Medications: No current facility-administered medications for this encounter.   Current Outpatient Prescriptions  Medication Sig Dispense Refill  . mupirocin ointment (BACTROBAN) 2 % Apply 1 application topically 2 (two) times daily. 30 g 0  . OLANZapine (ZYPREXA) 20 MG tablet Take 40 mg by mouth at bedtime.    . sildenafil (VIAGRA) 25 MG tablet Take 1 tablet (25 mg total) by mouth as needed for erectile dysfunction. 5 tablet 1  . sulfamethoxazole-trimethoprim (BACTRIM DS,SEPTRA DS) 800-160 MG per tablet Take 2 tablets by mouth 2 (two) times daily. 28 tablet 0  . sulfamethoxazole-trimethoprim (BACTRIM DS,SEPTRA DS) 800-160 MG per tablet Take 2 tablets by mouth 2 (two) times daily. 28 tablet 0   No Known Allergies   Exam:  BP 135/81 mmHg  Pulse 94  Temp(Src) 99.2 F (37.3 C) (Oral)  Resp 16  SpO2 98% Gen: Well NAD Skin: Draining abscess left interior wall. Blunt dissection used to break up loculations. Pus expressed. No incision was needed.  No results found for this or any previous visit (from the past 24 hour(s)). No results found.  Assessment and Plan: 54 y.o. male with left interior cutaneous abscess. Treat empirically with Bactrim and mupirocin ointment. Continue  draining. Return as needed.  Discussed warning signs or symptoms. Please see discharge instructions. Patient expresses understanding.     Gregor Hams, MD 09/18/14 [phone]

## 2014-09-18 NOTE — Discharge Instructions (Signed)
Thank you for coming in today. Keep the wound clean and use warm compress.  Come back as needed.  Take bactrim and use the cream.  Return as needed.    Abscess An abscess is an infected area that contains a collection of pus and debris.It can occur in almost any part of the body. An abscess is also known as a furuncle or boil. CAUSES  An abscess occurs when tissue gets infected. This can occur from blockage of oil or sweat glands, infection of hair follicles, or a minor injury to the skin. As the body tries to fight the infection, pus collects in the area and creates pressure under the skin. This pressure causes pain. People with weakened immune systems have difficulty fighting infections and get certain abscesses more often.  SYMPTOMS Usually an abscess develops on the skin and becomes a painful mass that is red, warm, and tender. If the abscess forms under the skin, you may feel a moveable soft area under the skin. Some abscesses break open (rupture) on their own, but most will continue to get worse without care. The infection can spread deeper into the body and eventually into the bloodstream, causing you to feel ill.  DIAGNOSIS  Your caregiver will take your medical history and perform a physical exam. A sample of fluid may also be taken from the abscess to determine what is causing your infection. TREATMENT  Your caregiver may prescribe antibiotic medicines to fight the infection. However, taking antibiotics alone usually does not cure an abscess. Your caregiver may need to make a small cut (incision) in the abscess to drain the pus. In some cases, gauze is packed into the abscess to reduce pain and to continue draining the area. HOME CARE INSTRUCTIONS   Only take over-the-counter or prescription medicines for pain, discomfort, or fever as directed by your caregiver.  If you were prescribed antibiotics, take them as directed. Finish them even if you start to feel better.  If gauze is  used, follow your caregiver's directions for changing the gauze.  To avoid spreading the infection:  Keep your draining abscess covered with a bandage.  Wash your hands well.  Do not share personal care items, towels, or whirlpools with others.  Avoid skin contact with others.  Keep your skin and clothes clean around the abscess.  Keep all follow-up appointments as directed by your caregiver. SEEK MEDICAL CARE IF:   You have increased pain, swelling, redness, fluid drainage, or bleeding.  You have muscle aches, chills, or a general ill feeling.  You have a fever. MAKE SURE YOU:   Understand these instructions.  Will watch your condition.  Will get help right away if you are not doing well or get worse. Document Released: 01/20/2005 Document Revised: 10/12/2011 Document Reviewed: 06/25/2011 [facility] Patient Information 2015 [place], [place]. This information is not intended to replace advice given to you by your health care provider. Make sure you discuss any questions you have with your health care provider.

## 2014-09-18 NOTE — ED Notes (Signed)
C/o abscess on abd area for five days  States abscess is draining pus and blood Did use antibiotic cream as tx

## 2014-09-24 ENCOUNTER — Encounter: Payer: Self-pay | Admitting: Internal Medicine

## 2014-09-24 ENCOUNTER — Ambulatory Visit (INDEPENDENT_AMBULATORY_CARE_PROVIDER_SITE_OTHER): Payer: Medicare Other | Admitting: Internal Medicine

## 2014-09-24 VITALS — BP 134/82 | HR 92 | Temp 98.2°F | Ht 66.0 in | Wt 219.1 lb

## 2014-09-24 DIAGNOSIS — I10 Essential (primary) hypertension: Secondary | ICD-10-CM

## 2014-09-24 DIAGNOSIS — F209 Schizophrenia, unspecified: Secondary | ICD-10-CM | POA: Diagnosis not present

## 2014-09-24 DIAGNOSIS — B182 Chronic viral hepatitis C: Secondary | ICD-10-CM | POA: Diagnosis not present

## 2014-09-24 DIAGNOSIS — Z0000 Encounter for general adult medical examination without abnormal findings: Secondary | ICD-10-CM | POA: Insufficient documentation

## 2014-09-24 LAB — COMPLETE METABOLIC PANEL WITH GFR
ALT: 32 U/L (ref 0–53)
AST: 34 U/L (ref 0–37)
Albumin: 4.3 g/dL (ref 3.5–5.2)
Alkaline Phosphatase: 84 U/L (ref 39–117)
BUN: 13 mg/dL (ref 6–23)
CO2: 27 mEq/L (ref 19–32)
Calcium: 9.5 mg/dL (ref 8.4–10.5)
Chloride: 102 mEq/L (ref 96–112)
Creat: 1.09 mg/dL (ref 0.50–1.35)
GFR, Est African American: 88 mL/min
GFR, Est Non African American: 76 mL/min
Glucose, Bld: 100 mg/dL — ABNORMAL HIGH (ref 70–99)
Potassium: 4.4 mEq/L (ref 3.5–5.3)
Sodium: 139 mEq/L (ref 135–145)
Total Bilirubin: 1.3 mg/dL — ABNORMAL HIGH (ref 0.2–1.2)
Total Protein: 7.8 g/dL (ref 6.0–8.3)

## 2014-09-24 LAB — CBC WITH DIFFERENTIAL/PLATELET
Basophils Absolute: 0 10*3/uL (ref 0.0–0.1)
Basophils Relative: 0 % (ref 0–1)
Eosinophils Absolute: 0.1 10*3/uL (ref 0.0–0.7)
Eosinophils Relative: 1 % (ref 0–5)
HCT: 40.5 % (ref 39.0–52.0)
Hemoglobin: 14.3 g/dL (ref 13.0–17.0)
Lymphocytes Relative: 25 % (ref 12–46)
Lymphs Abs: 1.3 10*3/uL (ref 0.7–4.0)
MCH: 27.3 pg (ref 26.0–34.0)
MCHC: 35.3 g/dL (ref 30.0–36.0)
MCV: 77.4 fL — ABNORMAL LOW (ref 78.0–100.0)
MPV: 10.5 fL (ref 8.6–12.4)
Monocytes Absolute: 0.8 10*3/uL (ref 0.1–1.0)
Monocytes Relative: 15 % — ABNORMAL HIGH (ref 3–12)
Neutro Abs: 3.1 10*3/uL (ref 1.7–7.7)
Neutrophils Relative %: 59 % (ref 43–77)
Platelets: 137 10*3/uL — ABNORMAL LOW (ref 150–400)
RBC: 5.23 MIL/uL (ref 4.22–5.81)
RDW: 13.2 % (ref 11.5–15.5)
WBC: 5.3 10*3/uL (ref 4.0–10.5)

## 2014-09-24 LAB — PROTIME-INR
INR: 1.16 (ref 0.00–1.49)
Prothrombin Time: 15 seconds (ref 11.6–15.2)

## 2014-09-24 MED ORDER — HYDROCHLOROTHIAZIDE 12.5 MG PO CAPS: 12.5000 mg | ORAL_CAPSULE | Freq: Every day | ORAL | Status: DC

## 2014-09-24 NOTE — Progress Notes (Signed)
Subjective:   Patient ID: [name] male   DOB: 28-Apr-1960 54 y.o.   MRN: [id number]  HPI: [name] is a 54 y.o. M w/ PMHx of Non-Hodgkins Lymphoma (now in remission), HTN, Hep C, and Schizophrenia, presents to the clinic today for follow up regarding his blood pressure. Patient is doing well today, accompanied by his sister. He has no complaints. Patient was recently seen in the ED on 09/18/14 w/ a small superficial abscess on his abdomen at which time he was given an Rx for Bactrim. Today, the patient states it is no longer painful and has stopped draining completely. No fevers, chills, malaise, erythema, or tenderness. Patient also had episode of shingles in 06/2014 which has completely resolved.  Today we discussed his chronic hepatitis C as he had previously declined further management at recent clinic visits. Today he states he would be interested in pursuing further management and therapies.   Past Medical History  Diagnosis Date  . Hepatitis C   . Hypertension   . Schizophrenia   . Hemorrhoids   . Rectal bleeding   . Non Hodgkin's lymphoma   . Cancer     lymphoma   Current Outpatient Prescriptions  Medication Sig Dispense Refill  . mupirocin ointment (BACTROBAN) 2 % Apply 1 application topically 2 (two) times daily. 30 g 0  . OLANZapine (ZYPREXA) 20 MG tablet Take 40 mg by mouth at bedtime.    . sildenafil (VIAGRA) 25 MG tablet Take 1 tablet (25 mg total) by mouth as needed for erectile dysfunction. 5 tablet 1  . sulfamethoxazole-trimethoprim (BACTRIM DS,SEPTRA DS) 800-160 MG per tablet Take 2 tablets by mouth 2 (two) times daily. 28 tablet 0  . sulfamethoxazole-trimethoprim (BACTRIM DS,SEPTRA DS) 800-160 MG per tablet Take 2 tablets by mouth 2 (two) times daily. 28 tablet 0   No current facility-administered medications for this visit.   No family history on file. History   Social History  . Marital Status: Single    Spouse Name: N/A  . Number of Children: N/A  .  Years of Education: N/A   Social History Main Topics  . Smoking status: Former Smoker    Quit date: 04/26/1988  . Smokeless tobacco: Not on file  . Alcohol Use: No  . Drug Use: No  . Sexual Activity: Not on file   Other Topics Concern  . None   Social History Narrative   Review of Systems  General: Denies fever, diaphoresis, appetite change, and fatigue.  Respiratory: Denies SOB, cough, and wheezing.   Cardiovascular: Denies chest pain and palpitations.  Gastrointestinal: Denies nausea, vomiting, abdominal pain, and diarrhea Musculoskeletal: Denies myalgias, arthralgias, back pain, and gait problem.  Neurological: Denies dizziness, syncope, weakness, lightheadedness, and headaches.  Psychiatric/Behavioral: Denies mood changes, sleep disturbance, and agitation.   Objective:  Physical Exam: Filed Vitals:   09/24/14 1547  BP: 134/82  Pulse: 92  Temp: 98.2 F (36.8 C)  TempSrc: Oral  Height: 5\' 6"  (1.676 m)  Weight: 219 lb 1.6 oz (99.383 kg)  SpO2: 99%   General: Overweight AA male, alert, cooperative, NAD. HEENT: PERRL, EOMI. Moist mucus membranes Neck: Full range of motion without pain, supple, no lymphadenopathy or carotid bruits Lungs: Clear to ascultation bilaterally, normal work of respiration, no wheezes, rales, rhonchi Heart: RRR, no murmurs, gallops, or rubs Abdomen: Soft, non-tender, non-distended, BS +. Left/periumbilical healing wound, ~1 cm x 1 cm. No erythema, tenderness, or drainage.  Extremities: No cyanosis, clubbing, or edema Neurologic: Alert & oriented x3,  cranial nerves II-XII intact, strength grossly intact, sensation intact to light touch  Assessment & Plan:   Please see problem-based assessment and plan.

## 2014-09-24 NOTE — Patient Instructions (Signed)
1. Please schedule a follow up appointment for 6 months.   We will schedule an appointment at the ID clinic for further management of your Hepatitis C.   We will also call you regarding an appointment for a colonoscopy.   2. Please take all medications as previously prescribed with the following changes:  CONTINUE HCTZ 12.5 mg daily  3. If you have worsening of your symptoms or new symptoms arise, please call the clinic [phone]), or go to the ER immediately if symptoms are severe.

## 2014-09-24 NOTE — Assessment & Plan Note (Addendum)
Discussed further management of his Chronic Hep C at length today. At previous visits, the patient was not interested in curative therapy, however today he would like to pursue this further. CT scan performed in 06/2014 (for follow up of NHL in remission) showed nodular contours of the liver compatible with cirrhosis, which had been shown on previous imaging. Also showed associated splenomegaly w/ a heterogeneous appearance of the spleen which is stable when compared to previous imaging. Feel that patient would be a great candidate for Harvoni, etc. Pending further workup.  -Refer to [facility] clinic; -Sent for the following labs;   -Hep C viral RNA qualitative   -Hepatitis C genotype  -CMP  -CBC   -INR  -ANA   -Hep A total Ab  -Hepatitits B surface Ab and Ag  -Hepatitis B core Ab total  -HIV Ab

## 2014-09-25 LAB — HEPATITIS A ANTIBODY, TOTAL: Hep A Total Ab: NONREACTIVE

## 2014-09-25 LAB — HEPATITIS B CORE ANTIBODY, TOTAL: Hep B Core Total Ab: NONREACTIVE

## 2014-09-25 LAB — HEPATITIS B SURFACE ANTIBODY,QUALITATIVE: Hep B S Ab: NEGATIVE

## 2014-09-25 LAB — HIV ANTIBODY (ROUTINE TESTING W REFLEX): HIV 1&2 Ab, 4th Generation: NONREACTIVE

## 2014-09-25 LAB — HEPATITIS B SURFACE ANTIGEN: Hepatitis B Surface Ag: NEGATIVE

## 2014-09-25 LAB — ANA: Anti Nuclear Antibody(ANA): NEGATIVE

## 2014-09-25 NOTE — Assessment & Plan Note (Signed)
BP Readings from Last 3 Encounters:  09/24/14 134/82  09/18/14 135/81  07/05/14 152/86    Lab Results  Component Value Date   NA 139 09/24/2014   K 4.4 09/24/2014   CREATININE 1.09 09/24/2014    Assessment: Comments: BP well controlled today, states he has been taking his HCTZ 12.5 mg daily, sister confirms this.   Plan: Medications:  continue current medications Educational resources provided: brochure (denies) Self management tools provided:   Other plans: RTC in 6 months. Low threshold to increase to 25 mg daily

## 2014-09-25 NOTE — Assessment & Plan Note (Signed)
Referral to GI for screening colonoscopy 

## 2014-09-25 NOTE — Assessment & Plan Note (Signed)
Followed at [facility]. Patient appears quite stable w/ regards to his schizophrenia. Taking Zyprexa. -Will send labs to [facility]

## 2014-09-25 NOTE — Progress Notes (Signed)
Internal Medicine Clinic Attending  Case discussed with Dr. Jones soon after the resident saw the patient.  We reviewed the resident's history and exam and pertinent patient test results.  I agree with the assessment, diagnosis, and plan of care documented in the resident's note. 

## 2014-09-26 LAB — HEPATITIS C RNA QUANTITATIVE
HCV Quantitative Log: 6.57 {Log} — ABNORMAL HIGH (ref <1.18)
HCV Quantitative: 3697093 IU/mL — ABNORMAL HIGH (ref <15)

## 2014-09-27 LAB — HEPATITIS C GENOTYPE

## 2014-09-27 NOTE — Addendum Note (Signed)
Addended by: Hulan Fray on: 09/27/2014 05:51 PM   Modules accepted: Orders

## 2014-11-06 ENCOUNTER — Ambulatory Visit (INDEPENDENT_AMBULATORY_CARE_PROVIDER_SITE_OTHER): Payer: Medicare Other | Admitting: Internal Medicine

## 2014-11-06 ENCOUNTER — Encounter: Payer: Self-pay | Admitting: Internal Medicine

## 2014-11-06 VITALS — BP 124/81 | HR 92 | Temp 97.9°F | Ht 65.5 in | Wt 225.0 lb

## 2014-11-06 DIAGNOSIS — B182 Chronic viral hepatitis C: Secondary | ICD-10-CM

## 2014-11-06 DIAGNOSIS — K7460 Unspecified cirrhosis of liver: Secondary | ICD-10-CM

## 2014-11-06 DIAGNOSIS — Z23 Encounter for immunization: Secondary | ICD-10-CM

## 2014-11-06 HISTORY — DX: Unspecified cirrhosis of liver: K74.60

## 2014-11-06 MED ORDER — LEDIPASVIR-SOFOSBUVIR 90-400 MG PO TABS: 1.0000 | ORAL_TABLET | Freq: Every day | ORAL | Status: DC

## 2014-11-06 NOTE — Progress Notes (Signed)
+[name] is a 54 y.o. male who presents for initial evaluation and management of a positive Hepatitis C antibody test.  Patient tested positive last year. Hepatitis C risk factors present are: IV drug abuse (details: used more than 10 years ago). Patient denies intranasal drug use, multiple sexual partners, renal dialysis, sexual contact with person with liver disease, tattoos. Patient has had other studies performed. Results: hepatitis C RNA by PCR, result: positive. Patient has not had prior treatment for Hepatitis C. Patient does not have a past history of liver disease. Patient does not have a family history of liver disease.   HPI: He was noted on CT scan to have cirrhosis and splenomegaly.  Has not seen GI.  Has a history of NHL, s/p chemo and doing well.    Patient does not have documented immunity to Hepatitis A. Patient does not have documented immunity to Hepatitis B.     Review of Systems A comprehensive review of systems was negative.   Past Medical History  Diagnosis Date  . Hepatitis C   . Hypertension   . Schizophrenia   . Hemorrhoids   . Rectal bleeding   . Non Hodgkin's lymphoma   . Cancer     lymphoma    Prior to Admission medications   Medication Sig Start Date End Date Taking? Authorizing Provider  benztropine (COGENTIN) 1 MG tablet Take 1 mg by mouth daily.   Yes Historical Provider, MD  hydrochlorothiazide (MICROZIDE) 12.5 MG capsule Take 1 capsule (12.5 mg total) by mouth daily. 09/24/14 09/24/15 Yes Corky Sox, MD  OLANZapine (ZYPREXA) 20 MG tablet Take 40 mg by mouth at bedtime.   Yes Historical Provider, MD  Ledipasvir-Sofosbuvir (HARVONI) 90-400 MG TABS Take 1 tablet by mouth daily. 11/06/14   Thayer Headings, MD    No Known Allergies  History  Substance Use Topics  . Smoking status: Former Smoker    Quit date: 04/26/1988  . Smokeless tobacco: Never Used  . Alcohol Use: No    No family history on file.    Objective:   Filed Vitals:   11/06/14 1347  BP: 124/81  Pulse: 92  Temp: 97.9 F (36.6 C)   in no apparent distress and alert HEENT: anicteric Cor RRR and No murmurs clear Bowel sounds are normal, liver is not enlarged, spleen is not enlarged peripheral pulses normal, no pedal edema, no clubbing or cyanosis negative for - jaundice, spider hemangioma, telangiectasia, palmar erythema, ecchymosis and atrophy  Laboratory Genotype:  Lab Results  Component Value Date   HCVGENOTYPE 1a 09/24/2014   HCV viral load:  Lab Results  Component Value Date   HCVQUANT [id number]* 09/24/2014   Lab Results  Component Value Date   WBC 5.3 09/24/2014   HGB 14.3 09/24/2014   HCT 40.5 09/24/2014   MCV 77.4* 09/24/2014   PLT 137* 09/24/2014    Lab Results  Component Value Date   CREATININE 1.09 09/24/2014   BUN 13 09/24/2014   NA 139 09/24/2014   K 4.4 09/24/2014   CL 102 09/24/2014   CO2 27 09/24/2014    Lab Results  Component Value Date   ALT 32 09/24/2014   AST 34 09/24/2014   ALKPHOS 84 09/24/2014   BILITOT 1.3* 09/24/2014   INR 1.16 09/24/2014      Assessment: Chronic Hepatitis C genotype 1a  Plan: 1) Patient counseled extensively on limiting acetaminophen to no more than 2 grams daily, avoidance of alcohol. 2) Transmission discussed with patient including  sexual transmission, sharing razors and toothbrush.   3) Will need referral to gastroenterology due to cirrhosis 4) Will need referral for substance abuse counseling: No. 5) Will prescribe Harvoni for 12 weeks  6) Hepatitis A vaccine Yes.   7) Hepatitis B vaccine Yes.   8) Pneumovax vaccine today 9) will follow up after starting medication

## 2014-11-06 NOTE — Patient Instructions (Signed)
Date 11/06/2014  Dear Mr. Charo, As discussed in the [facility], your hepatitis C therapy will include the following medications:          Harvoni 90mg /400mg  tablet:           Take 1 tablet by mouth once daily   Please note that ALL MEDICATIONS WILL START ON THE SAME DATE for a total of 12 weeks. ---------------------------------------------------------------- Your HCV Treatment Start Date: TBA   Your HCV genotype:  1a    Liver Fibrosis: cirrhosis    ---------------------------------------------------------------- YOUR PHARMACY CONTACT:   [facility] [place] Phone: [phone] Hours: Monday to Friday 7:30 am to 6:00 pm   Please always contact your pharmacy at least 3-4 business days before you run out of medications to ensure your next month's medication is ready or 1 week prior to running out if you receive it by mail.  Remember, each prescription is for 28 days. ---------------------------------------------------------------- GENERAL NOTES REGARDING YOUR HEPATITIS C MEDICATION:  SOFOSBUVIR/LEDIPASVIR (HARVONI): - Harvoni tablet is taken daily with OR without food. - The tablets are orange. - The tablets should be stored at room temperature.  - Acid reducing agents such as H2 blockers (ie. Pepcid (famotidine), Zantac (ranitidine), Tagamet (cimetidine), Axid (nizatidine) and proton pump inhibitors (ie. Prilosec (omeprazole), Protonix (pantoprazole), Nexium (esomeprazole), or Aciphex (rabeprazole)) can decrease effectiveness of Harvoni. Do not take until you have discussed with a health care provider.    -Antacids that contain magnesium and/or aluminum hydroxide (ie. Milk of Magensia, Rolaids, Gaviscon, Maalox, Mylanta, an dArthritis Pain Formula)can reduce absorption of Harvoni, so take them at least 4 hours before or after Harvoni.  -Calcium carbonate (calcium supplements or antacids such as Tums,  Caltrate, Os-Cal)needs to be taken at least 4 hours hours before or after Harvoni.  -St. John's wort or any products that contain St. John's wort like some herbal supplements  Please inform the office prior to starting any of these medications.  - The common side effects with Harvoni:      1. Fatigue      2. Headache      3. Nausea      4. Diarrhea      5. Insomnia   Support Path is a suite of resources designed to help patients start with HARVONI and move toward treatment completion [place] helps patients access therapy and get off to an efficient start  Benefits investigation and prior authorization support Co-pay and other financial assistance A specialty pharmacy finder CO-PAY COUPON The [place] co-pay coupon may help eligible patients lower their out-of-pocket costs. With a co-pay coupon, most eligible patients may pay no more than $5 per co-pay (restrictions apply) www.harvoni.com call 806-832-4604 Not valid for patients enrolled in government healthcare prescription drug programs, such as Medicare Part D and Medicaid. Patients in the coverage gap known as the "donut hole" also are not eligible The HARVONI co-pay coupon program will cover the out-of-pocket costs for HARVONI prescriptions up to a maximum of 25% of the catalog price of a 12-week regimen of HARVONI  Please note that this only lists the most common side effects and is NOT a comprehensive list of the potential side effects of these medications. For more information, please review the drug information sheets that come with your medication package from the pharmacy.  ---------------------------------------------------------------- GENERAL HELPFUL HINTS ON HCV THERAPY: 1. No alcohol. 2. Protect against sun-sensitivity/sunburns (wear sunglasses, hat, long sleeves, pants  and sunscreen). 3. Stay well-hydrated/well-moisturized. 4. Notify the ID Clinic of any changes in your other  over-the-counter/herbal or prescription medications. 5. If you miss a dose of your medication, take the missed dose as soon as you remember. Return to your regular time/dose schedule the next day.  6.  Do not stop taking your medications without first talking with your healthcare provider. 7.  You may take Tylenol (acetaminophen), as long as the dose is less than 2000 mg (OR no more than 4 tablets of the Tylenol Extra Strengths 500mg  tablet) in 24 hours. 8.  You will need to obtain routine labs and/or office visits at [facility] at weeks 4 and 12 as well as 12 and 24 weeks after completion of treatment.   Scharlene Gloss, [place] [facility] [place] [address], [place]  [phone]

## 2014-11-07 DIAGNOSIS — F209 Schizophrenia, unspecified: Secondary | ICD-10-CM | POA: Diagnosis not present

## 2014-12-19 ENCOUNTER — Telehealth: Payer: Self-pay | Admitting: *Deleted

## 2014-12-19 NOTE — Telephone Encounter (Signed)
Patient's mother called stating she takes care of all her son's medical concerns and had questions about when he would receive his Hep C meds. Asked to speak to the patient and she said he was not there. Asked if he had any mental challenges and she states, "yes he sees a psychiatrist". Advised her that we would need a signed release to speak with her further about her son's medical concerns. She said that she comes to all his appointments and she would be here in the morning to sign a medical release. She was advised that her son, [name] needs to be present to sign the medical release. I could not provide her any information as to where he is in the process of receiving the Hep C medication. She said she has not heard anything since appt in July. Myrtis Hopping

## 2014-12-20 NOTE — Telephone Encounter (Signed)
Thank you [name], however; Mother still needs to be aware that a medical release needs to be signed and placed in patient's chart for info on this patient.

## 2014-12-20 NOTE — Telephone Encounter (Signed)
Latif has been approved for [employer]. Called him an told him about it. He is going to pick up a [employer]. Med is ready.

## 2014-12-23 ENCOUNTER — Encounter: Payer: Self-pay | Admitting: Pharmacist Clinician (PhC)/ Clinical Pharmacy Specialist

## 2014-12-23 DIAGNOSIS — L739 Follicular disorder, unspecified: Secondary | ICD-10-CM | POA: Diagnosis not present

## 2014-12-23 DIAGNOSIS — B182 Chronic viral hepatitis C: Secondary | ICD-10-CM

## 2014-12-23 DIAGNOSIS — L309 Dermatitis, unspecified: Secondary | ICD-10-CM | POA: Diagnosis not present

## 2014-12-23 NOTE — Progress Notes (Signed)
Patient ID: [name], male   DOB: [birth year], 54 y.o.   MRN: [id number] [name] walked into the clinic with his sister. He just picked up the Harvoni at the pharmacy. Sat them down and go over everything again. Answered all of their questions. Going to bring them back in 4 wks for labs then Dr. Linus Salmons.

## 2014-12-24 ENCOUNTER — Encounter (HOSPITAL_COMMUNITY): Payer: Self-pay | Admitting: Pharmacy Technician

## 2015-01-15 ENCOUNTER — Ambulatory Visit: Payer: Medicare Other | Admitting: Podiatry

## 2015-01-23 ENCOUNTER — Other Ambulatory Visit: Payer: Medicare Other

## 2015-01-23 DIAGNOSIS — B20 Human immunodeficiency virus [HIV] disease: Secondary | ICD-10-CM | POA: Diagnosis not present

## 2015-01-23 DIAGNOSIS — B182 Chronic viral hepatitis C: Secondary | ICD-10-CM

## 2015-01-23 LAB — COMPREHENSIVE METABOLIC PANEL
ALT: 19 U/L (ref 9–46)
AST: 20 U/L (ref 10–35)
Albumin: 4.1 g/dL (ref 3.6–5.1)
Alkaline Phosphatase: 61 U/L (ref 40–115)
BUN: 6 mg/dL — ABNORMAL LOW (ref 7–25)
CO2: 24 mmol/L (ref 20–31)
Calcium: 9.4 mg/dL (ref 8.6–10.3)
Chloride: 106 mmol/L (ref 98–110)
Creat: 0.9 mg/dL (ref 0.70–1.33)
Glucose, Bld: 86 mg/dL (ref 65–99)
Potassium: 4 mmol/L (ref 3.5–5.3)
Sodium: 139 mmol/L (ref 135–146)
Total Bilirubin: 1 mg/dL (ref 0.2–1.2)
Total Protein: 7.8 g/dL (ref 6.1–8.1)

## 2015-01-24 LAB — HEPATITIS C RNA QUANTITATIVE
HCV Quantitative Log: 1.56 {Log} — ABNORMAL HIGH (ref <1.18)
HCV Quantitative: 36 IU/mL — ABNORMAL HIGH (ref <15)

## 2015-01-28 DIAGNOSIS — F209 Schizophrenia, unspecified: Secondary | ICD-10-CM | POA: Diagnosis not present

## 2015-01-30 ENCOUNTER — Ambulatory Visit (INDEPENDENT_AMBULATORY_CARE_PROVIDER_SITE_OTHER): Payer: Medicare Other | Admitting: Internal Medicine

## 2015-01-30 ENCOUNTER — Encounter: Payer: Self-pay | Admitting: Internal Medicine

## 2015-01-30 VITALS — BP 163/89 | HR 72 | Temp 97.4°F | Wt 220.0 lb

## 2015-01-30 DIAGNOSIS — Z23 Encounter for immunization: Secondary | ICD-10-CM

## 2015-01-30 DIAGNOSIS — K7460 Unspecified cirrhosis of liver: Secondary | ICD-10-CM | POA: Diagnosis not present

## 2015-01-30 DIAGNOSIS — B182 Chronic viral hepatitis C: Secondary | ICD-10-CM | POA: Diagnosis not present

## 2015-01-30 NOTE — Assessment & Plan Note (Signed)
Had pneumovax already.  After his next visit when he is done, I will refer him to GI for screening EGD.

## 2015-01-30 NOTE — Addendum Note (Signed)
Addended by: Myrtis Hopping A on: 01/30/2015 04:01 PM   Modules accepted: Orders

## 2015-01-30 NOTE — Assessment & Plan Note (Signed)
Doing great on treatment and will continue for 12 weeks.   rTC after treatment completion Hep B #2 today.

## 2015-01-30 NOTE — Progress Notes (Signed)
   Subjective:    Patient ID: [name], male    DOB: 03-20-1961, 54 y.o.   MRN: [id number]  HPI Here for follow up of HCV.  Genotype 1a, viral load 3.6 million, hepatitis A and B non-immune.  Started on Harvoni and tolerating well with no fatigue, no headache.  Just started his second bottle.  Viral load down to 36 copies.  Pleased with response to treatment.   Review of Systems  Constitutional: Negative for fatigue.  Gastrointestinal: Negative for nausea and diarrhea.  Skin: Negative for rash.  Neurological: Negative for dizziness and light-headedness.       Objective:   Physical Exam  Constitutional: He appears well-developed and well-nourished. No distress.  Eyes: No scleral icterus.  Cardiovascular: Normal rate, regular rhythm and normal heart sounds.   No murmur heard. Pulmonary/Chest: Effort normal and breath sounds normal. No respiratory distress.  Skin: No rash noted.          Assessment & Plan:

## 2015-03-06 DIAGNOSIS — F209 Schizophrenia, unspecified: Secondary | ICD-10-CM | POA: Diagnosis not present

## 2015-03-10 ENCOUNTER — Other Ambulatory Visit: Payer: Self-pay | Admitting: Internal Medicine

## 2015-04-15 ENCOUNTER — Ambulatory Visit (INDEPENDENT_AMBULATORY_CARE_PROVIDER_SITE_OTHER): Payer: Medicare Other | Admitting: Internal Medicine

## 2015-04-15 ENCOUNTER — Encounter: Payer: Self-pay | Admitting: Internal Medicine

## 2015-04-15 VITALS — HR 67 | Temp 97.7°F | Wt 218.0 lb

## 2015-04-15 DIAGNOSIS — B182 Chronic viral hepatitis C: Secondary | ICD-10-CM | POA: Diagnosis not present

## 2015-04-15 DIAGNOSIS — K7460 Unspecified cirrhosis of liver: Secondary | ICD-10-CM | POA: Diagnosis present

## 2015-04-15 NOTE — Assessment & Plan Note (Signed)
[place] screen and will do every 6 months.  Also will refer to GI for varices screening.

## 2015-04-15 NOTE — Progress Notes (Signed)
   Subjective:    Patient ID: [name], male    DOB: [birth year], 54 y.o.   MRN: [id number]  HPI Here for follow up of HCV.   Has genotype 1a, with an initial viral load of 36 million, hepatitis A and B non-immune and a history of cirrhosis noted on CT scan.  Had pneumococcal vaccine in July.  Now has completed 12 weeks of Harvoni.  No new issues.     Review of Systems  Constitutional: Negative for fatigue.  Neurological: Negative for headaches.       Objective:   Physical Exam  Constitutional: He appears well-developed and well-nourished. No distress.  Eyes: No scleral icterus.  Cardiovascular: Normal rate, regular rhythm and normal heart sounds.   No murmur heard. Skin: No rash noted.          Assessment & Plan:

## 2015-04-15 NOTE — Assessment & Plan Note (Signed)
Doing well. End of treatment lab today and rtc 4 months for [facility]

## 2015-04-16 LAB — HEPATITIS C RNA QUANTITATIVE: HCV Quantitative: NOT DETECTED IU/mL (ref <15)

## 2015-04-24 ENCOUNTER — Ambulatory Visit
Admission: RE | Admit: 2015-04-24 | Discharge: 2015-04-24 | Disposition: A | Discharge status: Home / Self Care | Payer: Medicare Other | Source: Ambulatory Visit | Attending: Internal Medicine | Admitting: Internal Medicine

## 2015-04-24 DIAGNOSIS — K7460 Unspecified cirrhosis of liver: Secondary | ICD-10-CM

## 2015-04-25 ENCOUNTER — Other Ambulatory Visit: Payer: Self-pay | Admitting: Internal Medicine

## 2015-04-25 DIAGNOSIS — K7460 Unspecified cirrhosis of liver: Secondary | ICD-10-CM

## 2015-04-29 ENCOUNTER — Telehealth: Payer: Self-pay | Admitting: *Deleted

## 2015-04-29 NOTE — Telephone Encounter (Signed)
Patient scheduled for MRI at [facility] on 1/11, 9:00.  He is to arrive at 8:00 for bloodwork.  Patient should be NPO for 4 hours prior.  RN left message with information, call back number with any questions. Patient has medicare (red, white and blue card), medicaid back up. No prior authorization required. Landis Gandy, RN

## 2015-04-29 NOTE — Telephone Encounter (Signed)
-----   Message from Thayer Headings, MD sent at 04/25/2015  2:20 PM EST ----- Based on his ultrasound, I would like to get an MRI of his liver.  I ordered it.  Likely needs prior auth. thanks

## 2015-05-01 NOTE — Telephone Encounter (Signed)
Patient is aware of this appointment

## 2015-05-07 ENCOUNTER — Telehealth: Payer: Self-pay | Admitting: *Deleted

## 2015-05-07 ENCOUNTER — Ambulatory Visit (HOSPITAL_COMMUNITY)
Admission: RE | Admit: 2015-05-07 | Discharge: 2015-05-07 | Disposition: A | Discharge status: Home / Self Care | Payer: Medicare Other | Source: Ambulatory Visit | Attending: Internal Medicine | Admitting: Internal Medicine

## 2015-05-07 DIAGNOSIS — K739 Chronic hepatitis, unspecified: Secondary | ICD-10-CM | POA: Diagnosis not present

## 2015-05-07 DIAGNOSIS — R161 Splenomegaly, not elsewhere classified: Secondary | ICD-10-CM | POA: Diagnosis not present

## 2015-05-07 DIAGNOSIS — K7460 Unspecified cirrhosis of liver: Secondary | ICD-10-CM | POA: Diagnosis present

## 2015-05-07 LAB — CREATININE, SERUM
Creatinine, Ser: 0.96 mg/dL (ref 0.61–1.24)
GFR calc Af Amer: >60 mL/min (ref >60)
GFR calc non Af Amer: >60 mL/min (ref >60)

## 2015-05-07 MED ORDER — GADOXETATE DISODIUM 0.25 MMOL/ML IV SOLN
10.0000 mL | Freq: Once | INTRAVENOUS | Status: AC | PRN
  Administered 2015-05-07: 10 mL via INTRAVENOUS

## 2015-05-07 NOTE — Telephone Encounter (Signed)
Left message with information, also reminded of patient's injection appointment 1/12. Landis Gandy, RN

## 2015-05-07 NOTE — Telephone Encounter (Signed)
-----   Message from Thayer Headings, MD sent at 05/07/2015 12:14 PM EST ----- Please let him know his MRI of the liver looks fine, no issues other than the known cirrhosis.  Thanks

## 2015-05-08 ENCOUNTER — Ambulatory Visit (INDEPENDENT_AMBULATORY_CARE_PROVIDER_SITE_OTHER): Payer: Medicare Other | Admitting: *Deleted

## 2015-05-08 DIAGNOSIS — Z23 Encounter for immunization: Secondary | ICD-10-CM | POA: Diagnosis not present

## 2015-05-16 ENCOUNTER — Encounter: Payer: Self-pay | Admitting: Internal Medicine

## 2015-05-22 DIAGNOSIS — F209 Schizophrenia, unspecified: Secondary | ICD-10-CM | POA: Diagnosis not present

## 2015-07-01 ENCOUNTER — Other Ambulatory Visit (HOSPITAL_BASED_OUTPATIENT_CLINIC_OR_DEPARTMENT_OTHER): Payer: Medicare Other

## 2015-07-01 ENCOUNTER — Ambulatory Visit (HOSPITAL_COMMUNITY)
Admission: RE | Admit: 2015-07-01 | Discharge: 2015-07-01 | Disposition: A | Discharge status: Home / Self Care | Payer: Medicare Other | Source: Ambulatory Visit | Attending: Internal Medicine | Admitting: Internal Medicine

## 2015-07-01 DIAGNOSIS — R161 Splenomegaly, not elsewhere classified: Secondary | ICD-10-CM | POA: Diagnosis not present

## 2015-07-01 DIAGNOSIS — R590 Localized enlarged lymph nodes: Secondary | ICD-10-CM | POA: Insufficient documentation

## 2015-07-01 DIAGNOSIS — C8590 Non-Hodgkin lymphoma, unspecified, unspecified site: Secondary | ICD-10-CM

## 2015-07-01 DIAGNOSIS — K766 Portal hypertension: Secondary | ICD-10-CM | POA: Insufficient documentation

## 2015-07-01 DIAGNOSIS — R911 Solitary pulmonary nodule: Secondary | ICD-10-CM | POA: Insufficient documentation

## 2015-07-01 DIAGNOSIS — K7460 Unspecified cirrhosis of liver: Secondary | ICD-10-CM | POA: Insufficient documentation

## 2015-07-01 DIAGNOSIS — Z8572 Personal history of non-Hodgkin lymphomas: Secondary | ICD-10-CM | POA: Diagnosis not present

## 2015-07-01 LAB — COMPREHENSIVE METABOLIC PANEL
ALT: 18 U/L (ref 0–55)
AST: 24 U/L (ref 5–34)
Albumin: 3.9 g/dL (ref 3.5–5.0)
Alkaline Phosphatase: 65 U/L (ref 40–150)
Anion Gap: 11 mEq/L (ref 3–11)
BUN: 12.7 mg/dL (ref 7.0–26.0)
CO2: 24 mEq/L (ref 22–29)
Calcium: 9.4 mg/dL (ref 8.4–10.4)
Chloride: 103 mEq/L (ref 98–109)
Creatinine: 1.1 mg/dL (ref 0.7–1.3)
EGFR: 86 mL/min/{1.73_m2} — ABNORMAL LOW (ref >90)
Glucose: 106 mg/dl (ref 70–140)
Potassium: 4.5 mEq/L (ref 3.5–5.1)
Sodium: 138 mEq/L (ref 136–145)
Total Bilirubin: 1.05 mg/dL (ref 0.20–1.20)
Total Protein: 8.5 g/dL — ABNORMAL HIGH (ref 6.4–8.3)

## 2015-07-01 LAB — CBC WITH DIFFERENTIAL/PLATELET
BASO%: 0.5 % (ref 0.0–2.0)
Basophils Absolute: 0 10*3/uL (ref 0.0–0.1)
EOS%: 0.5 % (ref 0.0–7.0)
Eosinophils Absolute: 0 10*3/uL (ref 0.0–0.5)
HCT: 42.5 % (ref 38.4–49.9)
HGB: 13.9 g/dL (ref 13.0–17.1)
LYMPH%: 26.2 % (ref 14.0–49.0)
MCH: 25.6 pg — ABNORMAL LOW (ref 27.2–33.4)
MCHC: 32.6 g/dL (ref 32.0–36.0)
MCV: 78.5 fL — ABNORMAL LOW (ref 79.3–98.0)
MONO#: 1.2 10*3/uL — ABNORMAL HIGH (ref 0.1–0.9)
MONO%: 21.2 % — ABNORMAL HIGH (ref 0.0–14.0)
NEUT#: 2.8 10*3/uL (ref 1.5–6.5)
NEUT%: 51.6 % (ref 39.0–75.0)
Platelets: 124 10*3/uL — ABNORMAL LOW (ref 140–400)
RBC: 5.42 10*6/uL (ref 4.20–5.82)
RDW: 13.3 % (ref 11.0–14.6)
WBC: 5.5 10*3/uL (ref 4.0–10.3)
lymph#: 1.4 10*3/uL (ref 0.9–3.3)

## 2015-07-01 LAB — LACTATE DEHYDROGENASE: LDH: 174 U/L (ref 125–245)

## 2015-07-01 MED ORDER — IOHEXOL 300 MG/ML  SOLN
100.0000 mL | Freq: Once | INTRAMUSCULAR | Status: AC | PRN
  Administered 2015-07-01: 100 mL via INTRAVENOUS

## 2015-07-08 ENCOUNTER — Encounter: Payer: Self-pay | Admitting: Internal Medicine

## 2015-07-08 ENCOUNTER — Telehealth: Payer: Self-pay | Admitting: Internal Medicine

## 2015-07-08 ENCOUNTER — Ambulatory Visit (HOSPITAL_BASED_OUTPATIENT_CLINIC_OR_DEPARTMENT_OTHER): Payer: Medicare Other | Admitting: Internal Medicine

## 2015-07-08 VITALS — BP 139/71 | HR 79 | Temp 97.8°F | Resp 18 | Ht 65.5 in | Wt 222.5 lb

## 2015-07-08 DIAGNOSIS — Z8572 Personal history of non-Hodgkin lymphomas: Secondary | ICD-10-CM

## 2015-07-08 DIAGNOSIS — C8338 Diffuse large B-cell lymphoma, lymph nodes of multiple sites: Secondary | ICD-10-CM

## 2015-07-08 DIAGNOSIS — R911 Solitary pulmonary nodule: Secondary | ICD-10-CM | POA: Insufficient documentation

## 2015-07-08 HISTORY — DX: Solitary pulmonary nodule: R91.1

## 2015-07-08 NOTE — Telephone Encounter (Signed)
per pof to sh pt appt-adv central sch will call to sch trmt-gave pt contrast

## 2015-07-08 NOTE — Progress Notes (Signed)
[place] Telephone:([phone]) [phone]   Fax:([phone]) [phone]  OFFICE PROGRESS NOTE  PRINCIPAL DIAGNOSIS: Stage IV non-Hodgkin lymphoma diagnosed in March 2009.   PRIOR THERAPY:  1. Status post 4 weekly doses of Rituxan during the patient's hospitalization at the intensive care unit at [facility] with respiratory failure. 2. Status post 7 cycles of systemic chemotherapy with CHOP/Rituxan. Last dose was given January 15, 2008.  CURRENT THERAPY: Observation.  INTERVAL HISTORY: Erskine Rudow 55 y.o. male returns to the clinic today for annual followup visit accompanied by his sister. The patient is feeling fine today with no specific complaints. He recently underwent treatment for hepatitis C. He denied having any significant weight loss or night sweats. He gained several pounds since his last visit. He has no palpable lymphadenopathy. He denied having any significant chest pain, shortness of breath, cough or hemoptysis. The patient had repeat CT scan of the chest, abdomen and pelvis performed recently and he is here for evaluation and discussion of his scan results.  MEDICAL HISTORY: Past Medical History  Diagnosis Date  . Hepatitis C   . Hypertension   . Schizophrenia ([place])   . Hemorrhoids   . Rectal bleeding   . Non Hodgkin's lymphoma ([place])   . Cancer ([place])     lymphoma    ALLERGIES:  has No Known Allergies.  MEDICATIONS:  Current Outpatient Prescriptions  Medication Sig Dispense Refill  . benztropine (COGENTIN) 1 MG tablet Take 1 mg by mouth daily.    Marland Kitchen gabapentin (NEURONTIN) 300 MG capsule Take 300 mg by mouth 3 (three) times daily.    . hydrochlorothiazide (MICROZIDE) 12.5 MG capsule Take 1 capsule (12.5 mg total) by mouth daily. 30 capsule 11  . OLANZapine (ZYPREXA) 20 MG tablet Take 40 mg by mouth at bedtime.    Marland Kitchen perphenazine (TRILAFON) 8 MG tablet Take 8 mg by mouth 2 (two) times daily.     No current facility-administered medications for this  visit.    REVIEW OF SYSTEMS:  A comprehensive review of systems was negative.   PHYSICAL EXAMINATION: General appearance: alert, cooperative and no distress Head: Normocephalic, without obvious abnormality, atraumatic Neck: no adenopathy Lymph nodes: Cervical, supraclavicular, and axillary nodes normal. Resp: clear to auscultation bilaterally Cardio: regular rate and rhythm, S1, S2 normal, no murmur, click, rub or gallop GI: soft, non-tender; bowel sounds normal; no masses,  no organomegaly Extremities: extremities normal, atraumatic, no cyanosis or edema  ECOG PERFORMANCE STATUS: 0 - Asymptomatic  Blood pressure 139/71, pulse 79, temperature 97.8 F (36.6 C), temperature source Oral, resp. rate 18, height 5' 5.5" (1.664 m), weight 222 lb 8 oz (100.925 kg), SpO2 99 %.  LABORATORY DATA: Lab Results  Component Value Date   WBC 5.5 07/01/2015   HGB 13.9 07/01/2015   HCT 42.5 07/01/2015   MCV 78.5* 07/01/2015   PLT 124* 07/01/2015      Chemistry      Component Value Date/Time   NA 138 07/01/2015 0753   NA 139 01/23/2015 1436   NA 139 12/18/2010 1219   K 4.5 07/01/2015 0753   K 4.0 01/23/2015 1436   K 4.5 12/18/2010 1219   CL 106 01/23/2015 1436   CL 107 06/20/2012 1032   CL 103 12/18/2010 1219   CO2 24 07/01/2015 0753   CO2 24 01/23/2015 1436   CO2 26 12/18/2010 1219   BUN 12.7 07/01/2015 0753   BUN 6* 01/23/2015 1436   BUN 9 12/18/2010 1219  CREATININE 1.1 07/01/2015 0753   CREATININE 0.96 05/07/2015 0830   CREATININE 0.90 01/23/2015 1436      Component Value Date/Time   CALCIUM 9.4 07/01/2015 0753   CALCIUM 9.4 01/23/2015 1436   CALCIUM 8.8 12/18/2010 1219   ALKPHOS 65 07/01/2015 0753   ALKPHOS 61 01/23/2015 1436   ALKPHOS 104* 12/18/2010 1219   AST 24 07/01/2015 0753   AST 20 01/23/2015 1436   AST 145* 12/18/2010 1219   ALT 18 07/01/2015 0753   ALT 19 01/23/2015 1436   ALT 82* 12/18/2010 1219   BILITOT 1.05 07/01/2015 0753   BILITOT 1.0 01/23/2015 1436    BILITOT 2.40* 12/18/2010 1219       RADIOGRAPHIC STUDIES: Ct Chest W Contrast  07/01/2015  CLINICAL DATA:  Non-Hodgkin's lymphoma.  Chemotherapy. EXAM: CT CHEST, ABDOMEN, AND PELVIS WITH CONTRAST TECHNIQUE: Multidetector CT imaging of the chest, abdomen and pelvis was performed following the standard protocol during bolus administration of intravenous contrast. CONTRAST:  154mL OMNIPAQUE IOHEXOL 300 MG/ML  SOLN COMPARISON:  None. FINDINGS: CT CHEST FINDINGS Mediastinum/Lymph Nodes: No masses, pathologically enlarged lymph nodes, or other significant abnormality. Lungs/Pleura: No pleural effusion identified. In the right middle lobe there is a nodule which measures 9 mm, image 29 of series 4. This is new when compared with previous exam. The tiny nodule within the right lower lobe is unchanged, image 34 of series 4. Biapical changes of emphysema no Musculoskeletal: No chest wall mass or suspicious bone lesions identified. CT ABDOMEN PELVIS FINDINGS Hepatobiliary: The liver appears cirrhotic. No focal liver abnormality noted. The gallbladder appears normal. No biliary dilatation. Pancreas: No mass, inflammatory changes, or other significant abnormality. Spleen: The spleen measures 14 cm in length, image 84 of series 602. This is unchanged from the previous exam containing a few areas of low-attenuation. Adrenals/Urinary Tract: No masses identified. No evidence of hydronephrosis. Stomach/Bowel: No evidence of obstruction, inflammatory process, or abnormal fluid collections. Vascular/Lymphatic: No pathologically enlarged lymph nodes. No evidence of abdominal aortic aneurysm. Re- cannulization of the umbilical vein is identified compatible with portal venous hypertension. Left inguinal lymph node is enlarged measuring 1.7 cm short axis, image 122 of series 2. Previously this measured 2 cm. Reproductive: No mass or other significant abnormality. Other: There is a periumbilical hernia containing fat only. There is  no ascites or focal fluid collections within the abdomen or pelvis. Musculoskeletal:  No suspicious bone lesions identified. IMPRESSION: 1. There is a new 9 mm pulmonary nodule identified within the right middle lobe compared with 06/25/2014. 2. Stable appearance of enlarged left inguinal lymph node. 3. Cirrhosis and portal venous hypertension 4. Splenomegaly. Electronically Signed   By: Kerby Moors M.D.   On: 07/01/2015 11:05   Ct Abdomen Pelvis W Contrast  07/01/2015  CLINICAL DATA:  Non-Hodgkin's lymphoma.  Chemotherapy. EXAM: CT CHEST, ABDOMEN, AND PELVIS WITH CONTRAST TECHNIQUE: Multidetector CT imaging of the chest, abdomen and pelvis was performed following the standard protocol during bolus administration of intravenous contrast. CONTRAST:  159mL OMNIPAQUE IOHEXOL 300 MG/ML  SOLN COMPARISON:  None. FINDINGS: CT CHEST FINDINGS Mediastinum/Lymph Nodes: No masses, pathologically enlarged lymph nodes, or other significant abnormality. Lungs/Pleura: No pleural effusion identified. In the right middle lobe there is a nodule which measures 9 mm, image 29 of series 4. This is new when compared with previous exam. The tiny nodule within the right lower lobe is unchanged, image 34 of series 4. Biapical changes of emphysema no Musculoskeletal: No chest wall mass or suspicious bone lesions  identified. CT ABDOMEN PELVIS FINDINGS Hepatobiliary: The liver appears cirrhotic. No focal liver abnormality noted. The gallbladder appears normal. No biliary dilatation. Pancreas: No mass, inflammatory changes, or other significant abnormality. Spleen: The spleen measures 14 cm in length, image 84 of series 602. This is unchanged from the previous exam containing a few areas of low-attenuation. Adrenals/Urinary Tract: No masses identified. No evidence of hydronephrosis. Stomach/Bowel: No evidence of obstruction, inflammatory process, or abnormal fluid collections. Vascular/Lymphatic: No pathologically enlarged lymph nodes. No  evidence of abdominal aortic aneurysm. Re- cannulization of the umbilical vein is identified compatible with portal venous hypertension. Left inguinal lymph node is enlarged measuring 1.7 cm short axis, image 122 of series 2. Previously this measured 2 cm. Reproductive: No mass or other significant abnormality. Other: There is a periumbilical hernia containing fat only. There is no ascites or focal fluid collections within the abdomen or pelvis. Musculoskeletal:  No suspicious bone lesions identified. IMPRESSION: 1. There is a new 9 mm pulmonary nodule identified within the right middle lobe compared with 06/25/2014. 2. Stable appearance of enlarged left inguinal lymph node. 3. Cirrhosis and portal venous hypertension 4. Splenomegaly. Electronically Signed   By: Kerby Moors M.D.   On: 07/01/2015 11:05    ASSESSMENT AND PLAN: This is a very pleasant 55 years old [place] American male with history of stage IV non-Hodgkin lymphoma diagnosed in March of 2009 status post systemic chemotherapy and has been observation since September of 2009 with no evidence for disease recurrence. The only concerning finding on the scan is a new 9 mm right middle lobe pulmonary nodule. I discussed the scan results with the patient today. I recommended for him to continue on observation with repeat CT scan of the chest, abdomen and pelvis in 6 months for reevaluation of his disease because of the new pulmonary nodule.  The patient was advised to call me immediately if he has any concerning symptoms in the interval.  All questions were answered. The patient knows to call the clinic with any problems, questions or concerns. We can certainly see the patient much sooner if necessary.  Disclaimer: This note was dictated with voice recognition software. Similar sounding words can inadvertently be transcribed and may not be corrected upon review.

## 2015-07-18 ENCOUNTER — Telehealth: Payer: Self-pay | Admitting: Internal Medicine

## 2015-07-18 NOTE — Telephone Encounter (Signed)
Returning patient's call.  NO answer LMOM.

## 2015-08-12 DIAGNOSIS — F209 Schizophrenia, unspecified: Secondary | ICD-10-CM | POA: Diagnosis not present

## 2015-08-14 ENCOUNTER — Encounter: Payer: Self-pay | Admitting: Gastroenterology

## 2015-08-14 ENCOUNTER — Encounter: Payer: Self-pay | Admitting: Internal Medicine

## 2015-08-14 ENCOUNTER — Ambulatory Visit (INDEPENDENT_AMBULATORY_CARE_PROVIDER_SITE_OTHER): Payer: Medicare Other | Admitting: Internal Medicine

## 2015-08-14 VITALS — BP 145/85 | HR 78 | Temp 97.7°F | Wt 225.0 lb

## 2015-08-14 DIAGNOSIS — E663 Overweight: Secondary | ICD-10-CM | POA: Insufficient documentation

## 2015-08-14 DIAGNOSIS — E669 Obesity, unspecified: Secondary | ICD-10-CM

## 2015-08-14 DIAGNOSIS — K7460 Unspecified cirrhosis of liver: Secondary | ICD-10-CM

## 2015-08-14 DIAGNOSIS — Z6836 Body mass index (BMI) 36.0-36.9, adult: Secondary | ICD-10-CM

## 2015-08-14 DIAGNOSIS — B182 Chronic viral hepatitis C: Secondary | ICD-10-CM | POA: Diagnosis not present

## 2015-08-14 NOTE — Assessment & Plan Note (Signed)
Discussed weight loss, excercise

## 2015-08-14 NOTE — Assessment & Plan Note (Signed)
Will refer again to GI for ? EGD.   Will repeat an [place] in 5 months for [facility] screening.  Gets yearly CT by Dr. Julien Nordmann including abd which is good for screening then.

## 2015-08-14 NOTE — Assessment & Plan Note (Signed)
SVR12 today for confirmation of cure.

## 2015-08-14 NOTE — Progress Notes (Signed)
   Subjective:    Patient ID: [name], male    DOB: 11-Dec-1960, 55 y.o.   MRN: [id number]  HPI Here for follow up of HCV.   Has genotype 1a, with an initial viral load of 36 million, hepatitis A and B non-immune and a history of cirrhosis noted on CT scan.  Had pneumococcal vaccine in July.  Now here for SVR12.  No new issues.  Has not yet been to GI.  Had a colonoscopy many years ago but does not remember who or where, but was in [place].  No alcohol.  Had recent CT including the abd and no mass.   Asking about diet modifications.    Review of Systems  Constitutional: Negative for fatigue.  Neurological: Negative for headaches.       Objective:   Physical Exam  Constitutional: He appears well-developed and well-nourished. No distress.  Eyes: No scleral icterus.  Cardiovascular: Normal rate, regular rhythm and normal heart sounds.   No murmur heard. Skin: No rash noted.   Social History   Social History  . Marital Status: Single    Spouse Name: N/A  . Number of Children: N/A  . Years of Education: N/A   Occupational History  . Not on file.   Social History Main Topics  . Smoking status: Former Smoker    Quit date: 04/26/1988  . Smokeless tobacco: Never Used  . Alcohol Use: No  . Drug Use: No  . Sexual Activity: Not on file   Other Topics Concern  . Not on file   Social History Narrative         Assessment & Plan:

## 2015-08-18 LAB — HEPATITIS C RNA QUANTITATIVE: HCV Quantitative: NOT DETECTED IU/mL (ref <15)

## 2015-10-14 ENCOUNTER — Encounter: Payer: Self-pay | Admitting: Gastroenterology

## 2015-10-14 ENCOUNTER — Ambulatory Visit (INDEPENDENT_AMBULATORY_CARE_PROVIDER_SITE_OTHER): Payer: Medicare Other | Admitting: Gastroenterology

## 2015-10-14 ENCOUNTER — Other Ambulatory Visit (INDEPENDENT_AMBULATORY_CARE_PROVIDER_SITE_OTHER): Payer: Medicare Other

## 2015-10-14 VITALS — BP 126/80 | HR 80 | Ht 65.5 in | Wt 221.0 lb

## 2015-10-14 DIAGNOSIS — I864 Gastric varices: Secondary | ICD-10-CM

## 2015-10-14 DIAGNOSIS — Z1211 Encounter for screening for malignant neoplasm of colon: Secondary | ICD-10-CM

## 2015-10-14 DIAGNOSIS — K7469 Other cirrhosis of liver: Secondary | ICD-10-CM | POA: Diagnosis not present

## 2015-10-14 DIAGNOSIS — R978 Other abnormal tumor markers: Secondary | ICD-10-CM | POA: Diagnosis not present

## 2015-10-14 LAB — COMPREHENSIVE METABOLIC PANEL
ALT: 17 U/L (ref 0–53)
AST: 17 U/L (ref 0–37)
Albumin: 4.3 g/dL (ref 3.5–5.2)
Alkaline Phosphatase: 59 U/L (ref 39–117)
BUN: 12 mg/dL (ref 6–23)
CO2: 29 mEq/L (ref 19–32)
Calcium: 9.8 mg/dL (ref 8.4–10.5)
Chloride: 104 mEq/L (ref 96–112)
Creatinine, Ser: 0.96 mg/dL (ref 0.40–1.50)
GFR: 103.86 mL/min (ref >60.00)
Glucose, Bld: 100 mg/dL — ABNORMAL HIGH (ref 70–99)
Potassium: 4.3 mEq/L (ref 3.5–5.1)
Sodium: 139 mEq/L (ref 135–145)
Total Bilirubin: 1.1 mg/dL (ref 0.2–1.2)
Total Protein: 8.2 g/dL (ref 6.0–8.3)

## 2015-10-14 LAB — CBC WITH DIFFERENTIAL/PLATELET
Basophils Absolute: 0 10*3/uL (ref 0.0–0.1)
Basophils Relative: 0.3 % (ref 0.0–3.0)
Eosinophils Absolute: 0.1 10*3/uL (ref 0.0–0.7)
Eosinophils Relative: 1.1 % (ref 0.0–5.0)
HCT: 40.3 % (ref 39.0–52.0)
Hemoglobin: 13.5 g/dL (ref 13.0–17.0)
Lymphocytes Relative: 23.1 % (ref 12.0–46.0)
Lymphs Abs: 1.3 10*3/uL (ref 0.7–4.0)
MCHC: 33.5 g/dL (ref 30.0–36.0)
MCV: 77.7 fl — ABNORMAL LOW (ref 78.0–100.0)
Monocytes Absolute: 0.6 10*3/uL (ref 0.1–1.0)
Monocytes Relative: 10.1 % (ref 3.0–12.0)
Neutro Abs: 3.6 10*3/uL (ref 1.4–7.7)
Neutrophils Relative %: 65.4 % (ref 43.0–77.0)
Platelets: 143 10*3/uL — ABNORMAL LOW (ref 150.0–400.0)
RBC: 5.18 Mil/uL (ref 4.22–5.81)
RDW: 13.5 % (ref 11.5–15.5)
WBC: 5.5 10*3/uL (ref 4.0–10.5)

## 2015-10-14 LAB — IBC PANEL
Iron: 59 ug/dL (ref 42–165)
Saturation Ratios: 13.8 % — ABNORMAL LOW (ref 20.0–50.0)
Transferrin: 306 mg/dL (ref 212.0–360.0)

## 2015-10-14 LAB — PROTIME-INR
INR: 1.1 ratio — ABNORMAL HIGH (ref 0.8–1.0)
Prothrombin Time: 11.9 s (ref 9.6–13.1)

## 2015-10-14 LAB — FERRITIN: Ferritin: 313.9 ng/mL (ref 22.0–322.0)

## 2015-10-14 MED ORDER — NA SULFATE-K SULFATE-MG SULF 17.5-3.13-1.6 GM/177ML PO SOLN: 1.0000 | Freq: Once | ORAL | Status: DC

## 2015-10-14 NOTE — Progress Notes (Signed)
HPI: This is a very pleasant 55 year old man whom I'm meeting for the first time today. He was referred by Dr. Bradd Burner from infectious disease for cirrhosis  Chief complaint is cirrhosis   Does not drink etoh, none in 30 years, but was abuser in his 86s.  No FH of liver disease  No FH of colon cancer.  No overt bleeding.   history of stage IV non-Hodgkin lymphoma diagnosed in March of 2009 status post systemic chemotherapy and has been observation since September of 2009 with no evidence for disease recurrence.   Genotype 1a hep C, eradicated with SVR based on no virus detected 2 months ago and 6 months ago.  CT scan 06/2015: 1. There is a new 9 mm pulmonary nodule identified within the right middle lobe compared with 06/25/2014. 2. Stable appearance of enlarged left inguinal lymph node. 3. Cirrhosis and portal venous hypertension 4. Splenomegaly.  MRI 04/2015 1. Hepatic cirrhosis with subcapsular scarring, but no focallysuspicious hepatic lesions.2. Splenomegaly with multiple sclerotic nodules. No other signs ofportal hypertension. 3. No acute abdominal findings.  Labs 08/2014 hep B surface Ab negative, hep B surface Ag negative, hep A total Ab negative; INR in 2016 was normal. Liver tests most recently were normal. CBC shows slightly low platelets around 120.   Review of systems: Pertinent positive and negative review of systems were noted in the above HPI section. Complete review of systems was performed and was otherwise normal.   Past Medical History  Diagnosis Date  . Hepatitis C   . Hypertension   . Schizophrenia ([place])   . Hemorrhoids   . Rectal bleeding   . Non Hodgkin's lymphoma ([place])   . Cancer ([place])     lymphoma  . Pulmonary nodule 07/08/2015    History reviewed. No pertinent past surgical history.  Current Outpatient Prescriptions  Medication Sig Dispense Refill  . benztropine (COGENTIN) 1 MG tablet Take 1 mg by mouth daily.    Marland Kitchen gabapentin (NEURONTIN) 300 MG  capsule Take 300 mg by mouth 3 (three) times daily.    Marland Kitchen OLANZapine (ZYPREXA) 20 MG tablet Take 40 mg by mouth at bedtime.    Marland Kitchen perphenazine (TRILAFON) 8 MG tablet Take 8 mg by mouth 2 (two) times daily.    . Na Sulfate-K Sulfate-Mg Sulf 17.5-3.13-1.6 GM/180ML SOLN Take 1 kit by mouth once. 354 mL 0   No current facility-administered medications for this visit.    Allergies as of 10/14/2015  . (No Known Allergies)    History reviewed. No pertinent family history.  Social History   Social History  . Marital Status: Single    Spouse Name: N/A  . Number of Children: N/A  . Years of Education: N/A   Occupational History  . Not on file.   Social History Main Topics  . Smoking status: Former Smoker    Quit date: 04/26/1988  . Smokeless tobacco: Never Used  . Alcohol Use: No  . Drug Use: No  . Sexual Activity: Not on file   Other Topics Concern  . Not on file   Social History Narrative     Physical Exam: BP 126/80 mmHg  Pulse 80  Ht 5' 5.5" (1.664 m)  Wt 221 lb (100.245 kg)  BMI 36.20 kg/m2 Constitutional: generally well-appearing Psychiatric: alert and oriented x3 Eyes: extraocular movements intact Mouth: oral pharynx moist, no lesions Neck: supple no lymphadenopathy Cardiovascular: heart regular rate and rhythm Lungs: clear to auscultation bilaterally Abdomen: soft, nontender, nondistended, no obvious ascites, no  peritoneal signs, normal bowel sounds Extremities: no lower extremity edema bilaterally Skin: no lesions on visible extremities   Assessment and plan: 55 y.o. male with  cirrhosis, well compensated  She has well compensated cirrhosis without signs of significant biochemical, synthetic dysfunction. His platelets are bit low in the 120s. He is going to get set of blood work today to restage his liver disease, also look for other potential causes which I think are unlikely. Fortunately his hepatitis C was eradicated successfully through infectious  disease. We discussed the nature of cirrhosis, natural history, risks. He understands that he I will be meeting least every once a year. He is due for hepatoma screening with ultrasound every 6 months, ultrasound next time will be September 2017. We discussed screening for varices at the same time as colon cancer screening and we will set up an EGD as well as colonoscopy. He understands he should continue to avoid alcohol, avoidance of his best as possible.   Owens Loffler, MD [facility] 10/14/2015, 10:51 AM  Cc: Thayer Headings, MD

## 2015-10-14 NOTE — Patient Instructions (Addendum)
You will get labs drawn today: total iron, ferritin, TIBC, ANA, AMA, alphafeto protein (AFP), anti smooth muscle antibody,  CBC, CMET, INR. You will be set up for an upper endoscopy to screen for varices You will be set up for a colonoscopy to screen for colon cancer. It is important that you have a relatively low salt diet.  High salt diet can cause fluid to accumulate in your legs, abdomen and even around your lungs. You should try to avoid NSAID type over the counter pain medicines as best as possible. Tylenol is safe to take for 'routine' aches and pains, but never take more than 1/2 the dose suggested on the package instructions (never more than 2 grams per day). Avoid alcohol. Please return to see Dr. Ardis Hughs in 10months [place] of liver in 3 months, screening for hepatoma.

## 2015-10-15 LAB — ANA: Anti Nuclear Antibody(ANA): NEGATIVE

## 2015-10-15 LAB — MITOCHONDRIAL ANTIBODIES: Mitochondrial M2 Ab, IgG: <=20 Units (ref <=20.0)

## 2015-10-15 LAB — AFP TUMOR MARKER: AFP-Tumor Marker: 7.6 ng/mL — ABNORMAL HIGH (ref <6.1)

## 2015-10-18 LAB — ANTI-SMOOTH MUSCLE ANTIBODY, IGG: Smooth Muscle Ab: 29 U — ABNORMAL HIGH (ref <20)

## 2015-10-31 ENCOUNTER — Encounter (HOSPITAL_COMMUNITY): Payer: Self-pay | Admitting: Emergency Medicine

## 2015-10-31 ENCOUNTER — Ambulatory Visit (HOSPITAL_COMMUNITY)
Admission: EM | Admit: 2015-10-31 | Discharge: 2015-10-31 | Disposition: A | Discharge status: Home / Self Care | Payer: Medicare Other | Attending: Family Medicine | Admitting: Family Medicine

## 2015-10-31 DIAGNOSIS — L0291 Cutaneous abscess, unspecified: Secondary | ICD-10-CM | POA: Diagnosis not present

## 2015-10-31 DIAGNOSIS — Z87891 Personal history of nicotine dependence: Secondary | ICD-10-CM | POA: Insufficient documentation

## 2015-10-31 DIAGNOSIS — F209 Schizophrenia, unspecified: Secondary | ICD-10-CM | POA: Diagnosis not present

## 2015-10-31 DIAGNOSIS — L0231 Cutaneous abscess of buttock: Secondary | ICD-10-CM | POA: Diagnosis not present

## 2015-10-31 DIAGNOSIS — I10 Essential (primary) hypertension: Secondary | ICD-10-CM | POA: Diagnosis not present

## 2015-10-31 DIAGNOSIS — Z79899 Other long term (current) drug therapy: Secondary | ICD-10-CM | POA: Insufficient documentation

## 2015-10-31 DIAGNOSIS — L03317 Cellulitis of buttock: Secondary | ICD-10-CM | POA: Diagnosis not present

## 2015-10-31 DIAGNOSIS — Z8572 Personal history of non-Hodgkin lymphomas: Secondary | ICD-10-CM | POA: Insufficient documentation

## 2015-10-31 DIAGNOSIS — L0390 Cellulitis, unspecified: Secondary | ICD-10-CM

## 2015-10-31 MED ORDER — IBUPROFEN 800 MG PO TABS
800.0000 mg | ORAL_TABLET | Freq: Once | ORAL | Status: AC
  Administered 2015-10-31: 800 mg via ORAL

## 2015-10-31 MED ORDER — IBUPROFEN 800 MG PO TABS
ORAL_TABLET | ORAL | Status: AC
Start: 2015-10-31 — End: 2015-10-31
  Filled 2015-10-31: qty 1

## 2015-10-31 MED ORDER — CLINDAMYCIN HCL 300 MG PO CAPS: 300.0000 mg | ORAL_CAPSULE | Freq: Three times a day (TID) | ORAL | Status: DC

## 2015-10-31 NOTE — ED Provider Notes (Addendum)
CSN: [id number]     Arrival date & time 10/31/15  1454 History   First MD Initiated Contact with Patient 10/31/15 1601     Chief Complaint  Patient presents with  . Abscess   (Consider location/radiation/quality/duration/timing/severity/associated sxs/prior Treatment) HPI  2-3 day history of left buttock pain and swelling. Multiple lesions noted this morning that started to drain. Epsom salts soaks without significant benefit. Has had these before. Typically require drainage. Denies any fevers, general malaise, chest pain, shortness of breath, other rash.  Past Medical History  Diagnosis Date  . Hepatitis C   . Hypertension   . Schizophrenia ([place])   . Hemorrhoids   . Rectal bleeding   . Non Hodgkin's lymphoma ([place])   . Cancer ([place])     lymphoma  . Pulmonary nodule 07/08/2015   History reviewed. No pertinent past surgical history. History reviewed. No pertinent family history. Social History  Substance Use Topics  . Smoking status: Former Smoker    Quit date: 04/26/1988  . Smokeless tobacco: Never Used  . Alcohol Use: No    Review of Systems Per HPI with all other pertinent systems negative.   Allergies  Review of patient's allergies indicates no known allergies.  Home Medications   Prior to Admission medications   Medication Sig Start Date End Date Taking? Authorizing Provider  gabapentin (NEURONTIN) 300 MG capsule Take 300 mg by mouth 3 (three) times daily.   Yes Historical Provider, MD  OLANZapine (ZYPREXA) 20 MG tablet Take 40 mg by mouth at bedtime.   Yes Historical Provider, MD  perphenazine (TRILAFON) 8 MG tablet Take 8 mg by mouth 2 (two) times daily.   Yes Historical Provider, MD  traZODone (DESYREL) 100 MG tablet Take 100 mg by mouth at bedtime.   Yes Historical Provider, MD  benztropine (COGENTIN) 1 MG tablet Take 1 mg by mouth daily.    Historical Provider, MD  clindamycin (CLEOCIN) 300 MG capsule Take 1 capsule (300 mg total) by mouth 3 (three) times daily.  10/31/15   Waldemar Dickens, MD  Na Sulfate-K Sulfate-Mg Sulf 17.5-3.13-1.6 GM/180ML SOLN Take 1 kit by mouth once. 10/14/15   Milus Banister, MD   Meds Ordered and Administered this Visit   Medications  ibuprofen (ADVIL,MOTRIN) tablet 800 mg (not administered)    BP 156/86 mmHg  Pulse 85  Temp(Src) 98.1 F (36.7 C) (Oral)  Resp 20  SpO2 99% No data found.   Physical Exam Physical Exam  Constitutional: oriented to person, place, and time. appears well-developed and well-nourished. No distress.  HENT:  Head: Normocephalic and atraumatic.  Eyes: EOMI. PERRL.  Neck: Normal range of motion.  Cardiovascular: RRR, no m/r/g, 2+ distal pulses,  Pulmonary/Chest: Effort normal and breath sounds normal. No respiratory distress.  Abdominal: Soft. Bowel sounds are normal. NonTTP, no distension.  Musculoskeletal: Normal range of motion. Non ttp, no effusion.  Neurological: alert and oriented to person, place, and time.  Skin: 8 x 7 cm area of induration and erythema. 3 medially located areas of fluctuance with the inferior to lesions connecting. Psychiatric: normal mood and affect. behavior is normal. Judgment and thought content normal.   ED Course  .Marland KitchenIncision and Drainage Date/Time: 10/31/2015 4:28 PM Performed by: Marily Memos, Alayiah Fontes J Authorized by: Marily Memos, Holden Draughon J Consent: Verbal consent obtained. Risks and benefits: risks, benefits and alternatives were discussed Consent given by: patient Patient identity confirmed: verbally with patient Type: abscess Location: L medial buttock. Anesthesia: local infiltration Local anesthetic: lidocaine 1% without epinephrine  Anesthetic total: 3 ml Patient sedated: no Scalpel size: 11 Incision type: single straight (x2) Complexity: simple Drainage: purulent Drainage amount: copious Packing material: none Patient tolerance: Patient tolerated the procedure well with no immediate complications   (including critical care time)  Labs Review Labs  Reviewed  AEROBIC CULTURE (SUPERFICIAL SPECIMEN)    Imaging Review No results found.   Visual Acuity Review  Right Eye Distance:   Left Eye Distance:   Bilateral Distance:    Right Eye Near:   Left Eye Near:    Bilateral Near:         MDM   1. Abscess and cellulitis    I&D as above 2. Start clindamycin. Is a started due to the rapid onset and progression of illness concerning for MRSA. Patient counseled on need for probiotic or yogurt use and signs of C. difficile. Culture sent.  800 Milligrams ibuprofen PO provided in clinic.  Elevation in BP likely from pain from abscess. No change in medial regimen at this time.    Waldemar Dickens, MD 10/31/15 1629  Waldemar Dickens, MD 10/31/15 [phone]

## 2015-10-31 NOTE — Discharge Instructions (Signed)
You developed multiple abscesses and cellulitis. The abscesses were drained in clinic. This will require antibiotics to fully cure.  Please eat yogurt daily while on the antibiotic Please use ibuprofen for additional pain relief

## 2015-10-31 NOTE — ED Notes (Signed)
C/o abscess on left buttocks onset x1 week associated w/pain, swelling  Denies fevers, chills, drainage Has been soaking in Epson Salt A&O x4... NAD

## 2015-10-31 NOTE — ED Notes (Signed)
Applied 4x4 gauze and secured w/hypofix

## 2015-11-04 ENCOUNTER — Telehealth (HOSPITAL_COMMUNITY): Payer: Self-pay | Admitting: Emergency Medicine

## 2015-11-04 DIAGNOSIS — F209 Schizophrenia, unspecified: Secondary | ICD-10-CM | POA: Diagnosis not present

## 2015-11-04 NOTE — Telephone Encounter (Signed)
Called pt and notified of recent lab results from visit 7/7 Pt ID'd properly... Reports feeling better and swelling is going down, pain has subsided and has not had fevers.  Adv pt if sx are not getting better to return  Pt verb understanding  Per Dr. Valere Dross,   Notes Recorded by Sherlene Shams, MD on 11/04/2015 at 12:24 PM rx for clindamycin given at [facility] visit 10/31/15. I&D performed at visit. Gram stain was polymicrobial; culture is positive for proteus. Clinical staff, please let patient know that we would consider changing abx (to cephalexin 500mg  bid x 10d #20 no refills) IF redness/swelling/drainage/pain at site is not improving, or for new fever >100.5. If redness/pain/swelling/drainage are improving, and no fever, would finish clindamycin rx given at [facility] visit.  Recheck as needed. LM

## 2015-11-06 LAB — AEROBIC CULTURE W GRAM STAIN (SUPERFICIAL SPECIMEN)

## 2015-11-07 ENCOUNTER — Telehealth (HOSPITAL_COMMUNITY): Payer: Self-pay | Admitting: *Deleted

## 2015-11-14 ENCOUNTER — Telehealth: Payer: Self-pay | Admitting: Internal Medicine

## 2015-11-14 NOTE — Telephone Encounter (Signed)
09/24/2014 Last visit

## 2015-11-18 NOTE — Telephone Encounter (Signed)
Needs PCP appt HTn F/U next 90 days

## 2015-11-18 NOTE — Telephone Encounter (Signed)
Waiting to hear from [facility] whether we are PCP or whether he transferred care.

## 2015-11-19 NOTE — Telephone Encounter (Signed)
Please schedule patient for HTN f/u within 90 days per Dr. Lynnae January. Thanks!

## 2015-11-25 NOTE — Telephone Encounter (Signed)
Appt was made for the patient with pcp on 01/02/16 @ 2:15 pm.  Appt letter was mailed 11/21/15 and I left message on voicemail this morning informing patient of upcoming appt.

## 2015-12-05 ENCOUNTER — Encounter: Payer: Self-pay | Admitting: Gastroenterology

## 2015-12-05 ENCOUNTER — Ambulatory Visit (AMBULATORY_SURGERY_CENTER): Payer: Medicare Other | Admitting: Gastroenterology

## 2015-12-05 VITALS — BP 148/86 | HR 66 | Temp 99.3°F | Resp 15 | Ht 65.5 in | Wt 221.0 lb

## 2015-12-05 DIAGNOSIS — Z1211 Encounter for screening for malignant neoplasm of colon: Secondary | ICD-10-CM

## 2015-12-05 DIAGNOSIS — I864 Gastric varices: Secondary | ICD-10-CM

## 2015-12-05 DIAGNOSIS — K739 Chronic hepatitis, unspecified: Secondary | ICD-10-CM | POA: Diagnosis not present

## 2015-12-05 DIAGNOSIS — K7460 Unspecified cirrhosis of liver: Secondary | ICD-10-CM | POA: Diagnosis not present

## 2015-12-05 DIAGNOSIS — E669 Obesity, unspecified: Secondary | ICD-10-CM | POA: Diagnosis not present

## 2015-12-05 DIAGNOSIS — Z13810 Encounter for screening for upper gastrointestinal disorder: Secondary | ICD-10-CM | POA: Diagnosis not present

## 2015-12-05 DIAGNOSIS — D124 Benign neoplasm of descending colon: Secondary | ICD-10-CM | POA: Diagnosis not present

## 2015-12-05 DIAGNOSIS — D123 Benign neoplasm of transverse colon: Secondary | ICD-10-CM | POA: Diagnosis not present

## 2015-12-05 DIAGNOSIS — I8500 Esophageal varices without bleeding: Secondary | ICD-10-CM | POA: Diagnosis not present

## 2015-12-05 DIAGNOSIS — I10 Essential (primary) hypertension: Secondary | ICD-10-CM | POA: Diagnosis not present

## 2015-12-05 MED ORDER — SODIUM CHLORIDE 0.9 % IV SOLN: 500.0000 mL | INTRAVENOUS | Status: DC

## 2015-12-05 NOTE — Progress Notes (Signed)
Called to room to assist during endoscopic procedure.  Patient ID and intended procedure confirmed with present staff. Received instructions for my participation in the procedure from the performing physician.  

## 2015-12-05 NOTE — Op Note (Signed)
[facility] Patient Name: [name] Procedure Date: 12/05/2015 2:56 PM MRN: [id number] Endoscopist: Milus Banister , MD Age: 55 Referring MD:  Date of Birth: May 02, 1960 Gender: Male Account #: [id number] Procedure:                Colonoscopy Indications:              Screening for colorectal malignant neoplasm Medicines:                Monitored Anesthesia Care Procedure:                Pre-Anesthesia Assessment:                           - Prior to the procedure, a History and Physical                            was performed, and patient medications and                            allergies were reviewed. The patient's tolerance of                            previous anesthesia was also reviewed. The risks                            and benefits of the procedure and the sedation                            options and risks were discussed with the patient.                            All questions were answered, and informed consent                            was obtained. Prior Anticoagulants: The patient has                            taken no previous anticoagulant or antiplatelet                            agents. ASA Grade Assessment: III - A patient with                            severe systemic disease. After reviewing the risks                            and benefits, the patient was deemed in                            satisfactory condition to undergo the procedure.                           After obtaining informed consent, the colonoscope  was passed under direct vision. Throughout the                            procedure, the patient's blood pressure, pulse, and                            oxygen saturations were monitored continuously. The                            Model CF-HQ190L [id number]) scope was introduced                            through the anus and advanced to the the cecum,                            identified by  appendiceal orifice and ileocecal                            valve. The colonoscopy was performed without                            difficulty. The patient tolerated the procedure                            well. The quality of the bowel preparation was                            good. The ileocecal valve, appendiceal orifice, and                            rectum were photographed. Scope In: 2:59:30 PM Scope Out: 3:12:56 PM Scope Withdrawal Time: 0 hours 11 minutes 36 seconds  Total Procedure Duration: 0 hours 13 minutes 26 seconds  Findings:                 A 2 mm polyp was found in the transverse colon. The                            polyp was sessile. The polyp was removed with a                            cold biopsy forceps. Resection and retrieval were                            complete.                           A 20 mm polyp was found in the descending colon.                            The polyp was semi-pedunculated. The polyp was                            removed with a hot snare. Resection and  retrieval                            were complete. Area was tattooed with an injection                            of [place] ink.                           The exam was otherwise without abnormality on                            direct and retroflexion views. Complications:            No immediate complications. Estimated blood loss:                            None. Estimated Blood Loss:     Estimated blood loss: none. Impression:               - One 2 mm polyp in the transverse colon, removed                            with a cold biopsy forceps. Resected and retrieved.                           - One 20 mm polyp in the descending colon, removed                            with a hot snare. Resected and retrieved. The site                            was labeled with submucosal injection of [place] Ink                            following resection.                           - The  examination was otherwise normal on direct                            and retroflexion views. Recommendation:           - Patient has a contact number available for                            emergencies. The signs and symptoms of potential                            delayed complications were discussed with the                            patient. Return to normal activities tomorrow.                            Written discharge instructions were provided to  the                            patient.                           - Resume previous diet.                           - Continue present medications.                           You will receive a letter within 2-3 weeks with the                            pathology results and my final recommendations.                           If the polyp(s) is proven to be 'pre-cancerous' on                            pathology, you will need repeat colonoscopy in 3                            years. If the polyp(s) is NOT 'precancerous' on                            pathology then you should repeat colon cancer                            screening in 10 years with colonoscopy without need                            for colon cancer screening by any method prior to                            then (including stool testing). Milus Banister, MD 12/05/2015 3:21:37 PM This report has been signed electronically.

## 2015-12-05 NOTE — Op Note (Signed)
[facility] Patient Name: [name] Procedure Date: 12/05/2015 2:56 PM MRN: [id number] Endoscopist: Milus Banister , MD Age: 55 Referring MD:  Date of Birth: 08-15-1960 Gender: Male Account #: [id number] Procedure:                Upper GI endoscopy Indications:              Screening procedure Medicines:                Monitored Anesthesia Care Procedure:                Pre-Anesthesia Assessment:                           - Prior to the procedure, a History and Physical                            was performed, and patient medications and                            allergies were reviewed. The patient's tolerance of                            previous anesthesia was also reviewed. The risks                            and benefits of the procedure and the sedation                            options and risks were discussed with the patient.                            All questions were answered, and informed consent                            was obtained. Prior Anticoagulants: The patient has                            taken no previous anticoagulant or antiplatelet                            agents. ASA Grade Assessment: III - A patient with                            severe systemic disease. After reviewing the risks                            and benefits, the patient was deemed in                            satisfactory condition to undergo the procedure.                           After obtaining informed consent, the endoscope was  passed under direct vision. Throughout the                            procedure, the patient's blood pressure, pulse, and                            oxygen saturations were monitored continuously. The                            Model GIF-[id number]) scope was introduced                            through the mouth, and advanced to the second part                            of duodenum. The upper GI  endoscopy was                            accomplished without difficulty. The patient                            tolerated the procedure well. Scope In: Scope Out: Findings:                 The esophagus was normal. No varices.                           The stomach was normal. No varices or portal                            gastropathy.                           The examined duodenum was normal. Complications:            No immediate complications. Estimated Blood Loss:     Estimated blood loss: none. Impression:               - Normal esophagus.                           - Normal stomach.                           - Normal examined duodenum.                           - No specimens collected. Recommendation:           - Patient has a contact number available for                            emergencies. The signs and symptoms of potential                            delayed complications were discussed with the  patient. Return to normal activities tomorrow.                            Written discharge instructions were provided to the                            patient.                           - Resume previous diet.                           - Continue present medications.                           - No repeat upper endoscopy. Milus Banister, MD 12/05/2015 3:24:39 PM This report has been signed electronically.

## 2015-12-05 NOTE — Patient Instructions (Signed)

## 2015-12-05 NOTE — Progress Notes (Signed)
To recovery, report to [place], RN, VSS 

## 2015-12-08 ENCOUNTER — Telehealth: Payer: Self-pay

## 2015-12-08 NOTE — Telephone Encounter (Signed)
  Follow up Call-  Call back number 12/05/2015  Post procedure Call Back phone  # [phone]  Permission to leave phone message Yes  Some recent data might be hidden    Patient was called for follow up after his procedure on 12/05/2015. No answer at the number given for follow up phone call. A message was left on the answering machine.

## 2015-12-12 ENCOUNTER — Encounter: Payer: Self-pay | Admitting: Gastroenterology

## 2015-12-25 ENCOUNTER — Encounter: Payer: Self-pay | Admitting: *Deleted

## 2016-01-02 ENCOUNTER — Encounter: Payer: Medicare Other | Admitting: Internal Medicine

## 2016-01-05 ENCOUNTER — Telehealth: Payer: Self-pay | Admitting: Internal Medicine

## 2016-01-05 ENCOUNTER — Other Ambulatory Visit (HOSPITAL_BASED_OUTPATIENT_CLINIC_OR_DEPARTMENT_OTHER): Payer: Medicare Other

## 2016-01-05 DIAGNOSIS — R911 Solitary pulmonary nodule: Secondary | ICD-10-CM

## 2016-01-05 DIAGNOSIS — C8338 Diffuse large B-cell lymphoma, lymph nodes of multiple sites: Secondary | ICD-10-CM

## 2016-01-05 DIAGNOSIS — Z8572 Personal history of non-Hodgkin lymphomas: Secondary | ICD-10-CM | POA: Diagnosis present

## 2016-01-05 LAB — COMPREHENSIVE METABOLIC PANEL
ALT: 16 U/L (ref 0–55)
AST: 18 U/L (ref 5–34)
Albumin: 3.8 g/dL (ref 3.5–5.0)
Alkaline Phosphatase: 74 U/L (ref 40–150)
Anion Gap: 11 mEq/L (ref 3–11)
BUN: 9.5 mg/dL (ref 7.0–26.0)
CO2: 25 mEq/L (ref 22–29)
Calcium: 9.8 mg/dL (ref 8.4–10.4)
Chloride: 106 mEq/L (ref 98–109)
Creatinine: 1 mg/dL (ref 0.7–1.3)
EGFR: >90 mL/min/{1.73_m2} (ref >90)
Glucose: 90 mg/dl (ref 70–140)
Potassium: 3.9 mEq/L (ref 3.5–5.1)
Sodium: 142 mEq/L (ref 136–145)
Total Bilirubin: 1.09 mg/dL (ref 0.20–1.20)
Total Protein: 8.4 g/dL — ABNORMAL HIGH (ref 6.4–8.3)

## 2016-01-05 LAB — CBC WITH DIFFERENTIAL/PLATELET
BASO%: 0.2 % (ref 0.0–2.0)
Basophils Absolute: 0 10*3/uL (ref 0.0–0.1)
EOS%: 0.9 % (ref 0.0–7.0)
Eosinophils Absolute: 0.1 10*3/uL (ref 0.0–0.5)
HCT: 41 % (ref 38.4–49.9)
HGB: 13.5 g/dL (ref 13.0–17.1)
LYMPH%: 23.6 % (ref 14.0–49.0)
MCH: 26 pg — ABNORMAL LOW (ref 27.2–33.4)
MCHC: 32.9 g/dL (ref 32.0–36.0)
MCV: 78.8 fL — ABNORMAL LOW (ref 79.3–98.0)
MONO#: 0.6 10*3/uL (ref 0.1–0.9)
MONO%: 10.9 % (ref 0.0–14.0)
NEUT#: 3.5 10*3/uL (ref 1.5–6.5)
NEUT%: 64.4 % (ref 39.0–75.0)
Platelets: 122 10*3/uL — ABNORMAL LOW (ref 140–400)
RBC: 5.2 10*6/uL (ref 4.20–5.82)
RDW: 13.4 % (ref 11.0–14.6)
WBC: 5.4 10*3/uL (ref 4.0–10.3)
lymph#: 1.3 10*3/uL (ref 0.9–3.3)

## 2016-01-05 LAB — LACTATE DEHYDROGENASE: LDH: 163 U/L (ref 125–245)

## 2016-01-05 NOTE — Telephone Encounter (Signed)
Patient stopped by after lab today re ct not being scheduled. Checked with managed care and Josem Kaufmann has been completed. Called central radiology and spoke with Elmo Putt for scan appointment 01/08/16 @ 2 pm at [facility]. Patient has ct and f/u date/time. Prep and instructions.

## 2016-01-08 ENCOUNTER — Encounter: Payer: Self-pay | Admitting: Gastroenterology

## 2016-01-08 ENCOUNTER — Ambulatory Visit (HOSPITAL_COMMUNITY)
Admission: RE | Admit: 2016-01-08 | Discharge: 2016-01-08 | Disposition: A | Discharge status: Home / Self Care | Payer: Medicare Other | Source: Ambulatory Visit | Attending: Internal Medicine | Admitting: Internal Medicine

## 2016-01-08 DIAGNOSIS — C8590 Non-Hodgkin lymphoma, unspecified, unspecified site: Secondary | ICD-10-CM | POA: Diagnosis not present

## 2016-01-08 DIAGNOSIS — R911 Solitary pulmonary nodule: Secondary | ICD-10-CM | POA: Insufficient documentation

## 2016-01-08 DIAGNOSIS — R161 Splenomegaly, not elsewhere classified: Secondary | ICD-10-CM | POA: Diagnosis not present

## 2016-01-08 DIAGNOSIS — K766 Portal hypertension: Secondary | ICD-10-CM | POA: Insufficient documentation

## 2016-01-08 DIAGNOSIS — K7460 Unspecified cirrhosis of liver: Secondary | ICD-10-CM | POA: Diagnosis not present

## 2016-01-08 DIAGNOSIS — C8338 Diffuse large B-cell lymphoma, lymph nodes of multiple sites: Secondary | ICD-10-CM | POA: Diagnosis not present

## 2016-01-08 MED ORDER — IOPAMIDOL (ISOVUE-300) INJECTION 61%
100.0000 mL | Freq: Once | INTRAVENOUS | Status: AC | PRN
  Administered 2016-01-08: 100 mL via INTRAVENOUS

## 2016-01-12 ENCOUNTER — Ambulatory Visit (HOSPITAL_BASED_OUTPATIENT_CLINIC_OR_DEPARTMENT_OTHER): Payer: Medicare Other | Admitting: Internal Medicine

## 2016-01-12 ENCOUNTER — Telehealth: Payer: Self-pay | Admitting: Internal Medicine

## 2016-01-12 ENCOUNTER — Encounter: Payer: Self-pay | Admitting: Internal Medicine

## 2016-01-12 VITALS — BP 152/75 | HR 68 | Temp 98.7°F | Resp 18 | Ht 65.5 in | Wt 220.2 lb

## 2016-01-12 DIAGNOSIS — B1920 Unspecified viral hepatitis C without hepatic coma: Secondary | ICD-10-CM

## 2016-01-12 DIAGNOSIS — C8332 Diffuse large B-cell lymphoma, intrathoracic lymph nodes: Secondary | ICD-10-CM | POA: Insufficient documentation

## 2016-01-12 DIAGNOSIS — C8590 Non-Hodgkin lymphoma, unspecified, unspecified site: Secondary | ICD-10-CM

## 2016-01-12 DIAGNOSIS — K7030 Alcoholic cirrhosis of liver without ascites: Secondary | ICD-10-CM

## 2016-01-12 NOTE — Telephone Encounter (Signed)
Avs report and appointment schedule given to patient, per 01/12/16 los. °

## 2016-01-12 NOTE — Progress Notes (Signed)
[facility] [phone]   [phone]  OFFICE PROGRESS NOTE  PRINCIPAL DIAGNOSIS: Stage IV non-Hodgkin lymphoma diagnosed in March 2009.   PRIOR THERAPY:  1. Status post 4 weekly doses of Rituxan during the patient's hospitalization at the intensive care unit at [facility] with respiratory failure. 2. Status post 7 cycles of systemic chemotherapy with CHOP/Rituxan. Last dose was given January 15, 2008.  CURRENT THERAPY: Observation.  INTERVAL HISTORY: [name] 55 y.o. male returns to the clinic today for annual followup visit accompanied by his sister. The patient is feeling fine today with no specific complaints. He denied having any significant weight loss or night sweats. He continues to gain weight and feeling well. He has no palpable lymphadenopathy. He denied having any significant chest pain, shortness of breath, cough or hemoptysis. He had repeat CT scan of the chest, abdomen and pelvis performed recently and he is here for evaluation and discussion of his scan results.  MEDICAL HISTORY: Past Medical History:  Diagnosis Date  . Cancer ([place])    lymphoma  . Hemorrhoids   . Hepatitis C   . Hypertension   . Non Hodgkin's lymphoma ([place])   . Pulmonary nodule 07/08/2015  . Rectal bleeding   . Schizophrenia ([place])     ALLERGIES:  has No Known Allergies.  MEDICATIONS:  Current Outpatient Prescriptions  Medication Sig Dispense Refill  . benztropine (COGENTIN) 1 MG tablet Take 1 mg by mouth daily.    Marland Kitchen gabapentin (NEURONTIN) 300 MG capsule Take 300 mg by mouth 3 (three) times daily.    . hydrochlorothiazide (MICROZIDE) 12.5 MG capsule TAKE 1 CAPSULE DAILY. 90 capsule 0  . OLANZapine (ZYPREXA) 20 MG tablet Take 40 mg by mouth at bedtime.    Marland Kitchen perphenazine (TRILAFON) 8 MG tablet Take 8 mg by mouth 2 (two) times daily.    . traZODone (DESYREL) 100 MG tablet Take 100 mg by mouth at bedtime.     Current Facility-Administered  Medications  Medication Dose Route Frequency Provider Last Rate Last Dose  . 0.9 %  sodium chloride infusion  500 mL Intravenous Continuous Milus Banister, MD        REVIEW OF SYSTEMS:  A comprehensive review of systems was negative.   PHYSICAL EXAMINATION: General appearance: alert, cooperative and no distress Head: Normocephalic, without obvious abnormality, atraumatic Neck: no adenopathy Lymph nodes: Cervical, supraclavicular, and axillary nodes normal. Resp: clear to auscultation bilaterally Cardio: regular rate and rhythm, S1, S2 normal, no murmur, click, rub or gallop GI: soft, non-tender; bowel sounds normal; no masses,  no organomegaly Extremities: extremities normal, atraumatic, no cyanosis or edema  ECOG PERFORMANCE STATUS: 0 - Asymptomatic  Blood pressure (!) 152/75, pulse 68, temperature 98.7 F (37.1 C), temperature source Oral, resp. rate 18, height 5' 5.5" (1.664 m), weight 220 lb 3.2 oz (99.9 kg), SpO2 100 %.  LABORATORY DATA: Lab Results  Component Value Date   WBC 5.4 01/05/2016   HGB 13.5 01/05/2016   HCT 41.0 01/05/2016   MCV 78.8 (L) 01/05/2016   PLT 122 (L) 01/05/2016      Chemistry      Component Value Date/Time   NA 142 01/05/2016 1351   K 3.9 01/05/2016 1351   CL 104 10/14/2015 1107   CL 107 06/20/2012 1032   CO2 25 01/05/2016 1351   BUN 9.5 01/05/2016 1351   CREATININE 1.0 01/05/2016 1351      Component Value Date/Time   CALCIUM  9.8 01/05/2016 1351   ALKPHOS 74 01/05/2016 1351   AST 18 01/05/2016 1351   ALT 16 01/05/2016 1351   BILITOT 1.09 01/05/2016 1351       RADIOGRAPHIC STUDIES: Ct Chest W Contrast  Result Date: 01/08/2016 CLINICAL DATA:  Non-Hodgkin's lymphoma. Subsequent treatment evaluation. Chemotherapy complete. EXAM: CT CHEST, ABDOMEN, AND PELVIS WITH CONTRAST TECHNIQUE: Multidetector CT imaging of the chest, abdomen and pelvis was performed following the standard protocol during bolus administration of intravenous contrast.  CONTRAST:  182mL ISOVUE-300 IOPAMIDOL (ISOVUE-300) INJECTION 61% COMPARISON:  CT 07/01/2015 FINDINGS: CT CHEST FINDINGS Cardiovascular: Unremarkable Mediastinum/Nodes: No axillary supraclavicular lymphadenopathy. No pericardial fluid. Esophagus normal Lungs/Pleura: Resolution of the RIGHT middle lobe nodule. No new pulmonary nodules. Airways normal. Musculoskeletal: No aggressive osseous lesion. CT ABDOMEN AND PELVIS FINDINGS Hepatobiliary: Liver has a lobular contour. Caudate lobe enlarged. No enhancing hepatic lesion. Pancreas: Pancreas is normal. No ductal dilatation. No pancreatic inflammation. Spleen: Spleen is enlarged. Portal vein patent. Splenic vein patent. No Ascites. Adrenals/urinary tract: Adrenal glands and kidneys are normal. The ureters and bladder normal. Stomach/Bowel: Stomach, small bowel, appendix, and cecum are normal. The colon and rectosigmoid colon are normal. Vascular/Lymphatic: Abdominal aorta is normal caliber. There is no retroperitoneal or periportal lymphadenopathy. No pelvic lymphadenopathy. LEFT inguinal lymph node measuring 19 mm short axis is unchanged (image 122, series 2 Reproductive: Prostate normal Other: No free fluid. Musculoskeletal: No aggressive osseous lesion. IMPRESSION: Chest Impression: 1. No lymphadenopathy in the thorax. 2. Resolution of RIGHT lung pulmonary nodule. Abdomen / Pelvis Impression: 1. No adenopathy the abdomen or pelvis. 2. Cirrhosis and portal hypertension with splenomegaly. 3. No change in prominent LEFT inguinal lymph node. Electronically Signed   By: Suzy Bouchard M.D.   On: 01/08/2016 17:34   Ct Abdomen Pelvis W Contrast  Result Date: 01/08/2016 CLINICAL DATA:  Non-Hodgkin's lymphoma. Subsequent treatment evaluation. Chemotherapy complete. EXAM: CT CHEST, ABDOMEN, AND PELVIS WITH CONTRAST TECHNIQUE: Multidetector CT imaging of the chest, abdomen and pelvis was performed following the standard protocol during bolus administration of intravenous  contrast. CONTRAST:  159mL ISOVUE-300 IOPAMIDOL (ISOVUE-300) INJECTION 61% COMPARISON:  CT 07/01/2015 FINDINGS: CT CHEST FINDINGS Cardiovascular: Unremarkable Mediastinum/Nodes: No axillary supraclavicular lymphadenopathy. No pericardial fluid. Esophagus normal Lungs/Pleura: Resolution of the RIGHT middle lobe nodule. No new pulmonary nodules. Airways normal. Musculoskeletal: No aggressive osseous lesion. CT ABDOMEN AND PELVIS FINDINGS Hepatobiliary: Liver has a lobular contour. Caudate lobe enlarged. No enhancing hepatic lesion. Pancreas: Pancreas is normal. No ductal dilatation. No pancreatic inflammation. Spleen: Spleen is enlarged. Portal vein patent. Splenic vein patent. No Ascites. Adrenals/urinary tract: Adrenal glands and kidneys are normal. The ureters and bladder normal. Stomach/Bowel: Stomach, small bowel, appendix, and cecum are normal. The colon and rectosigmoid colon are normal. Vascular/Lymphatic: Abdominal aorta is normal caliber. There is no retroperitoneal or periportal lymphadenopathy. No pelvic lymphadenopathy. LEFT inguinal lymph node measuring 19 mm short axis is unchanged (image 122, series 2 Reproductive: Prostate normal Other: No free fluid. Musculoskeletal: No aggressive osseous lesion. IMPRESSION: Chest Impression: 1. No lymphadenopathy in the thorax. 2. Resolution of RIGHT lung pulmonary nodule. Abdomen / Pelvis Impression: 1. No adenopathy the abdomen or pelvis. 2. Cirrhosis and portal hypertension with splenomegaly. 3. No change in prominent LEFT inguinal lymph node. Electronically Signed   By: Suzy Bouchard M.D.   On: 01/08/2016 17:34    ASSESSMENT AND PLAN: This is a very pleasant 55 years old [place] American male with history of stage IV non-Hodgkin lymphoma diagnosed in March of 2009 status post systemic  chemotherapy and has been observation since September of 2009 with no evidence for disease recurrence.  The recent CT scan of the chest, abdomen and pelvis showed no  evidence for disease recurrence and there was resolution of the right lung pulmonary nodule. I discussed the scan results with the patient and his sister. I recommended for him to continue on observation with repeat CT scan of the chest, abdomen and pelvis in one year. The patient was advised to call me immediately if he has any concerning symptoms in the interval.  All questions were answered. The patient knows to call the clinic with any problems, questions or concerns. We can certainly see the patient much sooner if necessary.  Disclaimer: This note was dictated with voice recognition software. Similar sounding words can inadvertently be transcribed and may not be corrected upon review.

## 2016-01-29 DIAGNOSIS — F209 Schizophrenia, unspecified: Secondary | ICD-10-CM | POA: Diagnosis not present

## 2016-01-30 DIAGNOSIS — Z23 Encounter for immunization: Secondary | ICD-10-CM | POA: Diagnosis not present

## 2016-03-01 ENCOUNTER — Other Ambulatory Visit: Payer: Self-pay | Admitting: Internal Medicine

## 2016-03-01 NOTE — Telephone Encounter (Signed)
Last appt 09/24/2014 Next appt 11/17/2017w/pcp

## 2016-03-02 ENCOUNTER — Ambulatory Visit (INDEPENDENT_AMBULATORY_CARE_PROVIDER_SITE_OTHER): Payer: Medicare Other

## 2016-03-02 ENCOUNTER — Ambulatory Visit (HOSPITAL_COMMUNITY)
Admission: EM | Admit: 2016-03-02 | Discharge: 2016-03-02 | Disposition: A | Discharge status: Home / Self Care | Payer: Medicare Other | Attending: Family Medicine | Admitting: Family Medicine

## 2016-03-02 ENCOUNTER — Encounter (HOSPITAL_COMMUNITY): Payer: Self-pay | Admitting: Emergency Medicine

## 2016-03-02 DIAGNOSIS — J069 Acute upper respiratory infection, unspecified: Secondary | ICD-10-CM

## 2016-03-02 DIAGNOSIS — R05 Cough: Secondary | ICD-10-CM | POA: Diagnosis not present

## 2016-03-02 MED ORDER — DOXYCYCLINE HYCLATE 100 MG PO CAPS: 100.0000 mg | ORAL_CAPSULE | Freq: Two times a day (BID) | ORAL | 0 refills | Status: DC

## 2016-03-02 MED ORDER — IBUPROFEN 800 MG PO TABS
ORAL_TABLET | ORAL | Status: AC
  Filled 2016-03-02: qty 1

## 2016-03-02 MED ORDER — BENZONATATE 100 MG PO CAPS: 100.0000 mg | ORAL_CAPSULE | Freq: Three times a day (TID) | ORAL | 0 refills | Status: DC

## 2016-03-02 MED ORDER — IBUPROFEN 800 MG PO TABS
800.0000 mg | ORAL_TABLET | Freq: Once | ORAL | Status: AC
  Administered 2016-03-02: 800 mg via ORAL

## 2016-03-02 NOTE — ED Provider Notes (Signed)
CSN: [id number]     Arrival date & time 03/02/16  1144 History   First MD Initiated Contact with Patient 03/02/16 1231     Chief Complaint  Patient presents with  . Influenza   (Consider location/radiation/quality/duration/timing/severity/associated sxs/prior Treatment) 55 yr old AA male presents to UC with cc of cough, chest congestion x several days. Pt has been treating s/s with OTC tylenol cold.    The history is provided by the patient and a relative. No language interpreter was used.  URI  Presenting symptoms: congestion, cough and fever   Severity:  Moderate Onset quality:  Sudden Timing:  Constant Progression:  Unchanged Chronicity:  Recurrent Relieved by:  Nothing Worsened by:  Nothing Ineffective treatments:  OTC medications Associated symptoms: sinus pain and sneezing   Risk factors: sick contacts   Risk factors comment:  Chronic medical issues(HCC, Hep C, pneumonia)   Past Medical History:  Diagnosis Date  . Cancer ([place])    lymphoma  . Hemorrhoids   . Hepatitis C   . Hypertension   . Non Hodgkin's lymphoma ([place])   . Pulmonary nodule 07/08/2015  . Rectal bleeding   . Schizophrenia ([place])    History reviewed. No pertinent surgical history. History reviewed. No pertinent family history. Social History  Substance Use Topics  . Smoking status: Former Smoker    Quit date: 04/26/1988  . Smokeless tobacco: Never Used  . Alcohol use No    Review of Systems  Constitutional: Positive for fever.  HENT: Positive for congestion, sinus pain and sneezing.   Respiratory: Positive for cough.   Cardiovascular: Negative.   Gastrointestinal: Negative.   Endocrine: Negative.   Genitourinary: Negative.   Musculoskeletal: Negative.   Skin: Negative for rash.  Allergic/Immunologic: Negative.   Neurological: Negative.   Hematological: Negative.   Psychiatric/Behavioral: Negative.   All other systems reviewed and are negative.   Allergies  Patient has no known  allergies.  Home Medications   Prior to Admission medications   Medication Sig Start Date End Date Taking? Authorizing Provider  gabapentin (NEURONTIN) 300 MG capsule Take 300 mg by mouth 3 (three) times daily.   Yes Historical Provider, MD  hydrochlorothiazide (MICROZIDE) 12.5 MG capsule TAKE 1 CAPSULE DAILY. 03/01/16  Yes Collier Salina, MD  OLANZapine (ZYPREXA) 20 MG tablet Take 40 mg by mouth at bedtime.   Yes Historical Provider, MD  perphenazine (TRILAFON) 8 MG tablet Take 8 mg by mouth 2 (two) times daily.   Yes Historical Provider, MD  Phenylephrine-DM-GG-APAP (TYLENOL COLD/FLU SEVERE PO) Take by mouth.   Yes Historical Provider, MD  traZODone (DESYREL) 100 MG tablet Take 100 mg by mouth at bedtime.   Yes Historical Provider, MD  benzonatate (TESSALON) 100 MG capsule Take 1 capsule (100 mg total) by mouth every 8 (eight) hours. [id number]   Tori Milks, NP  benztropine (COGENTIN) 1 MG tablet Take 1 mg by mouth daily.    Historical Provider, MD  doxycycline (VIBRAMYCIN) 100 MG capsule Take 1 capsule (100 mg total) by mouth 2 (two) times daily. [id number]   Tori Milks, NP   Meds Ordered and Administered this Visit   Medications  ibuprofen (ADVIL,MOTRIN) tablet 800 mg (800 mg Oral Given 03/02/16 1338)    BP 141/70 (BP Location: Left Arm)   Temp 101.6 F (38.7 C) (Oral)   SpO2 99%  No data found.   Physical Exam  Constitutional: He is oriented to person, place, and time. He appears well-developed and well-nourished. He is active and  cooperative.  HENT:  Head: Normocephalic.  Right Ear: Tympanic membrane normal.  Left Ear: Tympanic membrane normal.  Nose: Mucosal edema present.  Mouth/Throat: Uvula is midline, oropharynx is clear and moist and mucous membranes are normal.  Eyes: EOM are normal.  Neck: Trachea normal.  Cardiovascular: Normal rate, regular rhythm, normal heart sounds and normal pulses.   Pulmonary/Chest: Effort normal and breath sounds normal.   Musculoskeletal: Normal range of motion.  Neurological: He is alert and oriented to person, place, and time. GCS eye subscore is 4. GCS verbal subscore is 5. GCS motor subscore is 6.  Skin: Skin is warm and dry. Capillary refill takes 2 to 3 seconds.  Psychiatric: He has a normal mood and affect. His behavior is normal. Judgment and thought content normal.  Nursing note and vitals reviewed.   Urgent Care Course   Clinical Course     Procedures (including critical care time)  Labs Review Labs Reviewed - No data to display  Imaging Review Dg Chest 2 View  Result Date: 03/02/2016 CLINICAL DATA:  Cough for 3 days with fever fatigue. EXAM: CHEST  2 VIEW COMPARISON:  01/08/2016, 01/24/2013 FINDINGS: Borderline cardiac enlargement but normal vascularity. Chronic left basilar scarring versus atelectasis laterally at the costophrenic angle. Slight elevation of the left hemidiaphragm. No focal pneumonia, collapse or consolidation. Negative for edema or pneumothorax. Trachea is midline. IMPRESSION: Stable chest exam.  No active disease. Electronically Signed   By: Jerilynn Mages.  Shick M.D.   On: 03/02/2016 13:00        Ibuprofen 800 mg po given in UC for temp.   MDM   1. Acute upper respiratory infection    Discussed xray results and plan of care with pt. Will treat with Doxycycline and Tessalon for URI/cough. Advised pt if developed new or worsening s/s to go to ER for further evaluation. Pt verbalized understanding to this provider.     Tori Milks, NP [phone]

## 2016-03-02 NOTE — ED Triage Notes (Signed)
Pt has been suffering from a deep cough for about 2-3 days.  Last night he started feeling achy and dizzy.  Pt denies any fever.  He has been taking Tylenol Cold & Flu with no relief.

## 2016-03-02 NOTE — Discharge Instructions (Signed)
Rest,push fluids, take meds as directed (doxycycline, tessalon). Follow up with PCP for recheck in 2 days , go to Er for new or worsening issues.

## 2016-03-03 ENCOUNTER — Telehealth: Payer: Self-pay | Admitting: Internal Medicine

## 2016-03-03 NOTE — Telephone Encounter (Signed)
APT. REMINDER CALL, LMTCB °

## 2016-03-04 ENCOUNTER — Encounter: Payer: Self-pay | Admitting: Internal Medicine

## 2016-03-04 ENCOUNTER — Ambulatory Visit (INDEPENDENT_AMBULATORY_CARE_PROVIDER_SITE_OTHER): Payer: Medicare Other | Admitting: Internal Medicine

## 2016-03-04 DIAGNOSIS — R0989 Other specified symptoms and signs involving the circulatory and respiratory systems: Secondary | ICD-10-CM

## 2016-03-04 DIAGNOSIS — K7460 Unspecified cirrhosis of liver: Secondary | ICD-10-CM | POA: Diagnosis not present

## 2016-03-04 DIAGNOSIS — Z9221 Personal history of antineoplastic chemotherapy: Secondary | ICD-10-CM | POA: Diagnosis not present

## 2016-03-04 DIAGNOSIS — Z8571 Personal history of Hodgkin lymphoma: Secondary | ICD-10-CM | POA: Diagnosis not present

## 2016-03-04 DIAGNOSIS — Z87891 Personal history of nicotine dependence: Secondary | ICD-10-CM

## 2016-03-04 DIAGNOSIS — J029 Acute pharyngitis, unspecified: Secondary | ICD-10-CM | POA: Insufficient documentation

## 2016-03-04 DIAGNOSIS — B182 Chronic viral hepatitis C: Secondary | ICD-10-CM

## 2016-03-04 NOTE — Patient Instructions (Signed)
You are okay to stop your doxycycline today.  For decongestants, try medications with phenylephrine. Tylenol Cold & Sinus should have this part.

## 2016-03-04 NOTE — Assessment & Plan Note (Addendum)
Assessment Three days ago, he noted the acute onset of cough. He also complains of achyness all over, headache, dizziness, decreased energy. His appetite has remained well and he has been tolerating oral intake. He denies any nausea, vomiting, diarrhea. He was in the emergency department 2 days ago and was prescribed doxycycline 100 mg twice daily and benzonatate 100 mg to be taken every 8 hours as needed for cough. Chest x-ray was without an acute infiltrate though did show interstitial changes suggestive of a viral illness.  Today, he reports he is feeling better with resolution of his cough. Congestion is still an issue and is wondering if we have something for him though we are without any samples. His sister is wondering if the benzonatate cross-reacts with the over-the-counter Tylenol Cold and Sinus syrup which he has been using for symptomatic relief.  His physical exam and presentation are most consistent with an acute viral illness which appears to be resolving now. Influenza would have been a thought though he did not have any GI symptoms which I would expect. It is not clear why he was discharged with antibiotic if she was diagnosed with a viral infection in the emergency department, so I will recommend discontinuing antibiotic therapy.   Given cirrhosis, he is at risk for being immunocompromised. He has a history of stage IV non-Hodgkin lymphoma in March 2009 and has been in observation since September following chemotherapy without evidence of disease recurrence, most recently assessed at his oncologist office visit on 01/12/16 per review of the chart. He also has chronic HCV infection for which he has completed treatment. He also has a history of cirrhosis.  Plan -Recommended discontinuing doxycycline given the risks of antibiotic resistance and the lack of indications for antibiotic therapy to treat a viral infection. They were both agreeable to this idea. -Recommended discontinuing  benzonatate. After I provided the patient with an AVS, his sister wondered if there were any drug interactions with this medication. Per Up-To-Date, benzonatate has been known to cause psychiatric effects, like disorientation and odd behavior, and he does have a history of mental illness. I crossed off the medication on their AVS and discontinued it from the EMR thereafter. -Recommended continuing Tylenol Cold & Sinus. -Continue HCTZ as he is tolerating oral intake and without findings of dehydration on exam -Offered Netty Pot though patient and his sister declined out of interest of cost -Encouraged him to follow-up with his PCP next week

## 2016-03-04 NOTE — Progress Notes (Signed)
   CC: cough  HPI:  [name] is a 56 y.o. male who presents today with his sister for cough. Please see assessment & plan for status of chronic medical problems.   Past Medical History:  Diagnosis Date  . Cancer ([place])    lymphoma  . Hemorrhoids   . Hepatitis C   . Hypertension   . Non Hodgkin's lymphoma ([place])   . Pulmonary nodule 07/08/2015  . Rectal bleeding   . Schizophrenia ([place])     Review of Systems:  Please see each problem below for a pertinent review of systems.   Physical Exam:  Vitals:   03/04/16 1333  BP: (!) 157/69  Pulse: 64  Temp: 99.2 F (37.3 C)  SpO2: 98%  Weight: 224 lb (101.6 kg)   Physical Exam  Constitutional: He is oriented to person, place, and time. No distress.  HENT:  Head: Normocephalic and atraumatic.  Mouth/Throat: Oropharynx is clear and moist. No oropharyngeal exudate.  Eyes: Conjunctivae are normal. No scleral icterus.  Cardiovascular: Normal rate and regular rhythm.   Pulmonary/Chest: Effort normal. No respiratory distress.  Neurological: He is alert and oriented to person, place, and time.  Skin: He is not diaphoretic.     Assessment & Plan:   See Encounters Tab for problem based charting.  Patient discussed with Dr. Eppie Gibson

## 2016-03-04 NOTE — Addendum Note (Signed)
Addended by: Riccardo Dubin on: 03/04/2016 06:02 PM   Modules accepted: Orders

## 2016-03-06 NOTE — Progress Notes (Signed)
Case discussed with Dr. Patel soon after the resident saw the patient. We reviewed the resident's history and exam and pertinent patient test results. I agree with the assessment, diagnosis, and plan of care documented in the resident's note. 

## 2016-03-12 ENCOUNTER — Encounter: Payer: Medicare Other | Admitting: Internal Medicine

## 2016-04-15 DIAGNOSIS — F209 Schizophrenia, unspecified: Secondary | ICD-10-CM | POA: Diagnosis not present

## 2016-04-29 ENCOUNTER — Telehealth: Payer: Self-pay | Admitting: Internal Medicine

## 2016-04-29 NOTE — Telephone Encounter (Signed)
APT. REMINDER CALL, LMTCB °

## 2016-04-30 ENCOUNTER — Ambulatory Visit (INDEPENDENT_AMBULATORY_CARE_PROVIDER_SITE_OTHER): Payer: Medicare Other | Admitting: Internal Medicine

## 2016-04-30 ENCOUNTER — Encounter: Payer: Self-pay | Admitting: Internal Medicine

## 2016-04-30 ENCOUNTER — Encounter (INDEPENDENT_AMBULATORY_CARE_PROVIDER_SITE_OTHER): Payer: Self-pay

## 2016-04-30 VITALS — BP 143/76 | HR 74 | Temp 98.1°F | Ht 65.5 in | Wt 220.8 lb

## 2016-04-30 DIAGNOSIS — I10 Essential (primary) hypertension: Secondary | ICD-10-CM | POA: Diagnosis present

## 2016-04-30 DIAGNOSIS — D649 Anemia, unspecified: Secondary | ICD-10-CM | POA: Diagnosis not present

## 2016-04-30 DIAGNOSIS — Z79899 Other long term (current) drug therapy: Secondary | ICD-10-CM | POA: Diagnosis not present

## 2016-04-30 DIAGNOSIS — K7030 Alcoholic cirrhosis of liver without ascites: Secondary | ICD-10-CM

## 2016-04-30 DIAGNOSIS — C8590 Non-Hodgkin lymphoma, unspecified, unspecified site: Secondary | ICD-10-CM | POA: Diagnosis not present

## 2016-04-30 DIAGNOSIS — G629 Polyneuropathy, unspecified: Secondary | ICD-10-CM | POA: Diagnosis not present

## 2016-04-30 DIAGNOSIS — Z87891 Personal history of nicotine dependence: Secondary | ICD-10-CM

## 2016-04-30 DIAGNOSIS — D696 Thrombocytopenia, unspecified: Secondary | ICD-10-CM

## 2016-04-30 DIAGNOSIS — K7460 Unspecified cirrhosis of liver: Secondary | ICD-10-CM | POA: Diagnosis not present

## 2016-04-30 DIAGNOSIS — Z8619 Personal history of other infectious and parasitic diseases: Secondary | ICD-10-CM

## 2016-04-30 HISTORY — DX: Polyneuropathy, unspecified: G62.9

## 2016-04-30 MED ORDER — HYDROCHLOROTHIAZIDE 25 MG PO TABS: 25.0000 mg | ORAL_TABLET | Freq: Every day | ORAL | 1 refills | Status: DC

## 2016-04-30 MED ORDER — GABAPENTIN 300 MG PO CAPS: 300.0000 mg | ORAL_CAPSULE | Freq: Three times a day (TID) | ORAL | 2 refills | Status: DC

## 2016-04-30 NOTE — Progress Notes (Signed)
   CC: Follow-up for hypertension and medication refills  HPI:  [name] is a 56 y.o. man with past medical history of Non-Hodgkins Lymphoma in remission, HTN, treated Hep C with associated cirrhosis, and schizophrenia who presents to the clinic today for follow up regarding his blood pressure. Patient is doing well today and he has no acute complaints. He has been doing well and is exercising 5-6 times weekly with weights and walking. He was last seen in November with viral pharyngitis which resolved within 2-3 weeks. He is on his last month refills of several medications as his main reason for coming in today.  See problem based assessment and plan below for additional details  Past Medical History:  Diagnosis Date  . Cancer ([place])    lymphoma  . Hemorrhoids   . Hepatitis C   . Hypertension   . Non Hodgkin's lymphoma ([place])   . Pulmonary nodule 07/08/2015  . Rectal bleeding   . Schizophrenia ([place])     Review of Systems:  Review of Systems  Constitutional: Negative for fever.  HENT: Negative for congestion.   Eyes: Negative for redness.  Respiratory: Negative for shortness of breath.   Cardiovascular: Negative for chest pain and leg swelling.  Gastrointestinal: Negative for diarrhea.  Genitourinary: Negative for frequency.  Musculoskeletal: Positive for joint pain.  Skin: Negative for rash.  Neurological: Negative for sensory change and headaches.    Physical Exam: Physical Exam  Constitutional: He is oriented to person, place, and time and well-developed, well-nourished, and in no distress.  HENT:  Mouth/Throat: Oropharynx is clear and moist.  Eyes: No scleral icterus.  Cardiovascular: Normal rate.   Pulmonary/Chest: Effort normal and breath sounds normal.  Abdominal: Soft. There is no tenderness.  Musculoskeletal: He exhibits no edema.  Lymphadenopathy:    He has no cervical adenopathy.  Neurological: He is alert and oriented to person, place, and time.    Psychiatric:  Somewhat flat affect    Vitals:   04/30/16 1432  BP: (!) 143/76  Pulse: 74  Temp: 98.1 F (36.7 C)  TempSrc: Other (Comment)  SpO2: 99%  Weight: 220 lb 12.8 oz (100.2 kg)  Height: 5' 5.5" (1.664 m)    Assessment & Plan:   See Encounters Tab for problem based charting.  Patient discussed with Dr. Angelia Mould

## 2016-04-30 NOTE — Patient Instructions (Signed)
It was a pleasure to see you today [name].  I'm glad you're doing so well. Exercising daily is the best thing he can be doing for yourself.  Your blood pressure remains mildly elevated today so likely to change to taking the hydrochlorothiazide 25 mg daily the next time you refill your medicine.  We are also checking your blood work today to make sure your liver function and kidneys are doing great. I'll send a letter or call you with the results and will definitely call you if there is a problem that needs to change to our treatment plan.

## 2016-05-01 LAB — CBC
Hematocrit: 39.1 % (ref 37.5–51.0)
Hemoglobin: 12.8 g/dL — ABNORMAL LOW (ref 13.0–17.7)
MCH: 25.8 pg — ABNORMAL LOW (ref 26.6–33.0)
MCHC: 32.7 g/dL (ref 31.5–35.7)
MCV: 79 fL (ref 79–97)
Platelets: 143 10*3/uL — ABNORMAL LOW (ref 150–379)
RBC: 4.97 x10E6/uL (ref 4.14–5.80)
RDW: 13.8 % (ref 12.3–15.4)
WBC: 4.7 10*3/uL (ref 3.4–10.8)

## 2016-05-01 LAB — CMP14 + ANION GAP
ALT: 16 IU/L (ref 0–44)
AST: 17 IU/L (ref 0–40)
Albumin/Globulin Ratio: 1.4 (ref 1.2–2.2)
Albumin: 4.2 g/dL (ref 3.5–5.5)
Alkaline Phosphatase: 72 IU/L (ref 39–117)
Anion Gap: 17 mmol/L (ref 10.0–18.0)
BUN/Creatinine Ratio: 12 (ref 9–20)
BUN: 11 mg/dL (ref 6–24)
Bilirubin Total: 0.4 mg/dL (ref 0.0–1.2)
CO2: 24 mmol/L (ref 18–29)
Calcium: 9.1 mg/dL (ref 8.7–10.2)
Chloride: 102 mmol/L (ref 96–106)
Creatinine, Ser: 0.94 mg/dL (ref 0.76–1.27)
GFR calc Af Amer: 104 mL/min/{1.73_m2} (ref >59)
GFR calc non Af Amer: 90 mL/min/{1.73_m2} (ref >59)
Globulin, Total: 2.9 g/dL (ref 1.5–4.5)
Glucose: 95 mg/dL (ref 65–99)
Potassium: 3.7 mmol/L (ref 3.5–5.2)
Sodium: 143 mmol/L (ref 134–144)
Total Protein: 7.1 g/dL (ref 6.0–8.5)

## 2016-05-03 NOTE — Assessment & Plan Note (Addendum)
Hepatic Cirrhosis now status post successful treatment of his hep C confirmed last year. He has some very low-grade anemia and thrombocytopenia. He is is also followed by oncology for his non-Hodgkin's lymphoma in remission with annual CT scan including abdomen. There [place] no evidence of decompensated cirrhosis on examination today.  Include CBC and hepatic function panel along with metabolic panel today

## 2016-05-03 NOTE — Assessment & Plan Note (Signed)
Mr. Canjura blood pressure is mildly uncontrolled today at 143/76. He has not been seen in our clinic for this since 2016 at which time he was controlled. He reports very good compliance on his HCTZ 12.5 mg daily. He's been very physically active which is great and I strongly encouraged him to continue this.  -Increase HCTZ to 25 mg daily -Check metabolic panel today

## 2016-05-03 NOTE — Assessment & Plan Note (Signed)
He takes gabapentin 3 times daily and reports very minimal symptoms if at all at the time of this visit. He is very eager to refill this medicine and continue it today. His functional status is high and he is not having any adverse effects from the medicine. I think in the future the need for this current dose could be reassessed though.  Refilled gabapentin 300 mg 3 times a day

## 2016-05-04 NOTE — Progress Notes (Signed)
Internal Medicine Clinic Attending  Case discussed with Dr. Rice at the time of the visit.  We reviewed the resident's history and exam and pertinent patient test results.  I agree with the assessment, diagnosis, and plan of care documented in the resident's note.  

## 2016-06-09 DIAGNOSIS — F209 Schizophrenia, unspecified: Secondary | ICD-10-CM | POA: Diagnosis not present

## 2016-06-10 DIAGNOSIS — Z79899 Other long term (current) drug therapy: Secondary | ICD-10-CM | POA: Diagnosis not present

## 2016-07-30 ENCOUNTER — Encounter (INDEPENDENT_AMBULATORY_CARE_PROVIDER_SITE_OTHER): Payer: Self-pay

## 2016-07-30 ENCOUNTER — Ambulatory Visit (INDEPENDENT_AMBULATORY_CARE_PROVIDER_SITE_OTHER): Payer: Medicare Other | Admitting: Internal Medicine

## 2016-07-30 ENCOUNTER — Encounter: Payer: Self-pay | Admitting: Internal Medicine

## 2016-07-30 VITALS — BP 136/75 | HR 73 | Temp 98.0°F | Ht 65.5 in | Wt 224.0 lb

## 2016-07-30 DIAGNOSIS — I10 Essential (primary) hypertension: Secondary | ICD-10-CM | POA: Diagnosis not present

## 2016-07-30 DIAGNOSIS — Z87891 Personal history of nicotine dependence: Secondary | ICD-10-CM | POA: Diagnosis not present

## 2016-07-30 DIAGNOSIS — K429 Umbilical hernia without obstruction or gangrene: Secondary | ICD-10-CM | POA: Diagnosis not present

## 2016-07-30 DIAGNOSIS — Z6836 Body mass index (BMI) 36.0-36.9, adult: Secondary | ICD-10-CM

## 2016-07-30 DIAGNOSIS — Z79899 Other long term (current) drug therapy: Secondary | ICD-10-CM | POA: Diagnosis not present

## 2016-07-30 HISTORY — DX: Umbilical hernia without obstruction or gangrene: K42.9

## 2016-07-30 NOTE — Progress Notes (Signed)
   CC: Follow up for hypertension  HPI:  [name] is a 56 y.o. man here today at a 3 month follow up after increasing his antihypertensive medication and without other new complaints.   See problem based assessment and plan below for additional details  Past Medical History:  Diagnosis Date  . Cancer ([place])    lymphoma  . Hemorrhoids   . Hepatitis C   . Hypertension   . Non Hodgkin's lymphoma ([place])   . Pulmonary nodule 07/08/2015  . Rectal bleeding   . Schizophrenia ([place])     Review of Systems:  Review of Systems  Respiratory: Negative for shortness of breath.   Cardiovascular: Negative for chest pain and leg swelling.  Gastrointestinal: Negative for constipation and diarrhea.  Musculoskeletal: Negative for falls.  Neurological: Negative for headaches.    Physical Exam: Physical Exam  Constitutional: He appears distressed.  Cardiovascular: Normal rate and regular rhythm.   Pulmonary/Chest: Effort normal and breath sounds normal.  Abdominal: Soft. He exhibits no distension. There is no tenderness.  Reducible, painless umbilical hernia present  Musculoskeletal: Normal range of motion. He exhibits no edema.  Skin: Skin is warm and dry.  Psychiatric:  Slightly flat affect    Vitals:   07/30/16 1449  BP: 136/75  Pulse: 73  Temp: 98 F (36.7 C)  TempSrc: Oral  SpO2: 99%  Weight: 224 lb (101.6 kg)  Height: 5' 5.5" (1.664 m)    Assessment & Plan:   See Encounters Tab for problem based charting.  Patient discussed with Dr. Angelia Mould

## 2016-07-30 NOTE — Patient Instructions (Signed)
It was a pleasure to see you today [name].  I am glad you are doing so well today!  Let's continue the current treatments for your blood pressure and neuropathy and we can see you again in about 6 months if things keep going well.  I would like to check your cholesterol levels again today since it has been a few years and your medications do carry a risk of causing abnormally high cholesterol levels. I will call you if this is very high and we need to change any plans.

## 2016-07-31 LAB — LIPID PANEL
Chol/HDL Ratio: 3.8 ratio (ref 0.0–5.0)
Cholesterol, Total: 151 mg/dL (ref 100–199)
HDL: 40 mg/dL (ref >39)
LDL Calculated: 74 mg/dL (ref 0–99)
Triglycerides: 186 mg/dL — ABNORMAL HIGH (ref 0–149)
VLDL Cholesterol Cal: 37 mg/dL (ref 5–40)

## 2016-08-02 NOTE — Assessment & Plan Note (Addendum)
[name] is complaining he has not managed to lose much weight despite trying to stay physically active most days per week. In particular he feels he has a lot of abdominal fat. We discussed this for several minutes with some diet recommendations particularly encouraged reduction of simple sugars and starches where able such as reduced soda consumption. This is of particular value for him since he is on several medications for schizophrenia that increase the risk for diabetes and hyperlipidemia. He has consistently normal random glucose on chemistry panels but [name] lipids have been checked since 2014 so we will check this again today.

## 2016-08-02 NOTE — Assessment & Plan Note (Signed)
HPI: [name] has continued exercising walking and lifting weights 4-5 days per week. He has no complaints since adjusting his HCTZ dose last visit. Blood pressure is controlled today at 136/75.  A: Well controlled BP on HCTZ 25mg   P: Congratulated Mr. Brigham on his work to stay active and taking his treatment very consistently Recommend continuing HCTZ 25mg  PO daily We can see him again in 6 months

## 2016-08-03 NOTE — Progress Notes (Signed)
Internal Medicine Clinic Attending  Case discussed with Dr. Rice at the time of the visit.  We reviewed the resident's history and exam and pertinent patient test results.  I agree with the assessment, diagnosis, and plan of care documented in the resident's note.  

## 2016-11-22 ENCOUNTER — Other Ambulatory Visit: Payer: Self-pay | Admitting: Internal Medicine

## 2016-11-22 DIAGNOSIS — I10 Essential (primary) hypertension: Secondary | ICD-10-CM

## 2016-12-13 ENCOUNTER — Telehealth: Payer: Self-pay | Admitting: Internal Medicine

## 2016-12-13 NOTE — Telephone Encounter (Signed)
Left message for patient regarding appointment on 9/17. °

## 2017-01-03 ENCOUNTER — Other Ambulatory Visit: Payer: Medicare Other

## 2017-01-06 ENCOUNTER — Ambulatory Visit (HOSPITAL_COMMUNITY)
Admission: RE | Admit: 2017-01-06 | Discharge: 2017-01-06 | Disposition: A | Discharge status: Home / Self Care | Payer: Medicare Other | Source: Ambulatory Visit | Attending: Internal Medicine | Admitting: Internal Medicine

## 2017-01-06 ENCOUNTER — Other Ambulatory Visit (HOSPITAL_BASED_OUTPATIENT_CLINIC_OR_DEPARTMENT_OTHER): Payer: Medicare Other

## 2017-01-06 DIAGNOSIS — C8332 Diffuse large B-cell lymphoma, intrathoracic lymph nodes: Secondary | ICD-10-CM

## 2017-01-06 DIAGNOSIS — K429 Umbilical hernia without obstruction or gangrene: Secondary | ICD-10-CM | POA: Insufficient documentation

## 2017-01-06 DIAGNOSIS — K4090 Unilateral inguinal hernia, without obstruction or gangrene, not specified as recurrent: Secondary | ICD-10-CM | POA: Diagnosis not present

## 2017-01-06 DIAGNOSIS — R161 Splenomegaly, not elsewhere classified: Secondary | ICD-10-CM | POA: Insufficient documentation

## 2017-01-06 DIAGNOSIS — K7030 Alcoholic cirrhosis of liver without ascites: Secondary | ICD-10-CM | POA: Diagnosis present

## 2017-01-06 DIAGNOSIS — C8590 Non-Hodgkin lymphoma, unspecified, unspecified site: Secondary | ICD-10-CM

## 2017-01-06 DIAGNOSIS — J439 Emphysema, unspecified: Secondary | ICD-10-CM | POA: Insufficient documentation

## 2017-01-06 LAB — COMPREHENSIVE METABOLIC PANEL
ALT: 28 U/L (ref 0–55)
AST: 27 U/L (ref 5–34)
Albumin: 3.9 g/dL (ref 3.5–5.0)
Alkaline Phosphatase: 62 U/L (ref 40–150)
Anion Gap: 10 mEq/L (ref 3–11)
BUN: 12.2 mg/dL (ref 7.0–26.0)
CO2: 26 mEq/L (ref 22–29)
Calcium: 10 mg/dL (ref 8.4–10.4)
Chloride: 105 mEq/L (ref 98–109)
Creatinine: 1.2 mg/dL (ref 0.7–1.3)
EGFR: 81 mL/min/{1.73_m2} — ABNORMAL LOW (ref >90)
Glucose: 101 mg/dl (ref 70–140)
Potassium: 4.5 mEq/L (ref 3.5–5.1)
Sodium: 142 mEq/L (ref 136–145)
Total Bilirubin: 0.91 mg/dL (ref 0.20–1.20)
Total Protein: 8.3 g/dL (ref 6.4–8.3)

## 2017-01-06 LAB — CBC WITH DIFFERENTIAL/PLATELET
BASO%: 1.1 % (ref 0.0–2.0)
Basophils Absolute: 0.1 10*3/uL (ref 0.0–0.1)
EOS%: 0.8 % (ref 0.0–7.0)
Eosinophils Absolute: 0 10*3/uL (ref 0.0–0.5)
HCT: 41.8 % (ref 38.4–49.9)
HGB: 13.8 g/dL (ref 13.0–17.1)
LYMPH%: 18.8 % (ref 14.0–49.0)
MCH: 25.8 pg — ABNORMAL LOW (ref 27.2–33.4)
MCHC: 33 g/dL (ref 32.0–36.0)
MCV: 78.4 fL — ABNORMAL LOW (ref 79.3–98.0)
MONO#: 0.6 10*3/uL (ref 0.1–0.9)
MONO%: 9 % (ref 0.0–14.0)
NEUT#: 4.3 10*3/uL (ref 1.5–6.5)
NEUT%: 70.3 % (ref 39.0–75.0)
Platelets: 147 10*3/uL (ref 140–400)
RBC: 5.33 10*6/uL (ref 4.20–5.82)
RDW: 14 % (ref 11.0–14.6)
WBC: 6.2 10*3/uL (ref 4.0–10.3)
lymph#: 1.2 10*3/uL (ref 0.9–3.3)

## 2017-01-06 LAB — LACTATE DEHYDROGENASE: LDH: 172 U/L (ref 125–245)

## 2017-01-06 MED ORDER — IOPAMIDOL (ISOVUE-300) INJECTION 61%
INTRAVENOUS | Status: AC
  Filled 2017-01-06: qty 100

## 2017-01-06 MED ORDER — IOPAMIDOL (ISOVUE-300) INJECTION 61%
100.0000 mL | Freq: Once | INTRAVENOUS | Status: AC | PRN
  Administered 2017-01-06: 100 mL via INTRAVENOUS

## 2017-01-10 ENCOUNTER — Other Ambulatory Visit: Payer: Medicare Other

## 2017-01-10 ENCOUNTER — Ambulatory Visit: Payer: Self-pay | Admitting: Internal Medicine

## 2017-01-10 ENCOUNTER — Ambulatory Visit: Payer: Medicare Other | Admitting: Internal Medicine

## 2017-01-14 ENCOUNTER — Telehealth: Payer: Self-pay | Admitting: *Deleted

## 2017-01-14 NOTE — Telephone Encounter (Signed)
Call to pt regarding after hours concern. Pt verbalized he will be at 9/25 appt with Dr. Julien Nordmann. No further concerns.

## 2017-01-18 ENCOUNTER — Encounter: Payer: Self-pay | Admitting: Internal Medicine

## 2017-01-18 ENCOUNTER — Ambulatory Visit (HOSPITAL_BASED_OUTPATIENT_CLINIC_OR_DEPARTMENT_OTHER): Payer: Medicare Other | Admitting: Internal Medicine

## 2017-01-18 ENCOUNTER — Telehealth: Payer: Self-pay | Admitting: Internal Medicine

## 2017-01-18 VITALS — BP 143/77 | HR 78 | Temp 98.1°F | Resp 18 | Ht 65.5 in | Wt 225.5 lb

## 2017-01-18 DIAGNOSIS — B1920 Unspecified viral hepatitis C without hepatic coma: Secondary | ICD-10-CM | POA: Diagnosis not present

## 2017-01-18 DIAGNOSIS — C8590 Non-Hodgkin lymphoma, unspecified, unspecified site: Secondary | ICD-10-CM

## 2017-01-18 DIAGNOSIS — C8332 Diffuse large B-cell lymphoma, intrathoracic lymph nodes: Secondary | ICD-10-CM

## 2017-01-18 NOTE — Telephone Encounter (Signed)
Gave avs and calendar for September 2019 °

## 2017-01-18 NOTE — Progress Notes (Signed)
[facility] [phone]   [phone]  OFFICE PROGRESS NOTE  PRINCIPAL DIAGNOSIS: Stage IV non-Hodgkin lymphoma diagnosed in March 2009.   PRIOR THERAPY:  1. Status post 4 weekly doses of Rituxan during the patient's hospitalization at the intensive care unit at [facility] with respiratory failure. 2. Status post 7 cycles of systemic chemotherapy with CHOP/Rituxan. Last dose was given January 15, 2008.  CURRENT THERAPY: Observation.  INTERVAL HISTORY: [name] 56 y.o. male returns to the clinic today for follow-up visit. The patient is feeling fine today was no specific complaints. He denied having any chest pain, shortness breath, cough or hemoptysis. He denied having any significant weight loss or night sweats. He has no palpable lymphadenopathy.the patient has no nausea, vomiting, diarrhea or constipation. He had repeat CT scan of the chest, abdomen and pelvis performed recently and he is here for evaluation and discussion of his scan results.  MEDICAL HISTORY: Past Medical History:  Diagnosis Date  . Cancer ([place])    lymphoma  . Hemorrhoids   . Hepatitis C   . Hypertension   . Non Hodgkin's lymphoma ([place])   . Pulmonary nodule 07/08/2015  . Rectal bleeding   . Schizophrenia ([place])     ALLERGIES:  has No Known Allergies.  MEDICATIONS:  Current Outpatient Prescriptions  Medication Sig Dispense Refill  . hydrochlorothiazide (HYDRODIURIL) 25 MG tablet TAKE 1 TABLET ONCE DAILY. 90 tablet 0  . OLANZapine (ZYPREXA) 15 MG tablet 1 15 mg tablet and 1 20 mg tablet    . perphenazine (TRILAFON) 8 MG tablet Take 8 mg by mouth 2 (two) times daily.    Marland Kitchen Phenylephrine-DM-GG-APAP (TYLENOL COLD/FLU SEVERE PO) Take by mouth.    . benztropine (COGENTIN) 1 MG tablet Take 1 mg by mouth daily.     No current facility-administered medications for this visit.     REVIEW OF SYSTEMS:  A comprehensive review of systems was negative.   PHYSICAL  EXAMINATION: General appearance: alert, cooperative and no distress Head: Normocephalic, without obvious abnormality, atraumatic Neck: no adenopathy Lymph nodes: Cervical, supraclavicular, and axillary nodes normal. Resp: clear to auscultation bilaterally Back: symmetric, no curvature. ROM normal. No CVA tenderness. Cardio: regular rate and rhythm, S1, S2 normal, no murmur, click, rub or gallop GI: soft, non-tender; bowel sounds normal; no masses,  no organomegaly Extremities: extremities normal, atraumatic, no cyanosis or edema  ECOG PERFORMANCE STATUS: 0 - Asymptomatic  Blood pressure (!) 143/77, pulse 78, temperature 98.1 F (36.7 C), temperature source Oral, resp. rate 18, height 5' 5.5" (1.664 m), weight 225 lb 8 oz (102.3 kg), SpO2 98 %.  LABORATORY DATA: Lab Results  Component Value Date   WBC 6.2 01/06/2017   HGB 13.8 01/06/2017   HCT 41.8 01/06/2017   MCV 78.4 (L) 01/06/2017   PLT 147 01/06/2017      Chemistry      Component Value Date/Time   NA 142 01/06/2017 0945   K 4.5 01/06/2017 0945   CL 102 04/30/2016 1501   CL 107 06/20/2012 1032   CO2 26 01/06/2017 0945   BUN 12.2 01/06/2017 0945   CREATININE 1.2 01/06/2017 0945      Component Value Date/Time   CALCIUM 10.0 01/06/2017 0945   ALKPHOS 62 01/06/2017 0945   AST 27 01/06/2017 0945   ALT 28 01/06/2017 0945   BILITOT 0.91 01/06/2017 0945       RADIOGRAPHIC STUDIES: Ct Chest W Contrast  Result Date: 01/06/2017 CLINICAL  DATA:  Diffuse large B-cell lymphoma. Alcoholic cirrhosis. Restaging. EXAM: CT CHEST, ABDOMEN, AND PELVIS WITH CONTRAST TECHNIQUE: Multidetector CT imaging of the chest, abdomen and pelvis was performed following the standard protocol during bolus administration of intravenous contrast. CONTRAST:  127mL ISOVUE-300 IOPAMIDOL (ISOVUE-300) INJECTION 61% COMPARISON:  CT chest, abdomen and pelvis dated 01/08/2016. FINDINGS: CT CHEST FINDINGS Cardiovascular: Normal heart size. No significant  pericardial fluid/thickening. Great vessels are normal in course and caliber. No central pulmonary emboli. Mediastinum/Nodes: Hypodense subcentimeter bilateral thyroid lobe nodules are stable. Unremarkable esophagus. No pathologically enlarged axillary, mediastinal or hilar lymph nodes. Lungs/Pleura: No pneumothorax. No pleural effusion. Mild centrilobular and paraseptal emphysema with mild diffuse bronchial wall thickening. Peripheral right lower lobe 2 mm solid pulmonary nodule (series 4/ image 82) is stable since at least 06/26/2013 and considered benign. No acute consolidative airspace disease, lung masses or new significant pulmonary nodules. Stable parenchymal banding in the mid lungs compatible with mild postinfectious/ postinflammatory scarring. Musculoskeletal: No aggressive appearing focal osseous lesions. Stable relatively symmetric mild gynecomastia. CT ABDOMEN PELVIS FINDINGS Hepatobiliary: Liver surface is diffusely irregular and there is relative hypertrophy of the lateral segment left liver lobe, unchanged, compatible with cirrhosis. No liver masses. Normal gallbladder with no radiopaque cholelithiasis. No biliary ductal dilatation. Pancreas: Normal, with no mass or duct dilation. Spleen: Stable mild splenomegaly (craniocaudal splenic length 13.1 cm). No discrete splenic masses. Stable small contour irregularity at the lateral lower left spleen, favored to represent a small splenule. Adrenals/Urinary Tract: Normal adrenals. Normal kidneys with no hydronephrosis and no renal mass. Normal bladder. Stomach/Bowel: Grossly normal stomach. Normal caliber small bowel with no small bowel wall thickening. Normal appendix. Normal large bowel with no diverticulosis, large bowel wall thickening or pericolonic fat stranding. Vascular/Lymphatic: Normal caliber abdominal aorta. Patent portal, splenic and renal veins. Stable enlarged 1.9 cm left inguinal node (series 2/ image 122). No additional pathologically  enlarged abdominopelvic nodes. Reproductive: Normal size prostate . Other: No pneumoperitoneum, ascites or focal fluid collection. Stable small fat containing periumbilical hernia. Musculoskeletal: No aggressive appearing focal osseous lesions. IMPRESSION: 1. Stable mildly enlarged left inguinal lymph node. Otherwise no new lymphadenopathy or other findings of recurrent lymphoma in the chest, abdomen or pelvis. 2. Stable morphologic changes of cirrhosis.  No liver masses. 3. Stable mild splenomegaly.  No ascites. 4.  Emphysema (ICD10-J43.9). 5. Stable small fat containing periumbilical hernia. Electronically Signed   By: Ilona Sorrel M.D.   On: 01/06/2017 16:03   Ct Abdomen Pelvis W Contrast  Result Date: 01/06/2017 CLINICAL DATA:  Diffuse large B-cell lymphoma. Alcoholic cirrhosis. Restaging. EXAM: CT CHEST, ABDOMEN, AND PELVIS WITH CONTRAST TECHNIQUE: Multidetector CT imaging of the chest, abdomen and pelvis was performed following the standard protocol during bolus administration of intravenous contrast. CONTRAST:  197mL ISOVUE-300 IOPAMIDOL (ISOVUE-300) INJECTION 61% COMPARISON:  CT chest, abdomen and pelvis dated 01/08/2016. FINDINGS: CT CHEST FINDINGS Cardiovascular: Normal heart size. No significant pericardial fluid/thickening. Great vessels are normal in course and caliber. No central pulmonary emboli. Mediastinum/Nodes: Hypodense subcentimeter bilateral thyroid lobe nodules are stable. Unremarkable esophagus. No pathologically enlarged axillary, mediastinal or hilar lymph nodes. Lungs/Pleura: No pneumothorax. No pleural effusion. Mild centrilobular and paraseptal emphysema with mild diffuse bronchial wall thickening. Peripheral right lower lobe 2 mm solid pulmonary nodule (series 4/ image 82) is stable since at least 06/26/2013 and considered benign. No acute consolidative airspace disease, lung masses or new significant pulmonary nodules. Stable parenchymal banding in the mid lungs compatible with  mild postinfectious/ postinflammatory scarring. Musculoskeletal: No aggressive  appearing focal osseous lesions. Stable relatively symmetric mild gynecomastia. CT ABDOMEN PELVIS FINDINGS Hepatobiliary: Liver surface is diffusely irregular and there is relative hypertrophy of the lateral segment left liver lobe, unchanged, compatible with cirrhosis. No liver masses. Normal gallbladder with no radiopaque cholelithiasis. No biliary ductal dilatation. Pancreas: Normal, with no mass or duct dilation. Spleen: Stable mild splenomegaly (craniocaudal splenic length 13.1 cm). No discrete splenic masses. Stable small contour irregularity at the lateral lower left spleen, favored to represent a small splenule. Adrenals/Urinary Tract: Normal adrenals. Normal kidneys with no hydronephrosis and no renal mass. Normal bladder. Stomach/Bowel: Grossly normal stomach. Normal caliber small bowel with no small bowel wall thickening. Normal appendix. Normal large bowel with no diverticulosis, large bowel wall thickening or pericolonic fat stranding. Vascular/Lymphatic: Normal caliber abdominal aorta. Patent portal, splenic and renal veins. Stable enlarged 1.9 cm left inguinal node (series 2/ image 122). No additional pathologically enlarged abdominopelvic nodes. Reproductive: Normal size prostate . Other: No pneumoperitoneum, ascites or focal fluid collection. Stable small fat containing periumbilical hernia. Musculoskeletal: No aggressive appearing focal osseous lesions. IMPRESSION: 1. Stable mildly enlarged left inguinal lymph node. Otherwise no new lymphadenopathy or other findings of recurrent lymphoma in the chest, abdomen or pelvis. 2. Stable morphologic changes of cirrhosis.  No liver masses. 3. Stable mild splenomegaly.  No ascites. 4.  Emphysema (ICD10-J43.9). 5. Stable small fat containing periumbilical hernia. Electronically Signed   By: Ilona Sorrel M.D.   On: 01/06/2017 16:03    ASSESSMENT AND PLAN: This is a very pleasant  56 years old [place] American male with history of stage IV non-Hodgkin lymphoma diagnosed in March of 2009 status post systemic chemotherapy and has been observation since September of 2009 with no evidence for disease recurrence.  The recent CT scan of the chest, abdomen and pelvis showed no significant evidence for disease recurrence. I discussed the scan results with the patient today. I recommended for him to continue on observation with repeat CT scan of the chest, abdomen and pelvis in one year. He was advised to call immediately if he has any concerning symptoms in the interval. All questions were answered. The patient knows to call the clinic with any problems, questions or concerns. We can certainly see the patient much sooner if necessary.  Disclaimer: This note was dictated with voice recognition software. Similar sounding words can inadvertently be transcribed and may not be corrected upon review.

## 2017-01-28 ENCOUNTER — Encounter: Payer: Medicare Other | Admitting: Internal Medicine

## 2017-02-15 ENCOUNTER — Ambulatory Visit: Payer: Medicaid Other | Admitting: Podiatry

## 2017-02-24 ENCOUNTER — Ambulatory Visit (INDEPENDENT_AMBULATORY_CARE_PROVIDER_SITE_OTHER): Payer: Medicare Other | Admitting: Podiatry

## 2017-02-24 ENCOUNTER — Encounter: Payer: Self-pay | Admitting: Podiatry

## 2017-02-24 DIAGNOSIS — M79675 Pain in left toe(s): Secondary | ICD-10-CM

## 2017-02-24 DIAGNOSIS — B351 Tinea unguium: Secondary | ICD-10-CM

## 2017-02-24 DIAGNOSIS — M79674 Pain in right toe(s): Secondary | ICD-10-CM | POA: Diagnosis not present

## 2017-02-24 NOTE — Progress Notes (Signed)
Subjective:    Patient ID: [name], male    DOB: 1960/09/03, 56 y.o.   MRN: [id number]  HPI  56 year old male presents the office today for concerns of thick, painful, elongated toenails that he cannot trim himself. He states that he was using some topical ointment several years ago no right-sided very well but is starting to get worse in the left side. He states the nails to get painful with pressure in shoes. Denies any redness or drainage or any swelling, and the toenail sites. He has no other concerns today.  Review of Systems  All other systems reviewed and are negative.  Past Medical History:  Diagnosis Date  . Cancer ([place])    lymphoma  . Hemorrhoids   . Hepatitis C   . Hypertension   . Non Hodgkin's lymphoma ([place])   . Pulmonary nodule 07/08/2015  . Rectal bleeding   . Schizophrenia ([place])     No past surgical history on file.   Current Outpatient Medications:  .  benztropine (COGENTIN) 1 MG tablet, Take 1 mg by mouth daily., Disp: , Rfl:  .  hydrochlorothiazide (HYDRODIURIL) 25 MG tablet, TAKE 1 TABLET ONCE DAILY., Disp: 90 tablet, Rfl: 0 .  OLANZapine (ZYPREXA) 15 MG tablet, 1 15 mg tablet and 1 20 mg tablet, Disp: , Rfl:  .  perphenazine (TRILAFON) 8 MG tablet, Take 8 mg by mouth 2 (two) times daily., Disp: , Rfl:  .  Phenylephrine-DM-GG-APAP (TYLENOL COLD/FLU SEVERE PO), Take by mouth., Disp: , Rfl:   No Known Allergies  Social History   Socioeconomic History  . Marital status: Single    Spouse name: Not on file  . Number of children: Not on file  . Years of education: Not on file  . Highest education level: Not on file  Social Needs  . Financial resource strain: Not on file  . Food insecurity - worry: Not on file  . Food insecurity - inability: Not on file  . Transportation needs - medical: Not on file  . Transportation needs - non-medical: Not on file  Occupational History  . Not on file  Tobacco Use  . Smoking status: Former Smoker    Last attempt  to quit: 04/26/1988    Years since quitting: 28.8  . Smokeless tobacco: Never Used  Substance and Sexual Activity  . Alcohol use: No    Alcohol/week: 0.0 oz  . Drug use: No  . Sexual activity: Yes  Other Topics Concern  . Not on file  Social History Narrative  . Not on file        Objective:   Physical Exam General: AAO x3, NAD  Dermatological: Nails are hypertrophic, dystrophic, brittle, discolored, elongated 10. No surrounding redness or drainage. Tenderness nails 1-5 bilaterally. No open lesions or pre-ulcerative lesions are identified today.  Vascular: Dorsalis Pedis artery and Posterior Tibial artery pedal pulses are 2/4 bilateral with immedate capillary fill time.  There is no pain with calf compression, swelling, warmth, erythema.   Neruologic: Grossly intact via light touch bilateral.  Protective threshold with Semmes Wienstein monofilament intact to all pedal sites bilateral.   Musculoskeletal: No gross boney pedal deformities bilateral. No pain, crepitus, or limitation noted with foot and ankle range of motion bilateral. Muscular strength 5/5 in all groups tested bilateral.  Gait: Unassisted, Nonantalgic.     Assessment & Plan:  56 year old male with symptomatic onychomycosis -Treatment options discussed including all alternatives, risks, and complications -Etiology of symptoms were discussed -Nails debrided  10 without complications or bleeding. -Discussed compound cream for onychomycosis. I ordered this debrided [employer].  -Daily foot inspection -Follow-up in 3 months or sooner if any problems arise. In the meantime, encouraged to call the office with any questions, concerns, change in symptoms.   Celesta Gentile, DPM

## 2017-02-25 ENCOUNTER — Encounter: Payer: Self-pay | Admitting: Internal Medicine

## 2017-02-25 ENCOUNTER — Other Ambulatory Visit: Payer: Self-pay | Admitting: Internal Medicine

## 2017-02-25 ENCOUNTER — Ambulatory Visit (INDEPENDENT_AMBULATORY_CARE_PROVIDER_SITE_OTHER): Payer: Medicare Other | Admitting: Internal Medicine

## 2017-02-25 VITALS — BP 155/72 | HR 82 | Temp 98.1°F | Ht 65.5 in | Wt 225.6 lb

## 2017-02-25 DIAGNOSIS — L818 Other specified disorders of pigmentation: Secondary | ICD-10-CM | POA: Diagnosis not present

## 2017-02-25 DIAGNOSIS — R05 Cough: Secondary | ICD-10-CM | POA: Diagnosis not present

## 2017-02-25 DIAGNOSIS — I10 Essential (primary) hypertension: Secondary | ICD-10-CM | POA: Diagnosis not present

## 2017-02-25 DIAGNOSIS — Z0000 Encounter for general adult medical examination without abnormal findings: Secondary | ICD-10-CM

## 2017-02-25 DIAGNOSIS — Z87891 Personal history of nicotine dependence: Secondary | ICD-10-CM | POA: Diagnosis not present

## 2017-02-25 NOTE — Patient Instructions (Addendum)
It was a pleasure to see you today [name].  I am glad you are feeling well. Your blood pressure was slightly high so I would like you to start checking this at home about once per day. If you are getting high numbers we probably need to start a second medication for your blood pressure.  I am glad your foot pain is not too bad and you are keeping up with your doctors.

## 2017-02-25 NOTE — Progress Notes (Signed)
   CC: Follow up for hypertension  HPI:  [name] is a 56 y.o. male with PMHx detailed below presenting with follow up for his hypertension needing medication refills.  See problem based assessment and plan below for additional details.  Essential hypertension HPI: He continues taking daily medication and exercising at least 4 times weekly. His blood pressure is moderately elevated today at 155/72 without any obvious change in behavior, weight, or treatment. He has no symptomatic complaints. He has a home automated cuff that he is comfortable using. A: Hypertension uncontrolled above goal <140/90 This is his first visit uncontrolled on the current regimen. P: Provided a blood pressure log and requested he start 1-2 times daily monitoring. At follow up if he is above goal in clinic and per log will need second agent started  Healthcare maintenance He has already received flu vaccine this year    Past Medical History:  Diagnosis Date  . Cancer ([place])    lymphoma  . Hemorrhoids   . Hepatitis C   . Hypertension   . Non Hodgkin's lymphoma ([place])   . Pulmonary nodule 07/08/2015  . Rectal bleeding   . Schizophrenia ([place])     Review of Systems: Review of Systems  Constitutional: Negative for malaise/fatigue and weight loss.  Eyes: Negative for blurred vision.  Respiratory: Positive for cough. Negative for shortness of breath.   Cardiovascular: Negative for chest pain and leg swelling.  Gastrointestinal: Negative for constipation and diarrhea.     Physical Exam: Vitals:   02/25/17 1343  BP: (!) 155/72  Pulse: 82  Temp: 98.1 F (36.7 C)  TempSrc: Oral  SpO2: 97%  Weight: 225 lb 9.6 oz (102.3 kg)  Height: 5' 5.5" (1.664 m)   GENERAL- alert, co-operative, NAD HEENT- Atraumatic, PERRL, EOMI, oral mucosa appears moist, good and intact dentition, no cervical LN enlargement. CARDIAC- RRR, no murmurs, rubs or gallops. RESP- CTAB, no wheezes or crackles. ABDOMEN- Soft,  nontender, no guarding or rebound EXTREMITIES- pulse 2+, symmetric, no pedal edema, recently trimmed nails, mild discoloration on 3rd toe. SKIN- Warm, dry, No rash or lesion. PSYCH- Affect somewhat flat but friendly, appropriate thought content and speech.    Assessment & Plan:   See encounters tab for problem based medical decision making.   Patient discussed with Dr. Rebeca Alert

## 2017-02-26 NOTE — Assessment & Plan Note (Signed)
He has already received flu vaccine this year

## 2017-02-26 NOTE — Assessment & Plan Note (Signed)
HPI: He continues taking daily medication and exercising at least 4 times weekly. His blood pressure is moderately elevated today at 155/72 without any obvious change in behavior, weight, or treatment. He has no symptomatic complaints. He has a home automated cuff that he is comfortable using. A: Hypertension uncontrolled above goal <140/90 This is his first visit uncontrolled on the current regimen. P: Provided a blood pressure log and requested he start 1-2 times daily monitoring. At follow up if he is above goal in clinic and per log will need second agent started

## 2017-02-28 NOTE — Progress Notes (Signed)
Internal Medicine Clinic Attending  Case discussed with Dr. Rice  at the time of the visit.  We reviewed the resident's history and exam and pertinent patient test results.  I agree with the assessment, diagnosis, and plan of care documented in the resident's note.  Gwenivere Hiraldo N Estefan Pattison, MD   

## 2017-04-29 ENCOUNTER — Other Ambulatory Visit: Payer: Self-pay

## 2017-04-29 ENCOUNTER — Ambulatory Visit (INDEPENDENT_AMBULATORY_CARE_PROVIDER_SITE_OTHER): Payer: Medicare Other | Admitting: Internal Medicine

## 2017-04-29 ENCOUNTER — Encounter: Payer: Self-pay | Admitting: Internal Medicine

## 2017-04-29 ENCOUNTER — Encounter (INDEPENDENT_AMBULATORY_CARE_PROVIDER_SITE_OTHER): Payer: Self-pay

## 2017-04-29 VITALS — BP 139/77 | HR 74 | Temp 98.0°F | Ht 65.5 in | Wt 227.7 lb

## 2017-04-29 DIAGNOSIS — R5382 Chronic fatigue, unspecified: Secondary | ICD-10-CM | POA: Diagnosis not present

## 2017-04-29 DIAGNOSIS — Z79899 Other long term (current) drug therapy: Secondary | ICD-10-CM | POA: Diagnosis not present

## 2017-04-29 DIAGNOSIS — Z87891 Personal history of nicotine dependence: Secondary | ICD-10-CM | POA: Diagnosis not present

## 2017-04-29 DIAGNOSIS — I10 Essential (primary) hypertension: Secondary | ICD-10-CM

## 2017-04-29 DIAGNOSIS — R5383 Other fatigue: Secondary | ICD-10-CM | POA: Insufficient documentation

## 2017-04-29 NOTE — Patient Instructions (Signed)
FOLLOW-UP INSTRUCTIONS When: 6 months For: Blood pressure, cholesterol What to bring: Medications  It was a pleasure to see you today [name].  We will check your thyroid function test today and make sure this is not contributing to your fatigue.  Fish oil may be a helpful supplement as you also had mildly elevated triglycerides on your cholesterol test last year.  I will give you a call with recommendations if your test is too low today.

## 2017-04-29 NOTE — Progress Notes (Signed)
   CC: Fatigue  HPI:  [name] is a 57 y.o. male with PMHx detailed below presenting with daily fatigue that has been ongoing for a year.  See problem based assessment and plan below for additional details.  Essential hypertension Since our last visit he has been monitoring his blood pressure at home and logs in clinic today show numbers consistently in 120s-130s. This is consistent with what he reported last year stating his clinic measurements are elevated compared to home. He continues to exercise at least 3 days per week. A: Hypertension at goal <140/90 Appears to have mildly better BP measurements outside of the clinic setting. P: Continue HCTZ 25mg  daily monotherapy  Fatigue He reports daytime fatigue that is ongoing over the past year. He has not seen any recent change for better or worse. He is going to bed around 10pm most nights but sometimes as early as 830pm. He sleeps through the night without awakening for nocturia or dyspnea and awakens usually by around 5am. He sometimes takes 1 hour afternoon naps which slightly improve tiredness. He is taking Zyprexa in the evenings because he does notice this making him more likely to sleep after he takes it. A: Chronic daytime fatigue without any associated constitutional symptoms and with overall good sleep hygiene. This may be contributed by his psychiatric medication but currently he is very well controlled and this fatigue is not activity limiting so I would not challenge his current regimen. P: Check TSH today - normal Follow up for worsening or improvement over next months   Past Medical History:  Diagnosis Date  . Cancer ([place])    lymphoma  . Hemorrhoids   . Hepatitis C   . Hypertension   . Non Hodgkin's lymphoma ([place])   . Pulmonary nodule 07/08/2015  . Rectal bleeding   . Schizophrenia ([place])     Review of Systems: Review of Systems  Constitutional: Positive for malaise/fatigue.  Respiratory: Negative for  shortness of breath.   Cardiovascular: Negative for leg swelling.  Neurological: Negative for sensory change.  Psychiatric/Behavioral: The patient does not have insomnia.      Physical Exam: Vitals:   04/29/17 1543 04/29/17 1600  BP: (!) 148/77 139/77  Pulse: 74 74  Temp: 98 F (36.7 C)   TempSrc: Oral   SpO2: 99%   Weight: 227 lb 11.2 oz (103.3 kg)   Height: 5' 5.5" (1.664 m)    GENERAL- alert, co-operative, NAD HEENT- Oral mucosa appears moist CARDIAC- RRR, no murmurs, rubs or gallops. RESP- CTAB, no wheezes or crackles. NEURO- Reflexes 2+ b/l strength upper and lower extremities- 5/5, Sensation intact globally EXTREMITIES- symmetric, no pedal edema. SKIN- Warm, dry, No rash or lesion. PSYCH- Normal mood and affect, appropriate thought content and speech.   Assessment & Plan:   See encounters tab for problem based medical decision making.   Patient discussed with Dr. Daryll Drown

## 2017-04-30 LAB — TSH: TSH: 1.27 u[IU]/mL (ref 0.450–4.500)

## 2017-05-03 NOTE — Assessment & Plan Note (Addendum)
He reports daytime fatigue that is ongoing over the past year. He has not seen any recent change for better or worse. He is going to bed around 10pm most nights but sometimes as early as 830pm. He sleeps through the night without awakening for nocturia or dyspnea and awakens usually by around 5am. He sometimes takes 1 hour afternoon naps which slightly improve tiredness. He is taking Zyprexa in the evenings because he does notice this making him more likely to sleep after he takes it. A: Chronic daytime fatigue without any associated constitutional symptoms and with overall good sleep hygiene. This may be contributed by his psychiatric medication but currently he is very well controlled and this fatigue is not activity limiting so I would not challenge his current regimen. P: Check TSH today - normal Follow up for worsening or improvement over next months

## 2017-05-03 NOTE — Assessment & Plan Note (Signed)
Since our last visit he has been monitoring his blood pressure at home and logs in clinic today show numbers consistently in 120s-130s. This is consistent with what he reported last year stating his clinic measurements are elevated compared to home. He continues to exercise at least 3 days per week. A: Hypertension at goal <140/90 Appears to have mildly better BP measurements outside of the clinic setting. P: Continue HCTZ 25mg  daily monotherapy

## 2017-05-03 NOTE — Progress Notes (Signed)
Internal Medicine Clinic Attending  Case discussed with Dr. Rice soon after the resident saw the patient.  We reviewed the resident's history and exam and pertinent patient test results.  I agree with the assessment, diagnosis, and plan of care documented in the resident's note. 

## 2017-05-27 ENCOUNTER — Other Ambulatory Visit: Payer: Self-pay | Admitting: Internal Medicine

## 2017-05-27 DIAGNOSIS — I10 Essential (primary) hypertension: Secondary | ICD-10-CM

## 2017-06-02 ENCOUNTER — Ambulatory Visit: Payer: Medicare Other | Admitting: Podiatry

## 2017-06-20 ENCOUNTER — Ambulatory Visit: Payer: Medicare Other | Admitting: Podiatry

## 2017-11-16 ENCOUNTER — Encounter: Payer: Self-pay | Admitting: *Deleted

## 2017-11-24 ENCOUNTER — Encounter: Payer: Self-pay | Admitting: Internal Medicine

## 2017-11-24 ENCOUNTER — Ambulatory Visit (INDEPENDENT_AMBULATORY_CARE_PROVIDER_SITE_OTHER): Payer: Medicare Other | Admitting: Internal Medicine

## 2017-11-24 ENCOUNTER — Other Ambulatory Visit: Payer: Self-pay

## 2017-11-24 VITALS — BP 154/80 | HR 78 | Temp 98.6°F | Wt 228.0 lb

## 2017-11-24 DIAGNOSIS — F209 Schizophrenia, unspecified: Secondary | ICD-10-CM

## 2017-11-24 DIAGNOSIS — I10 Essential (primary) hypertension: Secondary | ICD-10-CM | POA: Diagnosis not present

## 2017-11-24 DIAGNOSIS — Z8572 Personal history of non-Hodgkin lymphomas: Secondary | ICD-10-CM

## 2017-11-24 DIAGNOSIS — R5382 Chronic fatigue, unspecified: Secondary | ICD-10-CM

## 2017-11-24 DIAGNOSIS — G629 Polyneuropathy, unspecified: Secondary | ICD-10-CM

## 2017-11-24 DIAGNOSIS — Z79899 Other long term (current) drug therapy: Secondary | ICD-10-CM

## 2017-11-24 DIAGNOSIS — Z8619 Personal history of other infectious and parasitic diseases: Secondary | ICD-10-CM

## 2017-11-24 MED ORDER — CHLORTHALIDONE 25 MG PO TABS: 25.0000 mg | ORAL_TABLET | Freq: Every day | ORAL | 3 refills | Status: DC

## 2017-11-24 NOTE — Progress Notes (Signed)
   CC: Annual Follow up  HPI:  [name] is a 57 y.o. African-American gentleman with past medical significant for hypertension-uncontrolled, non-Hodgkin's lymphoma in remission, hepatitis C- s/p for treatment, schizophrenia and chronic fatigue here for an annual visit.   He was last seen at the clinic on 04/29/2017 and was noted to have a mildly better blood pressure measurement and was continued on his hydrochlorothiazide 25 mg daily monotherapy.  During today's visit, he was found to have elevated BPs of 154/80.  Given his uncontrolled blood pressure, HCTZ 25 mg will be discontinued and he will be started on chlorthalidone 25 mg daily.   He has a history of non-Hodgkin's lymphoma which is in remission.  He follows up with oncology at [facility] and has follow-up in September 2019.  He is to have an annual CT scan. He unfortunately has neuropathy and is currently on Gabapentin.   For his schizophrenia, he follows up with [facility] behavioral health therapy and is compliant with follow-up appointments.  He is currently on olanzapine 15 mg, perphenazine 8 mg.   He has a history of chronic fatigue which was present during his last visit in January he does report of some improvement with his fatigue and has followed up with behavioral health.  He currently denies suicidal ideation or homicidal ideation.  He usually goes to the gym to cope with stressors.  Of note his TSH was normal on 04/29/2017.   Past Medical History:  Diagnosis Date  . Cancer ([place])    lymphoma  . Hemorrhoids   . Hepatitis C   . Hypertension   . Non Hodgkin's lymphoma ([place])   . Pulmonary nodule 07/08/2015  . Rectal bleeding   . Schizophrenia ([place])    Review of Systems:   Review of Systems  Constitutional: Negative for fever and weight loss.  Cardiovascular: Negative for chest pain, palpitations and claudication.  Gastrointestinal: Negative for abdominal pain.  Psychiatric/Behavioral: Negative for depression and  suicidal ideas.    Vitals:   11/24/17 1343  BP: (!) 154/80  Pulse: 78  Temp: 98.6 F (37 C)  TempSrc: Oral  SpO2: 97%  Weight: 228 lb (103.4 kg)   Physical Exam: Constitutional: In no distress, appears stated age, normal mood and affect HEENT: Atraumatic, normocephalic Cardiovascular: Normal S1, S2.  No murmurs, gallops, rubs Respiratory: CTA BL Abdomen: BS+, nontender to palpation.  Distended secondary to body habitus Extremity:  Assessment & Plan:   See Encounters Tab for problem based charting.  Patient discussed with Dr. Evette Doffing.

## 2017-11-24 NOTE — Assessment & Plan Note (Signed)
He reports that he monitors BP at home but do not remember readings.   BP elevated today 154/80  Will discontinue HCTZ 25mg  and START Chlorthalidone 25mg .

## 2017-11-24 NOTE — Assessment & Plan Note (Signed)
Still complains of fatigue but improved from last visit in January. Follows up with [facility] health services.   Denies SI or HI

## 2017-11-24 NOTE — Patient Instructions (Addendum)
[name],  It was a pleasure taking care of you today.  Your blood pressure was high today so I am making a couple changes to your medication.  1) STOP hydrochlorothiazide 25 mg 2) START chlorthalidone 25 mg --> Changed to 50mg  daily  Please record blood pressure readings on paper and bring it to your next appointment in 4 weeks.

## 2017-11-24 NOTE — Assessment & Plan Note (Signed)
Complaint with Olanzapine and Perphenazine. Follows up with [facility]

## 2017-11-25 ENCOUNTER — Telehealth: Payer: Self-pay | Admitting: Internal Medicine

## 2017-11-25 ENCOUNTER — Encounter: Payer: Self-pay | Admitting: Internal Medicine

## 2017-11-25 ENCOUNTER — Ambulatory Visit: Payer: Medicare Other | Admitting: Podiatry

## 2017-11-25 LAB — BMP8+ANION GAP
Anion Gap: 20 mmol/L — ABNORMAL HIGH (ref 10.0–18.0)
BUN/Creatinine Ratio: 12 (ref 9–20)
BUN: 13 mg/dL (ref 6–24)
CO2: 23 mmol/L (ref 20–29)
Calcium: 9.7 mg/dL (ref 8.7–10.2)
Chloride: 102 mmol/L (ref 96–106)
Creatinine, Ser: 1.11 mg/dL (ref 0.76–1.27)
GFR calc Af Amer: 84 mL/min/{1.73_m2} (ref >59)
GFR calc non Af Amer: 72 mL/min/{1.73_m2} (ref >59)
Glucose: 99 mg/dL (ref 65–99)
Potassium: 4.2 mmol/L (ref 3.5–5.2)
Sodium: 145 mmol/L — ABNORMAL HIGH (ref 134–144)

## 2017-11-25 MED ORDER — CHLORTHALIDONE 25 MG PO TABS: 50.0000 mg | ORAL_TABLET | Freq: Every day | ORAL | 3 refills | Status: DC

## 2017-11-25 NOTE — Addendum Note (Signed)
Addended by: [name] on: 11/25/2017 09:37 AM   Modules accepted: Orders

## 2017-11-25 NOTE — Progress Notes (Signed)
Internal Medicine Clinic Attending  I saw and evaluated the patient.  I personally confirmed the key portions of the history and exam documented by Dr. Eileen Stanford and I reviewed pertinent patient test results.  The assessment, diagnosis, and plan were formulated together and I agree with the documentation in the resident's note.  As a correction: the patient is here with a chief complaint of hypertension. This is uncontrolled. We are going to change HCTZ to Chlorthalidone 50mg  daily.

## 2017-11-25 NOTE — Telephone Encounter (Signed)
Called the patient and left a voicemail of the adjustments I'm making to his medication. I also reached out to his pharmacy [facility]) and explained the medication changes.   Addendum: Take Chlorthalidone 50mg  daily.   I will try and reach out to patient again.

## 2017-12-01 ENCOUNTER — Ambulatory Visit (INDEPENDENT_AMBULATORY_CARE_PROVIDER_SITE_OTHER): Payer: Medicare Other | Admitting: Podiatry

## 2017-12-01 DIAGNOSIS — M79675 Pain in left toe(s): Secondary | ICD-10-CM | POA: Diagnosis not present

## 2017-12-01 DIAGNOSIS — M79674 Pain in right toe(s): Secondary | ICD-10-CM

## 2017-12-01 DIAGNOSIS — B351 Tinea unguium: Secondary | ICD-10-CM

## 2017-12-06 NOTE — Progress Notes (Signed)
Subjective: 57 y.o. returns the office today for painful, elongated, thickened toenails which he cannot trim himself. Denies any redness or drainage around the nails.He did not get the medication for nail fungus. Denies any acute changes since last appointment and no new complaints today. Denies any systemic complaints such as fevers, chills, nausea, vomiting.   PCP: [name], MD   Objective: AAO 3, NAD DP/PT pulses palpable, CRT less than 3 seconds Nails hypertrophic, dystrophic, elongated, brittle, discolored 10. There is tenderness overlying the nails 1-5 bilaterally. There is no surrounding erythema or drainage along the nail sites. No open lesions or pre-ulcerative lesions are identified. No other areas of tenderness bilateral lower extremities. No overlying edema, erythema, increased warmth. No pain with calf compression, swelling, warmth, erythema.  Assessment: Patient presents with symptomatic onychomycosis  Plan: -Treatment options including alternatives, risks, complications were discussed -Nails sharply debrided 10 without complication/bleeding. -He wants to hold off on the medication for nail fungus and come in for periodic trim once the nails are long and painful.  -Discussed daily foot inspection. If there are any changes, to call the office immediately.  -Follow-up in 3 months or sooner if any problems are to arise. In the meantime, encouraged to call the office with any questions, concerns, changes symptoms.  Celesta Gentile, DPM

## 2017-12-21 ENCOUNTER — Ambulatory Visit (INDEPENDENT_AMBULATORY_CARE_PROVIDER_SITE_OTHER): Payer: Medicare Other | Admitting: Internal Medicine

## 2017-12-21 ENCOUNTER — Other Ambulatory Visit: Payer: Self-pay

## 2017-12-21 VITALS — BP 141/72 | HR 79 | Temp 99.4°F | Ht 65.5 in | Wt 224.6 lb

## 2017-12-21 DIAGNOSIS — I10 Essential (primary) hypertension: Secondary | ICD-10-CM

## 2017-12-21 DIAGNOSIS — R42 Dizziness and giddiness: Secondary | ICD-10-CM

## 2017-12-21 DIAGNOSIS — Z79899 Other long term (current) drug therapy: Secondary | ICD-10-CM | POA: Diagnosis not present

## 2017-12-21 DIAGNOSIS — Z0000 Encounter for general adult medical examination without abnormal findings: Secondary | ICD-10-CM

## 2017-12-21 DIAGNOSIS — Z23 Encounter for immunization: Secondary | ICD-10-CM | POA: Diagnosis not present

## 2017-12-21 HISTORY — DX: Dizziness and giddiness: R42

## 2017-12-21 NOTE — Assessment & Plan Note (Signed)
Patient states that he has occasional dizziness approximately once every 3 weeks/monthly. He denies any falls, blurry vision, sob, palpitations.   Assessment and plan  The patient's is possibly secondary to medication side effect from his olanzapine, gabapentin, perphenazine.  The patient states that it has been several weeks since he has had his last dizziness episode.  -Gave patient's fall precautions -Orthostatic vital signs negative -Recommended him to monitor his episodes of dizziness and inform his PCP at next visit regarding them for further work-up

## 2017-12-21 NOTE — Progress Notes (Signed)
   CC: Blood Pressure Follow up  HPI:  [name] is a 57 y.o. with past medical history documented below presents for blood pressure follow up. Please see problem based charting for evaluation, assessment, and plan.  Past Medical History:  Diagnosis Date  . Cancer ([place])    lymphoma  . Hemorrhoids   . Hepatic cirrhosis ([place]) 11/06/2014  . Hepatitis C   . History of hepatitis C 03/07/2006   Qualifier: Diagnosis of  By: Conception Chancy MD, Hinton Dyer    . Hypertension   . Non Hodgkin's lymphoma ([place])   . Pulmonary nodule 07/08/2015  . Rectal bleeding   . Schizophrenia ([place])    Review of Systems:    Has dizziness, muscle aches Denies sob, chest pain, nausea/vomiting, diarrhea  Physical Exam:  Vitals:   12/21/17 1324  BP: (!) 141/72  Pulse: 79  Temp: 99.4 F (37.4 C)  TempSrc: Oral  SpO2: 98%  Weight: 224 lb 9.6 oz (101.9 kg)  Height: 5' 5.5" (1.664 m)   Physical Exam  Constitutional: He appears well-developed and well-nourished. No distress.  HENT:  Head: Normocephalic and atraumatic.  Eyes: Conjunctivae are normal.  Cardiovascular: Normal rate, regular rhythm and normal heart sounds.  Respiratory: Effort normal and breath sounds normal. No respiratory distress. He has no wheezes.  GI: Soft. Bowel sounds are normal. He exhibits no distension. There is no tenderness.  Musculoskeletal: He exhibits no edema.  Neurological: He is alert.  Skin: He is not diaphoretic. No erythema.  Psychiatric: He has a normal mood and affect. His behavior is normal. Judgment and thought content normal.    Assessment & Plan:   See Encounters Tab for problem based charting.  Patient discussed with Dr. Lynnae January

## 2017-12-21 NOTE — Assessment & Plan Note (Addendum)
The patient's blood pressure during this visit was 141/72. Blood pressure was rechecked a few minutes after the patient was in clinic room and her systolic pressure was in 706C and diastolic pressure was in 37S. His orthostatic blood pressure was negative.   The patient was seen in clinic 2 weeks ago and his blood pressure medicine was changed from hctz to chlorthalidone 50mg  qd.   Assessment and plan  The patient is at blood pressure goal <140/80 per [facility]. He has stable renal function. Will recommend him continue chlorthalidone 50mg  qd.

## 2017-12-21 NOTE — Patient Instructions (Addendum)
It was a pleasure to see you today [name]. Your blood pressure has improved. Please continue taking chlorthalidone and follow up with Dr. Eileen Stanford for your next continuity visit.   If you have any questions or concerns, please call our clinic at [phone] between 9am-5pm and after hours call [phone] and ask for the internal medicine resident on call. If you feel you are having a medical emergency please call 911.   Thank you, we look forward to help you remain healthy!  Scott Mage, MD Internal Medicine PGY2  Fall Prevention in the Home Falls can cause injuries and can affect people from all age groups. There are many simple things that you can do to make your home safe and to help prevent falls. What can I do on the outside of my home?  Regularly repair the edges of walkways and driveways and fix any cracks.  Remove high doorway thresholds.  Trim any shrubbery on the main path into your home.  Use bright outdoor lighting.  Clear walkways of debris and clutter, including tools and rocks.  Regularly check that handrails are securely fastened and in good repair. Both sides of any steps should have handrails.  Install guardrails along the edges of any raised decks or porches.  Have leaves, snow, and ice cleared regularly.  Use sand or salt on walkways during winter months.  In the garage, clean up any spills right away, including grease or oil spills. What can I do in the bathroom?  Use night lights.  Install grab bars by the toilet and in the tub and shower. Do not use towel bars as grab bars.  Use non-skid mats or decals on the floor of the tub or shower.  If you need to sit down while you are in the shower, use a plastic, non-slip stool.  Keep the floor dry. Immediately clean up any water that spills on the floor.  Remove soap buildup in the tub or shower on a regular basis.  Attach bath mats securely with double-sided non-slip rug tape.  Remove throw rugs  and other tripping hazards from the floor. What can I do in the bedroom?  Use night lights.  Make sure that a bedside light is easy to reach.  Do not use oversized bedding that drapes onto the floor.  Have a firm chair that has side arms to use for getting dressed.  Remove throw rugs and other tripping hazards from the floor. What can I do in the kitchen?  Clean up any spills right away.  Avoid walking on wet floors.  Place frequently used items in easy-to-reach places.  If you need to reach for something above you, use a sturdy step stool that has a grab bar.  Keep electrical cables out of the way.  Do not use floor polish or wax that makes floors slippery. If you have to use wax, make sure that it is non-skid floor wax.  Remove throw rugs and other tripping hazards from the floor. What can I do in the stairways?  Do not leave any items on the stairs.  Make sure that there are handrails on both sides of the stairs. Fix handrails that are broken or loose. Make sure that handrails are as long as the stairways.  Check any carpeting to make sure that it is firmly attached to the stairs. Fix any carpet that is loose or worn.  Avoid having throw rugs at the top or bottom of stairways, or secure the rugs with  carpet tape to prevent them from moving.  Make sure that you have a light switch at the top of the stairs and the bottom of the stairs. If you do not have them, have them installed. What are some other fall prevention tips?  Wear closed-toe shoes that fit well and support your feet. Wear shoes that have rubber soles or low heels.  When you use a stepladder, make sure that it is completely opened and that the sides are firmly locked. Have someone hold the ladder while you are using it. Do not climb a closed stepladder.  Add color or contrast paint or tape to grab bars and handrails in your home. Place contrasting color strips on the first and last steps.  Use mobility aids  as needed, such as canes, walkers, scooters, and crutches.  Turn on lights if it is dark. Replace any light bulbs that burn out.  Set up furniture so that there are clear paths. Keep the furniture in the same spot.  Fix any uneven floor surfaces.  Choose a carpet design that does not hide the edge of steps of a stairway.  Be aware of any and all pets.  Review your medicines with your healthcare provider. Some medicines can cause dizziness or changes in blood pressure, which increase your risk of falling. Talk with your health care provider about other ways that you can decrease your risk of falls. This may include working with a physical therapist or trainer to improve your strength, balance, and endurance. This information is not intended to replace advice given to you by your health care provider. Make sure you discuss any questions you have with your health care provider. Document Released: 04/02/2002 Document Revised: 09/09/2015 Document Reviewed: 05/17/2014 Elsevier Interactive Patient Education  [employer].

## 2017-12-22 NOTE — Assessment & Plan Note (Signed)
Influenza vaccine was given during this clinic visit

## 2018-01-03 NOTE — Progress Notes (Signed)
Internal Medicine Clinic Attending  Case discussed with Dr. Chundi at the time of the visit.  We reviewed the resident's history and exam and pertinent patient test results.  I agree with the assessment, diagnosis, and plan of care documented in the resident's note. 

## 2018-01-18 ENCOUNTER — Ambulatory Visit (HOSPITAL_COMMUNITY)
Admission: RE | Admit: 2018-01-18 | Discharge: 2018-01-18 | Disposition: A | Discharge status: Home / Self Care | Payer: Medicare Other | Source: Ambulatory Visit | Attending: Internal Medicine | Admitting: Internal Medicine

## 2018-01-18 ENCOUNTER — Inpatient Hospital Stay: Payer: Medicare Other | Attending: Internal Medicine

## 2018-01-18 DIAGNOSIS — Z9221 Personal history of antineoplastic chemotherapy: Secondary | ICD-10-CM | POA: Insufficient documentation

## 2018-01-18 DIAGNOSIS — C8332 Diffuse large B-cell lymphoma, intrathoracic lymph nodes: Secondary | ICD-10-CM | POA: Diagnosis not present

## 2018-01-18 DIAGNOSIS — K7460 Unspecified cirrhosis of liver: Secondary | ICD-10-CM | POA: Diagnosis not present

## 2018-01-18 DIAGNOSIS — C8330 Diffuse large B-cell lymphoma, unspecified site: Secondary | ICD-10-CM | POA: Diagnosis not present

## 2018-01-18 DIAGNOSIS — C8590 Non-Hodgkin lymphoma, unspecified, unspecified site: Secondary | ICD-10-CM | POA: Diagnosis not present

## 2018-01-18 LAB — CBC WITH DIFFERENTIAL/PLATELET
Basophils Absolute: 0 10*3/uL (ref 0.0–0.1)
Basophils Relative: 0 %
Eosinophils Absolute: 0 10*3/uL (ref 0.0–0.5)
Eosinophils Relative: 1 %
HCT: 41 % (ref 38.4–49.9)
Hemoglobin: 13.2 g/dL (ref 13.0–17.1)
Lymphocytes Relative: 23 %
Lymphs Abs: 1.2 10*3/uL (ref 0.9–3.3)
MCH: 25.5 pg — ABNORMAL LOW (ref 27.2–33.4)
MCHC: 32.2 g/dL (ref 32.0–36.0)
MCV: 79.3 fL (ref 79.3–98.0)
Monocytes Absolute: 0.6 10*3/uL (ref 0.1–0.9)
Monocytes Relative: 12 %
Neutro Abs: 3.5 10*3/uL (ref 1.5–6.5)
Neutrophils Relative %: 64 %
Platelets: 142 10*3/uL (ref 140–400)
RBC: 5.17 MIL/uL (ref 4.20–5.82)
RDW: 13.6 % (ref 11.0–14.6)
WBC: 5.4 10*3/uL (ref 4.0–10.3)

## 2018-01-18 LAB — COMPREHENSIVE METABOLIC PANEL
ALT: 18 U/L (ref 0–44)
AST: 17 U/L (ref 15–41)
Albumin: 4 g/dL (ref 3.5–5.0)
Alkaline Phosphatase: 59 U/L (ref 38–126)
Anion gap: 11 (ref 5–15)
BUN: 12 mg/dL (ref 6–20)
CO2: 30 mmol/L (ref 22–32)
Calcium: 9.9 mg/dL (ref 8.9–10.3)
Chloride: 102 mmol/L (ref 98–111)
Creatinine, Ser: 1.02 mg/dL (ref 0.61–1.24)
GFR calc Af Amer: >60 mL/min (ref >60)
GFR calc non Af Amer: >60 mL/min (ref >60)
Glucose, Bld: 120 mg/dL — ABNORMAL HIGH (ref 70–99)
Potassium: 3.5 mmol/L (ref 3.5–5.1)
Sodium: 143 mmol/L (ref 135–145)
Total Bilirubin: 0.9 mg/dL (ref 0.3–1.2)
Total Protein: 8.2 g/dL — ABNORMAL HIGH (ref 6.5–8.1)

## 2018-01-18 LAB — LACTATE DEHYDROGENASE: LDH: 151 U/L (ref 98–192)

## 2018-01-18 MED ORDER — IOHEXOL 300 MG/ML  SOLN
100.0000 mL | Freq: Once | INTRAMUSCULAR | Status: AC | PRN
  Administered 2018-01-18: 100 mL via INTRAVENOUS

## 2018-01-23 ENCOUNTER — Inpatient Hospital Stay (HOSPITAL_BASED_OUTPATIENT_CLINIC_OR_DEPARTMENT_OTHER): Payer: Medicare Other | Admitting: Internal Medicine

## 2018-01-23 ENCOUNTER — Telehealth: Payer: Self-pay | Admitting: Internal Medicine

## 2018-01-23 ENCOUNTER — Encounter: Payer: Self-pay | Admitting: Internal Medicine

## 2018-01-23 VITALS — BP 133/80 | HR 83 | Temp 98.7°F | Resp 18 | Ht 65.5 in | Wt 222.1 lb

## 2018-01-23 DIAGNOSIS — C8590 Non-Hodgkin lymphoma, unspecified, unspecified site: Secondary | ICD-10-CM | POA: Diagnosis not present

## 2018-01-23 DIAGNOSIS — Z9221 Personal history of antineoplastic chemotherapy: Secondary | ICD-10-CM

## 2018-01-23 DIAGNOSIS — C8332 Diffuse large B-cell lymphoma, intrathoracic lymph nodes: Secondary | ICD-10-CM

## 2018-01-23 NOTE — Telephone Encounter (Signed)
Gave pt avs and calendar  °

## 2018-01-23 NOTE — Progress Notes (Signed)
[facility] [phone]) [phone]   [phone]  OFFICE PROGRESS NOTE  PRINCIPAL DIAGNOSIS: Stage IV non-Hodgkin lymphoma diagnosed in March 2009.   PRIOR THERAPY:  1. Status post 4 weekly doses of Rituxan during the patient's hospitalization at the intensive care unit at [facility] with respiratory failure. 2. Status post 7 cycles of systemic chemotherapy with CHOP/Rituxan. Last dose was given January 15, 2008.  CURRENT THERAPY: Observation.  INTERVAL HISTORY: [name] 57 y.o. male returns to the clinic today for annual follow-up visit accompanied by his sister.  The patient is feeling fine today with no concerning complaints.  He denied having any chest pain, shortness of breath, cough or hemoptysis.  He denied having any weight loss or night sweats.  He has no nausea, vomiting, diarrhea or constipation.  He is currently on observation and feeling well.  He had repeat CT scan of the chest, abdomen and pelvis performed recently and he is here for evaluation and discussion of his discuss results.  MEDICAL HISTORY: Past Medical History:  Diagnosis Date  . Cancer ([place])    lymphoma  . Hemorrhoids   . Hepatic cirrhosis ([place]) 11/06/2014  . Hepatitis C   . History of hepatitis C 03/07/2006   Qualifier: Diagnosis of  By: Conception Chancy MD, Hinton Dyer    . Hypertension   . Non Hodgkin's lymphoma ([place])   . Pulmonary nodule 07/08/2015  . Rectal bleeding   . Schizophrenia ([place])     ALLERGIES:  has No Known Allergies.  MEDICATIONS:  Current Outpatient Medications  Medication Sig Dispense Refill  . chlorthalidone (HYGROTON) 25 MG tablet Take 2 tablets (50 mg total) by mouth daily. 30 tablet 3  . gabapentin (NEURONTIN) 300 MG capsule Take 300 mg by mouth.    . OLANZapine (ZYPREXA) 15 MG tablet 1 15 mg tablet and 1 20 mg tablet    . perphenazine (TRILAFON) 8 MG tablet Take 8 mg by mouth 2 (two) times daily.     No current facility-administered medications for  this visit.     REVIEW OF SYSTEMS:  A comprehensive review of systems was negative.   PHYSICAL EXAMINATION: General appearance: alert, cooperative and no distress Head: Normocephalic, without obvious abnormality, atraumatic Neck: no adenopathy Lymph nodes: Cervical, supraclavicular, and axillary nodes normal. Resp: clear to auscultation bilaterally Back: symmetric, no curvature. ROM normal. No CVA tenderness. Cardio: regular rate and rhythm, S1, S2 normal, no murmur, click, rub or gallop GI: soft, non-tender; bowel sounds normal; no masses,  no organomegaly Extremities: extremities normal, atraumatic, no cyanosis or edema  ECOG PERFORMANCE STATUS: 0 - Asymptomatic  Blood pressure 133/80, pulse 83, temperature 98.7 F (37.1 C), temperature source Oral, resp. rate 18, height 5' 5.5" (1.664 m), weight 222 lb 1.6 oz (100.7 kg), SpO2 99 %.  LABORATORY DATA: Lab Results  Component Value Date   WBC 5.4 01/18/2018   HGB 13.2 01/18/2018   HCT 41.0 01/18/2018   MCV 79.3 01/18/2018   PLT 142 01/18/2018      Chemistry      Component Value Date/Time   NA 143 01/18/2018 0808   NA 145 (H) 11/24/2017 1428   NA 142 01/06/2017 0945   K 3.5 01/18/2018 0808   K 4.5 01/06/2017 0945   CL 102 01/18/2018 0808   CL 107 06/20/2012 1032   CO2 30 01/18/2018 0808   CO2 26 01/06/2017 0945   BUN 12 01/18/2018 0808   BUN 13 11/24/2017 1428  BUN 12.2 01/06/2017 0945   CREATININE 1.02 01/18/2018 0808   CREATININE 1.2 01/06/2017 0945      Component Value Date/Time   CALCIUM 9.9 01/18/2018 0808   CALCIUM 10.0 01/06/2017 0945   ALKPHOS 59 01/18/2018 0808   ALKPHOS 62 01/06/2017 0945   AST 17 01/18/2018 0808   AST 27 01/06/2017 0945   ALT 18 01/18/2018 0808   ALT 28 01/06/2017 0945   BILITOT 0.9 01/18/2018 0808   BILITOT 0.91 01/06/2017 0945       RADIOGRAPHIC STUDIES: Ct Chest W Contrast  Result Date: 01/18/2018 CLINICAL DATA:  Large B-cell lymphoma, diagnosed 2009, status post  chemotherapy. History of hepatitis C. EXAM: CT CHEST, ABDOMEN, AND PELVIS WITH CONTRAST TECHNIQUE: Multidetector CT imaging of the chest, abdomen and pelvis was performed following the standard protocol during bolus administration of intravenous contrast. CONTRAST:  179mL OMNIPAQUE IOHEXOL 300 MG/ML  SOLN COMPARISON:  01/06/2017 FINDINGS: CT CHEST FINDINGS Cardiovascular: The heart is normal in size. No pericardial effusion. No evidence of thoracic aortic aneurysm. Mediastinum/Nodes: No suspicious mediastinal lymphadenopathy. Stable 14 mm left thyroid nodule. Lungs/Pleura: Moderate paraseptal emphysematous changes, upper lobe predominant. No focal consolidation. 2 mm subpleural right lower lobe nodule (series 4/image 80), unchanged, benign. No pleural effusion or pneumothorax. Musculoskeletal: Visualized osseous structures are within normal limits. CT ABDOMEN PELVIS FINDINGS Hepatobiliary: Cirrhosis. 2.3 x 2.0 cm hypoenhancing lesion inferiorly in segment 6 (series 2/image 59), more conspicuous than on prior studies. Gallbladder is unremarkable. No intrahepatic or extrahepatic ductal dilatation. Pancreas: Within normal limits. Spleen: Spleen is at the upper limits of normal in size. Adrenals/Urinary Tract: Adrenal glands within normal limits. Kidneys are within normal limits.  No hydronephrosis. Bladder is within normal limits. Stomach/Bowel: Stomach is within normal limits. No evidence of bowel obstruction. Normal appendix (series 2/image 85). No colonic wall thickening or inflammatory changes. Vascular/Lymphatic: No evidence of abdominal aortic aneurysm. Mild atherosclerotic calcifications the abdominal aorta and branch vessels. Portal vein is patent. Stable 1.9 cm short axis left inguinal node (series 2/image 121). Otherwise, no suspicious abdominopelvic lymphadenopathy. Reproductive: Prostate is unremarkable. Other: No abdominopelvic ascites. Small fat containing periumbilical hernia. Musculoskeletal: Visualized  osseous structures are within normal limits. IMPRESSION: Stable 1.9 cm short axis left inguinal node. Otherwise, no suspicious lymphadenopathy in the chest, abdomen, or pelvis. Spleen is at the upper limits of normal for size. Cirrhosis. 2.3 x 2.0 cm hypoenhancing lesion inferiorly in segment 6, more conspicuous on prior studies. MRI abdomen with/without contrast is suggested for further evaluation. Additional ancillary findings as above. Electronically Signed   By: Julian Hy M.D.   On: 01/18/2018 12:23   Ct Abdomen Pelvis W Contrast  Result Date: 01/18/2018 CLINICAL DATA:  Large B-cell lymphoma, diagnosed 2009, status post chemotherapy. History of hepatitis C. EXAM: CT CHEST, ABDOMEN, AND PELVIS WITH CONTRAST TECHNIQUE: Multidetector CT imaging of the chest, abdomen and pelvis was performed following the standard protocol during bolus administration of intravenous contrast. CONTRAST:  134mL OMNIPAQUE IOHEXOL 300 MG/ML  SOLN COMPARISON:  01/06/2017 FINDINGS: CT CHEST FINDINGS Cardiovascular: The heart is normal in size. No pericardial effusion. No evidence of thoracic aortic aneurysm. Mediastinum/Nodes: No suspicious mediastinal lymphadenopathy. Stable 14 mm left thyroid nodule. Lungs/Pleura: Moderate paraseptal emphysematous changes, upper lobe predominant. No focal consolidation. 2 mm subpleural right lower lobe nodule (series 4/image 80), unchanged, benign. No pleural effusion or pneumothorax. Musculoskeletal: Visualized osseous structures are within normal limits. CT ABDOMEN PELVIS FINDINGS Hepatobiliary: Cirrhosis. 2.3 x 2.0 cm hypoenhancing lesion inferiorly in segment 6 (series  2/image 59), more conspicuous than on prior studies. Gallbladder is unremarkable. No intrahepatic or extrahepatic ductal dilatation. Pancreas: Within normal limits. Spleen: Spleen is at the upper limits of normal in size. Adrenals/Urinary Tract: Adrenal glands within normal limits. Kidneys are within normal limits.  No  hydronephrosis. Bladder is within normal limits. Stomach/Bowel: Stomach is within normal limits. No evidence of bowel obstruction. Normal appendix (series 2/image 85). No colonic wall thickening or inflammatory changes. Vascular/Lymphatic: No evidence of abdominal aortic aneurysm. Mild atherosclerotic calcifications the abdominal aorta and branch vessels. Portal vein is patent. Stable 1.9 cm short axis left inguinal node (series 2/image 121). Otherwise, no suspicious abdominopelvic lymphadenopathy. Reproductive: Prostate is unremarkable. Other: No abdominopelvic ascites. Small fat containing periumbilical hernia. Musculoskeletal: Visualized osseous structures are within normal limits. IMPRESSION: Stable 1.9 cm short axis left inguinal node. Otherwise, no suspicious lymphadenopathy in the chest, abdomen, or pelvis. Spleen is at the upper limits of normal for size. Cirrhosis. 2.3 x 2.0 cm hypoenhancing lesion inferiorly in segment 6, more conspicuous on prior studies. MRI abdomen with/without contrast is suggested for further evaluation. Additional ancillary findings as above. Electronically Signed   By: Julian Hy M.D.   On: 01/18/2018 12:23    ASSESSMENT AND PLAN: This is a very pleasant 57 years old [place] American male with history of stage IV non-Hodgkin lymphoma diagnosed in March of 2009 status post systemic chemotherapy and has been observation since September of 2009. The patient had repeat CT scan of the chest, abdomen and pelvis performed recently.  I personally and independently reviewed the scans and discussed the results with the patient and his sister today.  His scan showed no concerning findings for disease recurrence. I recommended for the patient to continue on observation with repeat CT scan of the chest, abdomen and pelvis in 1 year for reevaluation of his disease.  For the suspicious liver lesion, we will continue to monitor this closely on the upcoming scan. He was advised to call  immediately if he has any concerning symptoms in the interval.  All questions were answered. The patient knows to call the clinic with any problems, questions or concerns. We can certainly see the patient much sooner if necessary.  Disclaimer: This note was dictated with voice recognition software. Similar sounding words can inadvertently be transcribed and may not be corrected upon review.

## 2018-03-03 ENCOUNTER — Ambulatory Visit: Payer: Medicaid Other | Admitting: Podiatry

## 2018-03-06 ENCOUNTER — Ambulatory Visit (INDEPENDENT_AMBULATORY_CARE_PROVIDER_SITE_OTHER): Payer: Medicare Other | Admitting: Podiatry

## 2018-03-06 DIAGNOSIS — M79676 Pain in unspecified toe(s): Secondary | ICD-10-CM

## 2018-03-06 DIAGNOSIS — B351 Tinea unguium: Secondary | ICD-10-CM | POA: Diagnosis not present

## 2018-03-06 DIAGNOSIS — M79674 Pain in right toe(s): Principal | ICD-10-CM

## 2018-03-06 DIAGNOSIS — T451X5A Adverse effect of antineoplastic and immunosuppressive drugs, initial encounter: Secondary | ICD-10-CM

## 2018-03-06 DIAGNOSIS — M79675 Pain in left toe(s): Principal | ICD-10-CM

## 2018-03-06 DIAGNOSIS — G620 Drug-induced polyneuropathy: Secondary | ICD-10-CM

## 2018-03-06 DIAGNOSIS — L84 Corns and callosities: Secondary | ICD-10-CM

## 2018-03-06 NOTE — Patient Instructions (Signed)

## 2018-03-21 ENCOUNTER — Encounter: Payer: Self-pay | Admitting: Podiatry

## 2018-03-21 NOTE — Progress Notes (Signed)
Subjective: [name] presents today with h/o neuropathy secondary to chemotherapy  and cc of painful, discolored, thick toenails which interfere with daily activities and routine tasks.  Pain is aggravated when wearing enclosed shoe gear. Pain is getting progressively worse and relieved with periodic professional debridement.   Patient also presents with cc of hyperkeratotic lesions of both feet which are painful as well.   Objective: Vascular Examination: Capillary refill time <3 seconds x 10 digits Dorsalis pedis pulses and Posterior tibial pulses palpable b/l No digital hair x 10 digits Skin temperature warm to cool b/l  Dermatological Examination: Skin with normal turgor, texture and tone b/l  Toenails 1-5 b/l discolored, thick, dystrophic with subungual debris and pain with palpation to nailbeds due to thickness of nails.  Hyperkeratotic lesions noted dorsal PIPJ b/l 5th digits and interdigitally left lateral hallux and left medial aspect of the 2nd digit  Musculoskeletal: Hammertoe deformity b/l 5th digit Muscle strength 5/5 to all LE muscle groups  Neurological: Sensation diminished with 10 gram monofilament.  Assessment: 1. Painful onychomycosis toenails 1-5 b/l Corns x 4:dorsal PIPJ b/l 5th digits and interdigitally left lateral hallux and left medial aspect of the 2nd digit 2. NIDDM with Peripheral arterial disease 3. Diabetic neuropathy  Plan: 1. Discuss diabetic foot care principles. Literature dispensed. 2. Toenails 1-5 b/l were debrided in length and girth without iatrogenic bleeding. Hyperkeratotic lesion pared with sterile chisel blade and gently smoothed with burr:dorsal PIPJ b/l 5th digits and interdigitally left lateral hallux and left medial aspect of the 2nd digit. Dispensed Silcone digital pads for daily protection. He is to apply to digits in the morning and remove every evening. 3. Patient to continue soft, supportive shoe gear 4. Patient to report any  pedal injuries to medical professional  5. Follow up 3 months. Patient/POA to call should there be a concern in the interim.

## 2018-03-28 ENCOUNTER — Other Ambulatory Visit: Payer: Self-pay | Admitting: Internal Medicine

## 2018-06-05 ENCOUNTER — Ambulatory Visit (INDEPENDENT_AMBULATORY_CARE_PROVIDER_SITE_OTHER): Payer: Medicare Other | Admitting: Podiatry

## 2018-06-05 ENCOUNTER — Encounter: Payer: Self-pay | Admitting: Podiatry

## 2018-06-05 DIAGNOSIS — M79674 Pain in right toe(s): Secondary | ICD-10-CM

## 2018-06-05 DIAGNOSIS — L84 Corns and callosities: Secondary | ICD-10-CM

## 2018-06-05 DIAGNOSIS — T451X5A Adverse effect of antineoplastic and immunosuppressive drugs, initial encounter: Secondary | ICD-10-CM

## 2018-06-05 DIAGNOSIS — M79675 Pain in left toe(s): Secondary | ICD-10-CM

## 2018-06-05 DIAGNOSIS — B351 Tinea unguium: Secondary | ICD-10-CM | POA: Diagnosis not present

## 2018-06-05 DIAGNOSIS — G620 Drug-induced polyneuropathy: Secondary | ICD-10-CM

## 2018-06-05 NOTE — Patient Instructions (Signed)

## 2018-06-05 NOTE — Progress Notes (Signed)
Subjective: [name] presents with chemotherapy induced neuropathy and cc of painful, discolored, thick toenails and painful callus/corn which interfere with activities of daily living. Pain is aggravated when wearing enclosed shoe gear. Pain is relieved with periodic professional debridement.  [name], MD is his PCP and last visit was  11/24/2017.  [name] voices no new problems on today's visit.  He relates the silicone toe pads given to him on last visit caused his toes to peel, so he discontinued using them.   Current Outpatient Medications:  .  chlorthalidone (HYGROTON) 25 MG tablet, Take 2 tablets (50 mg total) by mouth daily., Disp: 30 tablet, Rfl: 3 .  chlorthalidone (HYGROTON) 50 MG tablet, TAKE 1 TABLET ONCE DAILY., Disp: 90 tablet, Rfl: 0 .  gabapentin (NEURONTIN) 300 MG capsule, Take 300 mg by mouth., Disp: , Rfl:  .  OLANZapine (ZYPREXA) 15 MG tablet, 1 15 mg tablet and 1 20 mg tablet, Disp: , Rfl:  .  perphenazine (TRILAFON) 8 MG tablet, Take 8 mg by mouth 2 (two) times daily., Disp: , Rfl:   No Known Allergies  Vascular Examination: Capillary refill time <3 seconds x 10 digits Dorsalis pedis and Posterior tibial pulses present b/l No digital hair x 10 digits Skin temperature warm to cool b/l  Dermatological Examination: Skin with normal turgor, texture and tone b/l  Toenails 1-5 b/l discolored, thick, dystrophic with subungual debris and pain with palpation to nailbeds due to thickness of nails.  Hyperkeratotic lesion dorsal aspect 5th digit DIPJ/PIPJ b/l with tenderness to palpation. No erythema, no edema, no drainage, no flocculence.  Musculoskeletal: Muscle strength 5/5 to all LE muscle groups  Adductovarus hammertoe deformity b/l 5th digits  Neurological: Sensation diminished with 10 gram monofilament.  Assessment: 1. Painful onychomycosis toenails 1-5 b/l 2. Corn 5th digit DIPJ/PIPJ b/l  3. Chemotherapy induced neuropathy  Plan: 1. Toenails  1-5 b/l were debrided in length and girth without iatrogenic bleeding. 2. Hyperkeratotic lesion pared with sterile scalpel blade b/l 5th DIPJ/PIPJ without incident. Dispensed tube foam pads for protection. 3. Patient to continue soft, supportive shoe gear 4. Patient to report any pedal injuries to medical professional  5. Follow up 3 months.  6. Patient/POA to call should there be a concern in the interim.

## 2018-06-24 ENCOUNTER — Other Ambulatory Visit: Payer: Self-pay | Admitting: Internal Medicine

## 2018-06-26 NOTE — Telephone Encounter (Signed)
Rx last filled 03/31/18 #90 with the following message PT NEEDS TO BE SEEN AT [facility].  Next appt with pcp on 07/24/18.  Marland KitchenPatient's pcp unavailable-refill request sent to [facility] attending for review.  Please advise.Despina Hidden Cassady3/2/202010:45 AM

## 2018-07-05 DIAGNOSIS — H259 Unspecified age-related cataract: Secondary | ICD-10-CM | POA: Diagnosis not present

## 2018-07-05 DIAGNOSIS — H524 Presbyopia: Secondary | ICD-10-CM | POA: Diagnosis not present

## 2018-07-05 DIAGNOSIS — H538 Other visual disturbances: Secondary | ICD-10-CM | POA: Diagnosis not present

## 2018-07-11 DIAGNOSIS — H5213 Myopia, bilateral: Secondary | ICD-10-CM | POA: Diagnosis not present

## 2018-07-24 ENCOUNTER — Other Ambulatory Visit: Payer: Self-pay

## 2018-07-24 ENCOUNTER — Encounter: Payer: Self-pay | Admitting: Internal Medicine

## 2018-07-24 ENCOUNTER — Other Ambulatory Visit: Payer: Self-pay | Admitting: Internal Medicine

## 2018-07-24 ENCOUNTER — Ambulatory Visit (INDEPENDENT_AMBULATORY_CARE_PROVIDER_SITE_OTHER): Payer: Medicare Other | Admitting: Internal Medicine

## 2018-07-24 ENCOUNTER — Other Ambulatory Visit: Payer: Self-pay | Admitting: *Deleted

## 2018-07-24 DIAGNOSIS — C8332 Diffuse large B-cell lymphoma, intrathoracic lymph nodes: Secondary | ICD-10-CM

## 2018-07-24 DIAGNOSIS — I10 Essential (primary) hypertension: Secondary | ICD-10-CM

## 2018-07-24 MED ORDER — GABAPENTIN 300 MG PO CAPS: 300.0000 mg | ORAL_CAPSULE | Freq: Three times a day (TID) | ORAL | 3 refills | Status: AC

## 2018-07-24 MED ORDER — CHLORTHALIDONE 50 MG PO TABS: 50.0000 mg | ORAL_TABLET | Freq: Every day | ORAL | 3 refills | Status: DC

## 2018-07-24 NOTE — Progress Notes (Signed)
Medicine attending: Medical history, presenting problems,  and medications, reviewed with resident physician Dr Nathanial Rancher on the day of the patient telephone consulatation and I concur with his evaluation and management plan.

## 2018-07-24 NOTE — Assessment & Plan Note (Signed)
Diffuse large B-cell lymphoma: [name] was diagnosed with stage IV non-Hodgkin's lymphoma in March 2009 and has had regular follow-up with oncology (Dr. Earlie Server).  He is status post systemic chemotherapy.  Last follow-up with oncology was September 30 of 2019 and was advised to continue observation and have repeat CT scan of chest abdomen and pelvis in 1 year.  Noted to have a suspicious liver lesion and was advised to have close follow-up.  Plan: -Continue follow-up with oncology.

## 2018-07-24 NOTE — Progress Notes (Signed)
   CC: Follow up Blood pressure/TeleHealth   HPI:  [name] is a 58 y.o. with medical history listed below home I called to follow-up via telehealth.  Please see problem based charting for further details  Past Medical History:  Diagnosis Date  . Cancer ([place])    lymphoma  . Hemorrhoids   . Hepatic cirrhosis ([place]) 11/06/2014  . Hepatitis C   . History of hepatitis C 03/07/2006   Qualifier: Diagnosis of  By: Conception Chancy MD, Hinton Dyer    . Hypertension   . Non Hodgkin's lymphoma ([place])   . Pulmonary nodule 07/08/2015  . Rectal bleeding   . Schizophrenia ([place])    Review of Systems:  Unremarkable.   Assessment & Plan:   See Encounters Tab for problem based charting.  Patient discussed with Dr. Beryle Beams    This is a telephone encounter between [name] and [name] on 07/24/2018 for Continuity visit/Follow up. The visit was conducted with the patient located at home and [name] at [facility]. The patient's identity was confirmed using their DOB and current address. The patient has consented to being evaluated through a telephone encounter and understands the associated risks (an examination cannot be done and the patient may need to come in for an appointment) / benefits (allows the patient to remain at home, decreasing exposure to coronavirus). I personally spent 3 minutes on medical discussion.

## 2018-07-24 NOTE — Assessment & Plan Note (Signed)
Hypertension: [name] was last evaluated at the clinic on March 31, 2018 and was noted to have an elevated BP of 141/72.  At that visit, he was taking hydrochlorothiazide which had been switched to chlorthalidone 50 mg daily.  Per my telephone encounter with him, he tells me he is doing pretty well and denies any cardiopulmonary symptoms.  He reports that he intermittently monitors his blood pressure and is usually with systolic blood pressure in the 130s.  Plan: -Refill chlorthalidone 50 mg daily -Will check BMP, since diuretic was recently increased in December.

## 2018-09-04 ENCOUNTER — Ambulatory Visit: Payer: Medicaid Other | Admitting: Podiatry

## 2018-09-06 ENCOUNTER — Other Ambulatory Visit: Payer: Self-pay

## 2018-09-06 ENCOUNTER — Ambulatory Visit (INDEPENDENT_AMBULATORY_CARE_PROVIDER_SITE_OTHER): Payer: Medicare Other | Admitting: Podiatry

## 2018-09-06 VITALS — Temp 97.5°F

## 2018-09-06 DIAGNOSIS — M79674 Pain in right toe(s): Secondary | ICD-10-CM | POA: Diagnosis not present

## 2018-09-06 DIAGNOSIS — T451X5A Adverse effect of antineoplastic and immunosuppressive drugs, initial encounter: Secondary | ICD-10-CM | POA: Diagnosis not present

## 2018-09-06 DIAGNOSIS — B351 Tinea unguium: Secondary | ICD-10-CM

## 2018-09-06 DIAGNOSIS — M79675 Pain in left toe(s): Secondary | ICD-10-CM

## 2018-09-06 DIAGNOSIS — G620 Drug-induced polyneuropathy: Secondary | ICD-10-CM

## 2018-09-06 DIAGNOSIS — L84 Corns and callosities: Secondary | ICD-10-CM

## 2018-09-06 NOTE — Patient Instructions (Signed)

## 2018-09-14 ENCOUNTER — Encounter: Payer: Self-pay | Admitting: Podiatry

## 2018-09-14 NOTE — Progress Notes (Signed)
Subjective: [name] presents to clinic for preventative foot care.  He has history of chemotherapy-induced neuropathy.  He has chief complaint of elongated, painful mycotic toenails and corns bilateral fifth digits. Pain symptoms resolve with periodic professional debridement.  [name], MD is his PCP.   Current Outpatient Medications:  .  benztropine (COGENTIN) 0.5 MG tablet, , Disp: , Rfl:  .  chlorthalidone (HYGROTON) 50 MG tablet, Take 1 tablet (50 mg total) by mouth daily., Disp: 30 tablet, Rfl: 3 .  gabapentin (NEURONTIN) 300 MG capsule, Take 1 capsule (300 mg total) by mouth 3 (three) times daily., Disp: 90 capsule, Rfl: 3 .  OLANZapine (ZYPREXA) 15 MG tablet, 1 15 mg tablet and 1 20 mg tablet, Disp: , Rfl:  .  perphenazine (TRILAFON) 8 MG tablet, Take 8 mg by mouth 2 (two) times daily., Disp: , Rfl:    No Known Allergies   Objective: Vitals:   09/06/18 1346  Temp: (!) 97.5 F (36.4 C)    Physical Examination:  Vascular  Examination: Capillary refill time less than 3 seconds x 10 digits.  Palpable DP/PT pulses b/l.  Digital hair absent bilaterally.  No edema noted b/l.  Skin temperature gradient warm to cool bilaterally  Dermatological Examination: Skin with normal turgor, texture and tone b/l.  No open wounds b/l.  No interdigital macerations noted b/l.  Elongated, thick, discolored brittle toenails with subungual debris and pain on dorsal palpation of nailbeds 1-5 b/l.  Hyperkeratotic dorsal fifth digit DIPJ/PIPJ bilaterally with tenderness to palpation.  There is no erythema, no edema, no drainage, no flocculence noted.    Musculoskeletal Examination: Muscle strength 5/5 to all muscle groups b/l.  No pain, crepitus or joint discomfort with active/passive ROM.  Neurological Examination: Sensation diminished b/l with 10 gram monofilament.  Assessment: 1. Mycotic nail infection with pain 1-5 b/l 2. Corn bilateral fifth digits  DIPJ/PIPJ 3. Chemotherapy-induced neuropathy  Plan: 1. Toenails 1-5 b/l were debrided in length and girth without iatrogenic laceration. 2. Corns pared bilateral fifth digits DIPJ/PIPJ; total of 4 lesions. 3. Continue soft, supportive shoe gear daily. 4. Report any pedal injuries to medical professional. 5. Follow up 3 months. 6. Patient/POA to call should there be a question/concern in there interim.

## 2018-11-20 ENCOUNTER — Encounter: Payer: Self-pay | Admitting: Gastroenterology

## 2018-11-27 ENCOUNTER — Other Ambulatory Visit: Payer: Self-pay | Admitting: Internal Medicine

## 2018-12-05 ENCOUNTER — Ambulatory Visit: Payer: Medicare Other | Admitting: Podiatry

## 2018-12-20 ENCOUNTER — Ambulatory Visit (INDEPENDENT_AMBULATORY_CARE_PROVIDER_SITE_OTHER): Payer: Medicare Other | Admitting: Podiatry

## 2018-12-20 ENCOUNTER — Other Ambulatory Visit: Payer: Self-pay

## 2018-12-20 DIAGNOSIS — M79674 Pain in right toe(s): Secondary | ICD-10-CM | POA: Diagnosis not present

## 2018-12-20 DIAGNOSIS — M79675 Pain in left toe(s): Secondary | ICD-10-CM | POA: Diagnosis not present

## 2018-12-20 DIAGNOSIS — B351 Tinea unguium: Secondary | ICD-10-CM | POA: Diagnosis not present

## 2018-12-20 DIAGNOSIS — T451X5A Adverse effect of antineoplastic and immunosuppressive drugs, initial encounter: Secondary | ICD-10-CM | POA: Diagnosis not present

## 2018-12-20 DIAGNOSIS — L84 Corns and callosities: Secondary | ICD-10-CM | POA: Diagnosis not present

## 2018-12-20 DIAGNOSIS — G620 Drug-induced polyneuropathy: Secondary | ICD-10-CM

## 2018-12-20 NOTE — Patient Instructions (Signed)
Corns and Calluses Corns are small areas of thickened skin that occur on the top, sides, or tip of a toe. They contain a cone-shaped core with a point that can press on a nerve below. This causes pain.  Calluses are areas of thickened skin that can occur anywhere on the body, including the hands, fingers, palms, soles of the feet, and heels. Calluses are usually larger than corns. What are the causes? Corns and calluses are caused by rubbing (friction) or pressure, such as from shoes that are too tight or do not fit properly. What increases the risk? Corns are more likely to develop in people who have misshapen toes (toe deformities), such as hammer toes. Calluses can occur with friction to any area of the skin. They are more likely to develop in people who:  Work with their hands.  Wear shoes that fit poorly, are too tight, or are high-heeled.  Have toe deformities. What are the signs or symptoms? Symptoms of a corn or callus include:  A hard growth on the skin.  Pain or tenderness under the skin.  Redness and swelling.  Increased discomfort while wearing tight-fitting shoes, if your feet are affected. If a corn or callus becomes infected, symptoms may include:  Redness and swelling that gets worse.  Pain.  Fluid, blood, or pus draining from the corn or callus. How is this diagnosed? Corns and calluses may be diagnosed based on your symptoms, your medical history, and a physical exam. How is this treated? Treatment for corns and calluses may include:  Removing the cause of the friction or pressure. This may involve: ? Changing your shoes. ? Wearing shoe inserts (orthotics) or other protective layers in your shoes, such as a corn pad. ? Wearing gloves.  Applying medicine to the skin (topical medicine) to help soften skin in the hardened, thickened areas.  Removing layers of dead skin with a file to reduce the size of the corn or callus.  Removing the corn or callus with a  scalpel or laser.  Taking antibiotic medicines, if your corn or callus is infected.  Having surgery, if a toe deformity is the cause. Follow these instructions at home:   Take over-the-counter and prescription medicines only as told by your health care provider.  If you were prescribed an antibiotic, take it as told by your health care provider. Do not stop taking it even if your condition starts to improve.  Wear shoes that fit well. Avoid wearing high-heeled shoes and shoes that are too tight or too loose.  Wear any padding, protective layers, gloves, or orthotics as told by your health care provider.  Soak your hands or feet and then use a file or pumice stone to soften your corn or callus. Do this as told by your health care provider.  Check your corn or callus every day for symptoms of infection. Contact a health care provider if you:  Notice that your symptoms do not improve with treatment.  Have redness or swelling that gets worse.  Notice that your corn or callus becomes painful.  Have fluid, blood, or pus coming from your corn or callus.  Have new symptoms. Summary  Corns are small areas of thickened skin that occur on the top, sides, or tip of a toe.  Calluses are areas of thickened skin that can occur anywhere on the body, including the hands, fingers, palms, and soles of the feet. Calluses are usually larger than corns.  Corns and calluses are caused by   rubbing (friction) or pressure, such as from shoes that are too tight or do not fit properly.  Treatment may include wearing any padding, protective layers, gloves, or orthotics as told by your health care provider. This information is not intended to replace advice given to you by your health care provider. Make sure you discuss any questions you have with your health care provider. Document Released: 01/17/2004 Document Revised: 08/02/2018 Document Reviewed: 02/23/2017 Elsevier Patient Education  2020 [employer]   Onychomycosis/Fungal Toenails  WHAT IS IT? An infection that lies within the keratin of your nail plate that is caused by a fungus.  WHY ME? Fungal infections affect all ages, sexes, races, and creeds.  There may be many factors that predispose you to a fungal infection such as age, coexisting medical conditions such as diabetes, or an autoimmune disease; stress, medications, fatigue, genetics, etc.  Bottom line: fungus thrives in a warm, moist environment and your shoes offer such a location.  IS IT CONTAGIOUS? Theoretically, yes.  You do not want to share shoes, nail clippers or files with someone who has fungal toenails.  Walking around barefoot in the same room or sleeping in the same bed is unlikely to transfer the organism.  It is important to realize, however, that fungus can spread easily from one nail to the next on the same foot.  HOW DO WE TREAT THIS?  There are several ways to treat this condition.  Treatment may depend on many factors such as age, medications, pregnancy, liver and kidney conditions, etc.  It is best to ask your doctor which options are available to you.  1. No treatment.   Unlike many other medical concerns, you can live with this condition.  However for many people this can be a painful condition and may lead to ingrown toenails or a bacterial infection.  It is recommended that you keep the nails cut short to help reduce the amount of fungal nail. 2. Topical treatment.  These range from herbal remedies to prescription strength nail lacquers.  About 40-50% effective, topicals require twice daily application for approximately 9 to 12 months or until an entirely new nail has grown out.  The most effective topicals are medical grade medications available through physicians offices. 3. Oral antifungal medications.  With an 80-90% cure rate, the most common oral medication requires 3 to 4 months of therapy and stays in your system for a year as the new nail grows out.   Oral antifungal medications do require blood work to make sure it is a safe drug for you.  A liver function panel will be performed prior to starting the medication and after the first month of treatment.  It is important to have the blood work performed to avoid any harmful side effects.  In general, this medication safe but blood work is required. 4. Laser Therapy.  This treatment is performed by applying a specialized laser to the affected nail plate.  This therapy is noninvasive, fast, and non-painful.  It is not covered by insurance and is therefore, out of pocket.  The results have been very good with a 80-95% cure rate.  The [facility] is the only practice in the area to offer this therapy. 5. Permanent Nail Avulsion.  Removing the entire nail so that a new nail will not grow back. 

## 2018-12-26 ENCOUNTER — Other Ambulatory Visit: Payer: Self-pay | Admitting: Internal Medicine

## 2018-12-27 ENCOUNTER — Ambulatory Visit (AMBULATORY_SURGERY_CENTER): Payer: Self-pay | Admitting: *Deleted

## 2018-12-27 ENCOUNTER — Other Ambulatory Visit: Payer: Self-pay

## 2018-12-27 VITALS — Temp 96.9°F | Ht 65.5 in | Wt 219.0 lb

## 2018-12-27 DIAGNOSIS — Z86010 Personal history of colonic polyps: Secondary | ICD-10-CM

## 2018-12-27 MED ORDER — PEG 3350-KCL-NA BICARB-NACL 420 G PO SOLR: 4000.0000 mL | Freq: Once | ORAL | 0 refills | Status: AC

## 2018-12-27 NOTE — Progress Notes (Signed)
No egg or soy allergy known to patient  No issues with past sedation with any surgeries  or procedures, no intubation problems  No diet pills per patient No home 02 use per patient  No blood thinners per patient  Pt denies issues with constipation  No A fib or A flutter   

## 2018-12-28 ENCOUNTER — Encounter: Payer: Self-pay | Admitting: Podiatry

## 2018-12-28 NOTE — Progress Notes (Signed)
Subjective: [name] presents today with history of chemotherapy induced neuropathy. Patient seen for follow up of chronic, painful mycotic toenails and corns b/l 5th digits which interfere with daily activities and routine tasks.  Pain is aggravated when wearing enclosed shoe gear. Pain is getting progressively worse and relieved with periodic professional debridement.   [name], MD is his PCP.   He voices no new pedal concerns on today's visit.   Current Outpatient Medications:  .  benztropine (COGENTIN) 0.5 MG tablet, , Disp: , Rfl:  .  chlorthalidone (HYGROTON) 50 MG tablet, TAKE 1 TABLET ONCE DAILY., Disp: 90 tablet, Rfl: 0 .  gabapentin (NEURONTIN) 300 MG capsule, Take 1 capsule (300 mg total) by mouth 3 (three) times daily., Disp: 90 capsule, Rfl: 3 .  OLANZapine (ZYPREXA) 20 MG tablet, , Disp: , Rfl:  .  perphenazine (TRILAFON) 8 MG tablet, Take 8 mg by mouth 2 (two) times daily., Disp: , Rfl:   No Known Allergies  Objective:  Vascular Examination: Capillary refill time less thank 3 seconds x 10 digits.  Dorsalis pedis pulses present b/l.  Posterior tibial pulses present b/l.  No digital hair x 10 digits.  Skin temperature warm to cool b/l.  Dermatological Examination: Skin with normal turgor, texture and tone b/l.  Toenails 1-5 b/l discolored, thick, dystrophic with subungual debris and pain with palpation to nailbeds due to thickness of nails.  Hyperkeratotic lesions b/l 5th DIPJ/PIPJ. No erythema, no edema, no drainage, no flocculence noted.   Musculoskeletal: Muscle strength 5/5 to all LE muscle groups.  Adductovarus 5th digits b/l.  Neurological: Sensation diminished with 10 gram monofilament.  Assessment: 1. Painful onychomycosis toenails 1-5 b/l 2. Chemotherapy induced neuropathy   Plan: 1. Toenails 1-5 b/l were debrided in length and girth without iatrogenic bleeding. 2. Corn(s) b/l 5th digits DIPJ/PIPJ pared utilizing sterile scalpel blade  without incident.  3. Patient to continue soft, supportive shoe gear daily. 4. Patient to report any pedal injuries to medical professional immediately. 5. Follow up 3 months.  6. Patient/POA to call should there be a concern in the interim.

## 2019-01-03 ENCOUNTER — Encounter: Payer: Medicare Other | Admitting: Internal Medicine

## 2019-01-09 ENCOUNTER — Telehealth: Payer: Self-pay

## 2019-01-09 NOTE — Telephone Encounter (Signed)
Covid-19 screening questions   Do you now or have you had a fever in the last 14 days? NO   Do you have any respiratory symptoms of shortness of breath or cough now or in the last 14 days? NO  Do you have any family members or close contacts with diagnosed or suspected Covid-19 in the past 14 days? NO  Have you been tested for Covid-19 and found to be positive? NO        

## 2019-01-10 ENCOUNTER — Ambulatory Visit (AMBULATORY_SURGERY_CENTER): Payer: Medicare Other | Admitting: Gastroenterology

## 2019-01-10 ENCOUNTER — Other Ambulatory Visit: Payer: Self-pay | Admitting: Gastroenterology

## 2019-01-10 ENCOUNTER — Encounter: Payer: Self-pay | Admitting: Gastroenterology

## 2019-01-10 ENCOUNTER — Other Ambulatory Visit: Payer: Self-pay

## 2019-01-10 VITALS — BP 114/68 | HR 64 | Temp 98.8°F | Resp 15 | Ht 65.5 in | Wt 222.0 lb

## 2019-01-10 DIAGNOSIS — D124 Benign neoplasm of descending colon: Secondary | ICD-10-CM

## 2019-01-10 DIAGNOSIS — Z86010 Personal history of colonic polyps: Secondary | ICD-10-CM | POA: Diagnosis not present

## 2019-01-10 MED ORDER — SODIUM CHLORIDE 0.9 % IV SOLN: 500.0000 mL | Freq: Once | INTRAVENOUS | Status: DC

## 2019-01-10 NOTE — Patient Instructions (Signed)
Thank you for allowing us to care for you today!  Await pathology results by mail, approximately 2 weeks.  Recommendation for next colonoscopy will be made at that time.  Resume previous diet and medications today.  Return to your normal activities tomorrow.      YOU HAD AN ENDOSCOPIC PROCEDURE TODAY AT THE [facility]:   Refer to the procedure report that was given to you for any specific questions about what was found during the examination.  If the procedure report does not answer your questions, please call your gastroenterologist to clarify.  If you requested that your care partner not be given the details of your procedure findings, then the procedure report has been included in a sealed envelope for you to review at your convenience later.  YOU SHOULD EXPECT: Some feelings of bloating in the abdomen. Passage of more gas than usual.  Walking can help get rid of the air that was put into your GI tract during the procedure and reduce the bloating. If you had a lower endoscopy (such as a colonoscopy or flexible sigmoidoscopy) you may notice spotting of blood in your stool or on the toilet paper. If you underwent a bowel prep for your procedure, you may not have a normal bowel movement for a few days.  Please Note:  You might notice some irritation and congestion in your nose or some drainage.  This is from the oxygen used during your procedure.  There is no need for concern and it should clear up in a day or so.  SYMPTOMS TO REPORT IMMEDIATELY:   Following lower endoscopy (colonoscopy or flexible sigmoidoscopy):  Excessive amounts of blood in the stool  Significant tenderness or worsening of abdominal pains  Swelling of the abdomen that is new, acute  Fever of 100F or higher    For urgent or emergent issues, a gastroenterologist can be reached at any hour by calling [phone].   DIET:  We do recommend a small meal at first, but then you may proceed to your regular  diet.  Drink plenty of fluids but you should avoid alcoholic beverages for 24 hours.  ACTIVITY:  You should plan to take it easy for the rest of today and you should NOT DRIVE or use heavy machinery until tomorrow (because of the sedation medicines used during the test).    FOLLOW UP: Our staff will call the number listed on your records 48-72 hours following your procedure to check on you and address any questions or concerns that you may have regarding the information given to you following your procedure. If we do not reach you, we will leave a message.  We will attempt to reach you two times.  During this call, we will ask if you have developed any symptoms of COVID 19. If you develop any symptoms (ie: fever, flu-like symptoms, shortness of breath, cough etc.) before then, please call [phone].  If you test positive for Covid 19 in the 2 weeks post procedure, please call and report this information to us.    If any biopsies were taken you will be contacted by phone or by letter within the next 1-3 weeks.  Please call us at [phone] if you have not heard about the biopsies in 3 weeks.    SIGNATURES/CONFIDENTIALITY: You and/or your care partner have signed paperwork which will be entered into your electronic medical record.  These signatures attest to the fact that that the information above on your After Visit   your After Visit Summary has been reviewed and is understood.  Full responsibility of the confidentiality of this discharge information lies with you and/or your care-partner. 

## 2019-01-10 NOTE — Progress Notes (Signed)
PT taken to PACU. Monitors in place. VSS. Report given to RN. 

## 2019-01-10 NOTE — Progress Notes (Signed)
Pt's states no medical or surgical changes since previsit or office visit.  KA - temps [place] - vitals

## 2019-01-10 NOTE — Op Note (Signed)
[facility] Patient Name: [name] Procedure Date: 01/10/2019 1:51 PM MRN: [id number] Endoscopist: Milus Banister , MD Age: 58 Referring MD:  Date of Birth: [birth year] Gender: Male Account #: [id number] Procedure:                Colonoscopy Indications:              High risk colon cancer surveillance: Personal                            history of colonic polyps; colonoscopy 2017 52mm TA                            from transverse, 80mm TVA in descending (site                            labeled with spot injection) Medicines:                Monitored Anesthesia Care Procedure:                Pre-Anesthesia Assessment:                           - Prior to the procedure, a History and Physical                            was performed, and patient medications and                            allergies were reviewed. The patient's tolerance of                            previous anesthesia was also reviewed. The risks                            and benefits of the procedure and the sedation                            options and risks were discussed with the patient.                            All questions were answered, and informed consent                            was obtained. Prior Anticoagulants: The patient has                            taken no previous anticoagulant or antiplatelet                            agents. ASA Grade Assessment: II - A patient with                            mild systemic disease. After reviewing the risks  and benefits, the patient was deemed in                            satisfactory condition to undergo the procedure.                           After obtaining informed consent, the colonoscope                            was passed under direct vision. Throughout the                            procedure, the patient's blood pressure, pulse, and                            oxygen saturations were monitored  continuously. The                            Colonoscope was introduced through the anus and                            advanced to the the cecum, identified by                            appendiceal orifice and ileocecal valve. The                            colonoscopy was performed without difficulty. The                            patient tolerated the procedure well. The quality                            of the bowel preparation was excellent. Scope In: 2:05:03 PM Scope Out: 2:15:33 PM Scope Withdrawal Time: 0 hours 7 minutes 40 seconds  Total Procedure Duration: 0 hours 10 minutes 30 seconds  Findings:                 A 5 mm polyp was found in the descending colon at                            the site of the 2017 polypectomy (adjacent to the                            previous Spot tattoo). The polyp was sessile. The                            polyp was removed with a cold snare. Resection and                            retrieval were complete.                           The exam was otherwise without abnormality  on                            direct and retroflexion views. Complications:            No immediate complications. Estimated blood loss:                            None. Estimated Blood Loss:     Estimated blood loss: none. Impression:               - One 5 mm polyp in the descending colon, removed                            with a cold snare. Resected and retrieved. This was                            at the same site as 2017 polypectomy in the                            descending colon.                           - The examination was otherwise normal on direct                            and retroflexion views. Recommendation:           - Patient has a contact number available for                            emergencies. The signs and symptoms of potential                            delayed complications were discussed with the                            patient. Return  to normal activities tomorrow.                            Written discharge instructions were provided to the                            patient.                           - Resume previous diet.                           - Continue present medications.                           - Await pathology results. Milus Banister, MD 01/10/2019 2:19:59 PM This report has been signed electronically.

## 2019-01-10 NOTE — Progress Notes (Signed)
Called to room to assist during endoscopic procedure.  Patient ID and intended procedure confirmed with present staff. Received instructions for my participation in the procedure from the performing physician.  

## 2019-01-12 ENCOUNTER — Telehealth: Payer: Self-pay | Admitting: *Deleted

## 2019-01-12 NOTE — Telephone Encounter (Signed)
  Follow up Call-  Call back number 01/10/2019  Post procedure Call Back phone  # [phone]  Permission to leave phone message Yes  Some recent data might be hidden     Patient questions:  Do you have a fever, pain , or abdominal swelling? No. Pain Score  0 *  Have you tolerated food without any problems? Yes.    Have you been able to return to your normal activities? Yes.    Do you have any questions about your discharge instructions: Diet   No. Medications  No. Follow up visit  No.  Do you have questions or concerns about your Care? No.  Actions: * If pain score is 4 or above: No action needed, pain <4.  1. Have you developed a fever since your procedure? no  2.   Have you had an respiratory symptoms (SOB or cough) since your procedure? no  3.   Have you tested positive for COVID 19 since your procedure no  4.   Have you had any family members/close contacts diagnosed with the COVID 19 since your procedure?  no   If yes to any of these questions please route to Joylene John, RN and Alphonsa Gin, Therapist, sports.

## 2019-01-12 NOTE — Telephone Encounter (Signed)
First follow up call attempt.  Message left to call if any questions or concerns regarding procedure.

## 2019-01-17 ENCOUNTER — Encounter: Payer: Self-pay | Admitting: Gastroenterology

## 2019-01-17 ENCOUNTER — Other Ambulatory Visit: Payer: Self-pay

## 2019-01-17 ENCOUNTER — Ambulatory Visit (INDEPENDENT_AMBULATORY_CARE_PROVIDER_SITE_OTHER): Payer: Medicare Other | Admitting: Internal Medicine

## 2019-01-17 ENCOUNTER — Encounter: Payer: Self-pay | Admitting: Internal Medicine

## 2019-01-17 VITALS — BP 151/81 | HR 78 | Temp 98.6°F | Ht 65.5 in | Wt 217.9 lb

## 2019-01-17 DIAGNOSIS — Z23 Encounter for immunization: Secondary | ICD-10-CM | POA: Diagnosis not present

## 2019-01-17 DIAGNOSIS — Z86010 Personal history of colonic polyps: Secondary | ICD-10-CM | POA: Diagnosis not present

## 2019-01-17 DIAGNOSIS — C8332 Diffuse large B-cell lymphoma, intrathoracic lymph nodes: Secondary | ICD-10-CM

## 2019-01-17 DIAGNOSIS — Z79899 Other long term (current) drug therapy: Secondary | ICD-10-CM

## 2019-01-17 DIAGNOSIS — I10 Essential (primary) hypertension: Secondary | ICD-10-CM

## 2019-01-17 DIAGNOSIS — Z0000 Encounter for general adult medical examination without abnormal findings: Secondary | ICD-10-CM

## 2019-01-17 NOTE — Patient Instructions (Signed)
[name],  It was a pleasure taking care of you at the clinic today.  I am glad to see you are doing well and that you have been exercising.  We are going to repeat your blood work today and also recheck your blood pressure.  If your blood pressure is high, I would like to start you on a second blood pressure medication.  Take care! Dr. Eileen Stanford  Please call the internal medicine center clinic if you have any questions or concerns, we may be able to help and keep you from a long and expensive emergency room wait. Our clinic and after hours phone number is [phone], the best time to call is Monday through Friday 9 am to 4 pm but there is always someone available 24/7 if you have an emergency. If you need medication refills please notify your pharmacy one week in advance and they will send [place] a request.

## 2019-01-17 NOTE — Assessment & Plan Note (Signed)
Hypertension: Well-controlled.  Initial blood pressure elevated at 151/81 however repeat was 133/80  BP Readings from Last 3 Encounters:  01/17/19 (!) 151/81  01/10/19 114/68  01/23/18 133/80   Plan: -Continue chlorthalidone 50 mg daily -Follow BMP

## 2019-01-17 NOTE — Assessment & Plan Note (Signed)
1. Descending colon polyp: Due to family history and prior history of tubular adenoma he recently had a colonoscopy which showed a 5 mm tubular adenoma.  He is to repeat colonoscopy in 3 years.  2. Flu vaccine today

## 2019-01-17 NOTE — Progress Notes (Signed)
   CC: Follow-up hypertension  HPI:  [name] is a 58 y.o. very pleasant African-American gentleman with medical history listed below presenting to follow-up on chronic medical problems.  Please see problem based charting for further details.  Past Medical History:  Diagnosis Date  . Anemia   . Cancer ([place])    lymphoma  . Cataract    bilateral  . Depression   . DISSEMINATED SUPERFICIAL ACTINIC POROKERATOSIS 09/12/2008   Qualifier: Diagnosis of  By: Annamaria Boots MD, Ysidro Evert    . Dizziness 12/21/2017  . GERD (gastroesophageal reflux disease)   . Hemorrhoids   . Hepatic cirrhosis ([place]) 11/06/2014  . Hepatitis C   . History of hepatitis C 03/07/2006   Qualifier: Diagnosis of  By: Conception Chancy MD, Hinton Dyer    . Hypertension   . Neuropathy 04/30/2016  . Non Hodgkin's lymphoma ([place])   . Pulmonary nodule 07/08/2015  . Pulmonary nodule 07/08/2015  . Rectal bleeding   . Schizophrenia ([place])   . Umbilical hernia without obstruction or gangrene 07/30/2016   Review of Systems: As per HPI  Physical Exam:  Vitals:   01/17/19 1401  BP: (!) 151/81  Pulse: 78  Temp: 98.6 F (37 C)  TempSrc: Oral  SpO2: 100%  Weight: 217 lb 14.4 oz (98.8 kg)  Height: 5' 5.5" (1.664 m)   Physical Exam  Constitutional: He is well-developed, well-nourished, and in no distress.  HENT:  Head: Normocephalic and atraumatic.  Eyes: Conjunctivae are normal.  Cardiovascular: Normal rate, regular rhythm and normal heart sounds.  Pulmonary/Chest: Effort normal and breath sounds normal. He has no wheezes.  Abdominal: Soft. Bowel sounds are normal. Distention: Due to body habitus. There is no abdominal tenderness.    Assessment & Plan:   See Encounters Tab for problem based charting.  Patient discussed with Dr. Evette Doffing

## 2019-01-17 NOTE — Assessment & Plan Note (Signed)
Non-Hodgkin's lymphoma: Follows up with oncology on a yearly basis.  CT chest, abdomen and pelvis performed in September 2019 showed stable left inguinal node with no evidence of suspicious lymphadenopathy in the chest, abdomen or pelvis.  His spleen size was at the upper limit of normal.  Plan: -Follow-up oncology -Follow-up CBC

## 2019-01-18 LAB — CBC
Hematocrit: 41.9 % (ref 37.5–51.0)
Hemoglobin: 13.5 g/dL (ref 13.0–17.7)
MCH: 25.4 pg — ABNORMAL LOW (ref 26.6–33.0)
MCHC: 32.2 g/dL (ref 31.5–35.7)
MCV: 79 fL (ref 79–97)
Platelets: 170 10*3/uL (ref 150–450)
RBC: 5.31 x10E6/uL (ref 4.14–5.80)
RDW: 12.9 % (ref 11.6–15.4)
WBC: 5.4 10*3/uL (ref 3.4–10.8)

## 2019-01-18 LAB — BMP8+ANION GAP
Anion Gap: 17 mmol/L (ref 10.0–18.0)
BUN/Creatinine Ratio: 10 (ref 10–24)
BUN: 10 mg/dL (ref 8–27)
CO2: 24 mmol/L (ref 20–29)
Calcium: 9.5 mg/dL (ref 8.6–10.2)
Chloride: 99 mmol/L (ref 96–106)
Creatinine, Ser: 0.96 mg/dL (ref 0.76–1.27)
GFR calc Af Amer: 99 mL/min/{1.73_m2} (ref >59)
GFR calc non Af Amer: 86 mL/min/{1.73_m2} (ref >59)
Glucose: 95 mg/dL (ref 65–99)
Potassium: 3.8 mmol/L (ref 3.5–5.2)
Sodium: 140 mmol/L (ref 134–144)

## 2019-01-18 NOTE — Progress Notes (Signed)
Internal Medicine Clinic Attending  Case discussed with Dr. Agyei at the time of the visit.  We reviewed the resident's history and exam and pertinent patient test results.  I agree with the assessment, diagnosis, and plan of care documented in the resident's note.    

## 2019-01-19 ENCOUNTER — Encounter: Payer: Self-pay | Admitting: Internal Medicine

## 2019-01-19 ENCOUNTER — Ambulatory Visit (HOSPITAL_COMMUNITY)
Admission: RE | Admit: 2019-01-19 | Discharge: 2019-01-19 | Disposition: A | Discharge status: Home / Self Care | Payer: Medicare Other | Source: Ambulatory Visit | Attending: Internal Medicine | Admitting: Internal Medicine

## 2019-01-19 ENCOUNTER — Inpatient Hospital Stay: Payer: Medicare Other | Attending: Internal Medicine

## 2019-01-19 ENCOUNTER — Other Ambulatory Visit: Payer: Self-pay

## 2019-01-19 DIAGNOSIS — R161 Splenomegaly, not elsewhere classified: Secondary | ICD-10-CM | POA: Insufficient documentation

## 2019-01-19 DIAGNOSIS — K7460 Unspecified cirrhosis of liver: Secondary | ICD-10-CM | POA: Diagnosis not present

## 2019-01-19 DIAGNOSIS — F209 Schizophrenia, unspecified: Secondary | ICD-10-CM | POA: Insufficient documentation

## 2019-01-19 DIAGNOSIS — C8590 Non-Hodgkin lymphoma, unspecified, unspecified site: Secondary | ICD-10-CM | POA: Diagnosis not present

## 2019-01-19 DIAGNOSIS — I10 Essential (primary) hypertension: Secondary | ICD-10-CM | POA: Diagnosis not present

## 2019-01-19 DIAGNOSIS — Z9221 Personal history of antineoplastic chemotherapy: Secondary | ICD-10-CM | POA: Diagnosis not present

## 2019-01-19 DIAGNOSIS — C8332 Diffuse large B-cell lymphoma, intrathoracic lymph nodes: Secondary | ICD-10-CM | POA: Diagnosis not present

## 2019-01-19 DIAGNOSIS — Z79899 Other long term (current) drug therapy: Secondary | ICD-10-CM | POA: Diagnosis not present

## 2019-01-19 LAB — CBC WITH DIFFERENTIAL (CANCER CENTER ONLY)
Abs Immature Granulocytes: 0.04 10*3/uL (ref 0.00–0.07)
Basophils Absolute: 0 10*3/uL (ref 0.0–0.1)
Basophils Relative: 0 %
Eosinophils Absolute: 0 10*3/uL (ref 0.0–0.5)
Eosinophils Relative: 1 %
HCT: 43.7 % (ref 39.0–52.0)
Hemoglobin: 13.7 g/dL (ref 13.0–17.0)
Immature Granulocytes: 1 %
Lymphocytes Relative: 23 %
Lymphs Abs: 1.1 10*3/uL (ref 0.7–4.0)
MCH: 25.1 pg — ABNORMAL LOW (ref 26.0–34.0)
MCHC: 31.4 g/dL (ref 30.0–36.0)
MCV: 80.2 fL (ref 80.0–100.0)
Monocytes Absolute: 0.5 10*3/uL (ref 0.1–1.0)
Monocytes Relative: 10 %
Neutro Abs: 3.2 10*3/uL (ref 1.7–7.7)
Neutrophils Relative %: 65 %
Platelet Count: 158 10*3/uL (ref 150–400)
RBC: 5.45 MIL/uL (ref 4.22–5.81)
RDW: 12.9 % (ref 11.5–15.5)
WBC Count: 4.9 10*3/uL (ref 4.0–10.5)
nRBC: 0 % (ref 0.0–0.2)

## 2019-01-19 LAB — CMP (CANCER CENTER ONLY)
ALT: 21 U/L (ref 0–44)
AST: 19 U/L (ref 15–41)
Albumin: 4.3 g/dL (ref 3.5–5.0)
Alkaline Phosphatase: 58 U/L (ref 38–126)
Anion gap: 9 (ref 5–15)
BUN: 11 mg/dL (ref 6–20)
CO2: 30 mmol/L (ref 22–32)
Calcium: 9.9 mg/dL (ref 8.9–10.3)
Chloride: 102 mmol/L (ref 98–111)
Creatinine: 1.21 mg/dL (ref 0.61–1.24)
GFR, Est AFR Am: >60 mL/min (ref >60)
GFR, Estimated: >60 mL/min (ref >60)
Glucose, Bld: 123 mg/dL — ABNORMAL HIGH (ref 70–99)
Potassium: 3.5 mmol/L (ref 3.5–5.1)
Sodium: 141 mmol/L (ref 135–145)
Total Bilirubin: 0.7 mg/dL (ref 0.3–1.2)
Total Protein: 8.4 g/dL — ABNORMAL HIGH (ref 6.5–8.1)

## 2019-01-19 LAB — LACTATE DEHYDROGENASE: LDH: 137 U/L (ref 98–192)

## 2019-01-19 MED ORDER — IOHEXOL 300 MG/ML  SOLN
100.0000 mL | Freq: Once | INTRAMUSCULAR | Status: AC | PRN
  Administered 2019-01-19: 100 mL via INTRAVENOUS

## 2019-01-19 MED ORDER — SODIUM CHLORIDE (PF) 0.9 % IJ SOLN
INTRAMUSCULAR | Status: AC
  Filled 2019-01-19: qty 50

## 2019-01-23 ENCOUNTER — Other Ambulatory Visit: Payer: Self-pay

## 2019-01-23 ENCOUNTER — Encounter: Payer: Self-pay | Admitting: Internal Medicine

## 2019-01-23 ENCOUNTER — Inpatient Hospital Stay (HOSPITAL_BASED_OUTPATIENT_CLINIC_OR_DEPARTMENT_OTHER): Payer: Medicare Other | Admitting: Internal Medicine

## 2019-01-23 VITALS — BP 144/75 | HR 84 | Temp 98.3°F | Resp 18 | Ht 65.5 in | Wt 218.8 lb

## 2019-01-23 DIAGNOSIS — Z79899 Other long term (current) drug therapy: Secondary | ICD-10-CM | POA: Diagnosis not present

## 2019-01-23 DIAGNOSIS — C8332 Diffuse large B-cell lymphoma, intrathoracic lymph nodes: Secondary | ICD-10-CM | POA: Diagnosis not present

## 2019-01-23 DIAGNOSIS — C8590 Non-Hodgkin lymphoma, unspecified, unspecified site: Secondary | ICD-10-CM | POA: Diagnosis not present

## 2019-01-23 DIAGNOSIS — Z9221 Personal history of antineoplastic chemotherapy: Secondary | ICD-10-CM | POA: Diagnosis not present

## 2019-01-23 DIAGNOSIS — I10 Essential (primary) hypertension: Secondary | ICD-10-CM | POA: Diagnosis not present

## 2019-01-23 NOTE — Progress Notes (Signed)
[place] [phone]   [phone]  OFFICE PROGRESS NOTE  PRINCIPAL DIAGNOSIS: Stage IV non-Hodgkin lymphoma diagnosed in March 2009.   PRIOR THERAPY:  1. Status post 4 weekly doses of Rituxan during the patient's hospitalization at the intensive care unit at [facility] with respiratory failure. 2. Status post 7 cycles of systemic chemotherapy with CHOP/Rituxan. Last dose was given January 15, 2008.  CURRENT THERAPY: Observation.  INTERVAL HISTORY: [name] 58 y.o. male returns to the clinic today for follow-up visit.  The patient is feeling fine today with no concerning complaints.  He denied having any recent weight loss or night sweats.  He has no palpable lymphadenopathy.  He denied having any chest pain, shortness of breath, cough or hemoptysis.  He has no fever or chills.  He has no nausea, vomiting, diarrhea or constipation.  He has no bleeding issues.  He had repeat CT scan of the chest, abdomen pelvis performed recently and he is here for evaluation and discussion of his scan results.  MEDICAL HISTORY: Past Medical History:  Diagnosis Date  . Anemia   . Cancer ([place])    lymphoma  . Cataract    bilateral  . Depression   . DISSEMINATED SUPERFICIAL ACTINIC POROKERATOSIS 09/12/2008   Qualifier: Diagnosis of  By: Annamaria Boots MD, Ysidro Evert    . Dizziness 12/21/2017  . GERD (gastroesophageal reflux disease)   . Hemorrhoids   . Hepatic cirrhosis ([place]) 11/06/2014  . Hepatitis C   . History of hepatitis C 03/07/2006   Qualifier: Diagnosis of  By: Conception Chancy MD, Hinton Dyer    . Hypertension   . Neuropathy 04/30/2016  . Non Hodgkin's lymphoma ([place])   . Pulmonary nodule 07/08/2015  . Pulmonary nodule 07/08/2015  . Rectal bleeding   . Schizophrenia ([place])   . Umbilical hernia without obstruction or gangrene 07/30/2016    ALLERGIES:  has No Known Allergies.  MEDICATIONS:  Current Outpatient Medications  Medication Sig Dispense Refill  .  benztropine (COGENTIN) 0.5 MG tablet     . chlorthalidone (HYGROTON) 50 MG tablet TAKE 1 TABLET ONCE DAILY. 90 tablet 0  . gabapentin (NEURONTIN) 300 MG capsule Take 1 capsule (300 mg total) by mouth 3 (three) times daily. 90 capsule 3  . OLANZapine (ZYPREXA) 20 MG tablet     . perphenazine (TRILAFON) 8 MG tablet Take 8 mg by mouth 2 (two) times daily.     No current facility-administered medications for this visit.     REVIEW OF SYSTEMS:  A comprehensive review of systems was negative.   PHYSICAL EXAMINATION: General appearance: alert, cooperative and no distress Head: Normocephalic, without obvious abnormality, atraumatic Neck: no adenopathy Lymph nodes: Cervical, supraclavicular, and axillary nodes normal. Resp: clear to auscultation bilaterally Back: symmetric, no curvature. ROM normal. No CVA tenderness. Cardio: regular rate and rhythm, S1, S2 normal, no murmur, click, rub or gallop GI: soft, non-tender; bowel sounds normal; no masses,  no organomegaly Extremities: extremities normal, atraumatic, no cyanosis or edema  ECOG PERFORMANCE STATUS: 0 - Asymptomatic  Blood pressure (!) 144/75, pulse 84, temperature 98.3 F (36.8 C), temperature source Oral, resp. rate 18, height 5' 5.5" (1.664 m), weight 218 lb 12.8 oz (99.2 kg), SpO2 99 %.  LABORATORY DATA: Lab Results  Component Value Date   WBC 4.9 01/19/2019   HGB 13.7 01/19/2019   HCT 43.7 01/19/2019   MCV 80.2 01/19/2019   PLT 158 01/19/2019      Chemistry  Component Value Date/Time   NA 141 01/19/2019 0949   NA 140 01/17/2019 1454   NA 142 01/06/2017 0945   K 3.5 01/19/2019 0949   K 4.5 01/06/2017 0945   CL 102 01/19/2019 0949   CL 107 06/20/2012 1032   CO2 30 01/19/2019 0949   CO2 26 01/06/2017 0945   BUN 11 01/19/2019 0949   BUN 10 01/17/2019 1454   BUN 12.2 01/06/2017 0945   CREATININE 1.21 01/19/2019 0949   CREATININE 1.2 01/06/2017 0945      Component Value Date/Time   CALCIUM 9.9 01/19/2019 0949    CALCIUM 10.0 01/06/2017 0945   ALKPHOS 58 01/19/2019 0949   ALKPHOS 62 01/06/2017 0945   AST 19 01/19/2019 0949   AST 27 01/06/2017 0945   ALT 21 01/19/2019 0949   ALT 28 01/06/2017 0945   BILITOT 0.7 01/19/2019 0949   BILITOT 0.91 01/06/2017 0945       RADIOGRAPHIC STUDIES: Ct Chest W Contrast  Result Date: 01/19/2019 CLINICAL DATA:  Followup lymphoma. EXAM: CT CHEST, ABDOMEN, AND PELVIS WITH CONTRAST TECHNIQUE: Multidetector CT imaging of the chest, abdomen and pelvis was performed following the standard protocol during bolus administration of intravenous contrast. CONTRAST:  137mL OMNIPAQUE IOHEXOL 300 MG/ML  SOLN COMPARISON:  01/18/2018 FINDINGS: CT CHEST FINDINGS Cardiovascular: No significant vascular findings. Normal heart size. No pericardial effusion. Mediastinum/Nodes: No enlarged mediastinal, hilar, or axillary lymph nodes. Thyroid gland, trachea, and esophagus demonstrate no significant findings. Lungs/Pleura: No pleural effusion. Centrilobular and paraseptal emphysema. Scar noted within the right middle lobe, lingula and left lower lobe. No pleural effusion, airspace consolidation, or pneumothorax identified. Tiny granuloma within the lateral aspect of the superior segment of right lower lobe is unchanged, image 75/4. Small granuloma in the posteromedial right base is also unchanged, image 112/4. Musculoskeletal: No chest wall mass or suspicious bone lesions identified. CT ABDOMEN PELVIS FINDINGS Hepatobiliary: The liver has a diffusely nodular contour and there is hypertrophy of both caudate and lateral segment of left lobe. Imaging findings are compatible with cirrhosis. No focal liver abnormality identified. Over the anus along the inferior margin of the right lobe there is a ovoid subcapsular, slightly exophytic low-density structure measuring 2.5 cm and 41 HU. Not significantly changed from previous exam dating back to 05/07/2015. This favors a benign abnormality. Gallbladder  normal. No biliary dilatation. Pancreas: Unremarkable. No pancreatic ductal dilatation or surrounding inflammatory changes. Spleen: Measures 12.1 x 5.7 by 13 cm (volume = 470 cm^3). Previously 12.0 x 6.3 by 13 cm (volume = 510 cm^3). Similar appearance of scattered low-attenuation foci within the spleen. Adrenals/Urinary Tract: Normal appearance of the adrenal glands. The kidneys are unremarkable. Urinary bladder is unremarkable. Stomach/Bowel: Stomach is within normal limits. Appendix appears normal. No evidence of bowel wall thickening, distention, or inflammatory changes. Vascular/Lymphatic: Mild aortic atherosclerosis. The portal vein and hepatic veins appear patent. No abdominal adenopathy. No pelvic or inguinal adenopathy. Ovoid, well-circumscribed low-attenuation structure in the left inguinal region measures 2.7 x 2.2 cm and is remained unchanged since 12/18/2008. Reproductive: Prostate is unremarkable. Other: No abdominal wall hernia or abnormality. No abdominopelvic ascites. Musculoskeletal: No acute or significant osseous findings. IMPRESSION: 1. Stable exam. No adenopathy identified within the chest, abdomen or pelvis. 2. Borderline splenomegaly with scattered low-density foci within the spleen. Unchanged and nonspecific in the setting of cirrhosis. 3. Ovoid, low-density structure within the left inguinal region has remains stable since 12/18/2008. 4. Morphologic features of liver compatible with cirrhosis. Electronically Signed   By: Lovena Le  Clovis Riley M.D.   On: 01/19/2019 14:46   Ct Abdomen Pelvis W Contrast  Result Date: 01/19/2019 CLINICAL DATA:  Followup lymphoma. EXAM: CT CHEST, ABDOMEN, AND PELVIS WITH CONTRAST TECHNIQUE: Multidetector CT imaging of the chest, abdomen and pelvis was performed following the standard protocol during bolus administration of intravenous contrast. CONTRAST:  194mL OMNIPAQUE IOHEXOL 300 MG/ML  SOLN COMPARISON:  01/18/2018 FINDINGS: CT CHEST FINDINGS Cardiovascular: No  significant vascular findings. Normal heart size. No pericardial effusion. Mediastinum/Nodes: No enlarged mediastinal, hilar, or axillary lymph nodes. Thyroid gland, trachea, and esophagus demonstrate no significant findings. Lungs/Pleura: No pleural effusion. Centrilobular and paraseptal emphysema. Scar noted within the right middle lobe, lingula and left lower lobe. No pleural effusion, airspace consolidation, or pneumothorax identified. Tiny granuloma within the lateral aspect of the superior segment of right lower lobe is unchanged, image 75/4. Small granuloma in the posteromedial right base is also unchanged, image 112/4. Musculoskeletal: No chest wall mass or suspicious bone lesions identified. CT ABDOMEN PELVIS FINDINGS Hepatobiliary: The liver has a diffusely nodular contour and there is hypertrophy of both caudate and lateral segment of left lobe. Imaging findings are compatible with cirrhosis. No focal liver abnormality identified. Over the anus along the inferior margin of the right lobe there is a ovoid subcapsular, slightly exophytic low-density structure measuring 2.5 cm and 41 HU. Not significantly changed from previous exam dating back to 05/07/2015. This favors a benign abnormality. Gallbladder normal. No biliary dilatation. Pancreas: Unremarkable. No pancreatic ductal dilatation or surrounding inflammatory changes. Spleen: Measures 12.1 x 5.7 by 13 cm (volume = 470 cm^3). Previously 12.0 x 6.3 by 13 cm (volume = 510 cm^3). Similar appearance of scattered low-attenuation foci within the spleen. Adrenals/Urinary Tract: Normal appearance of the adrenal glands. The kidneys are unremarkable. Urinary bladder is unremarkable. Stomach/Bowel: Stomach is within normal limits. Appendix appears normal. No evidence of bowel wall thickening, distention, or inflammatory changes. Vascular/Lymphatic: Mild aortic atherosclerosis. The portal vein and hepatic veins appear patent. No abdominal adenopathy. No pelvic or  inguinal adenopathy. Ovoid, well-circumscribed low-attenuation structure in the left inguinal region measures 2.7 x 2.2 cm and is remained unchanged since 12/18/2008. Reproductive: Prostate is unremarkable. Other: No abdominal wall hernia or abnormality. No abdominopelvic ascites. Musculoskeletal: No acute or significant osseous findings. IMPRESSION: 1. Stable exam. No adenopathy identified within the chest, abdomen or pelvis. 2. Borderline splenomegaly with scattered low-density foci within the spleen. Unchanged and nonspecific in the setting of cirrhosis. 3. Ovoid, low-density structure within the left inguinal region has remains stable since 12/18/2008. 4. Morphologic features of liver compatible with cirrhosis. Electronically Signed   By: Kerby Moors M.D.   On: 01/19/2019 14:46    ASSESSMENT AND PLAN: This is a very pleasant 58 years old [place] American male with history of stage IV non-Hodgkin lymphoma diagnosed in March of 2009 status post systemic chemotherapy and has been observation since September of 2009. The patient is doing fine today with no concerning complaints. He had repeat CT scan of the chest, abdomen pelvis performed recently.  I personally and independently reviewed the scans and discussed the results with the patient today. His scan showed no concerning findings for disease recurrence. I recommended for him to continue on observation with repeat CT scan of the chest, abdomen pelvis in 1 year. The patient was advised to call immediately if he has any concerning symptoms in the interval.  All questions were answered. The patient knows to call the clinic with any problems, questions or concerns. We can certainly  see the patient much sooner if necessary.  Disclaimer: This note was dictated with voice recognition software. Similar sounding words can inadvertently be transcribed and may not be corrected upon review.

## 2019-01-24 ENCOUNTER — Telehealth: Payer: Self-pay | Admitting: Internal Medicine

## 2019-01-24 NOTE — Telephone Encounter (Signed)
Scheduled appt per 9/29 los - mailed reminder letter with appt date and time . Central radiology to contact pt with ct scan

## 2019-03-20 ENCOUNTER — Ambulatory Visit: Payer: Medicare Other | Admitting: Podiatry

## 2019-03-20 ENCOUNTER — Other Ambulatory Visit: Payer: Self-pay | Admitting: Internal Medicine

## 2019-03-20 NOTE — Telephone Encounter (Signed)
Next appt scheduled 07/18/19 with PCP.

## 2019-06-15 ENCOUNTER — Other Ambulatory Visit: Payer: Self-pay | Admitting: Internal Medicine

## 2019-07-16 ENCOUNTER — Ambulatory Visit (INDEPENDENT_AMBULATORY_CARE_PROVIDER_SITE_OTHER): Payer: Medicare Other | Admitting: Internal Medicine

## 2019-07-16 ENCOUNTER — Encounter: Payer: Self-pay | Admitting: Internal Medicine

## 2019-07-16 VITALS — BP 131/82 | HR 79 | Temp 98.8°F | Ht 65.5 in | Wt 214.5 lb

## 2019-07-16 DIAGNOSIS — I10 Essential (primary) hypertension: Secondary | ICD-10-CM | POA: Diagnosis not present

## 2019-07-16 DIAGNOSIS — Z0000 Encounter for general adult medical examination without abnormal findings: Secondary | ICD-10-CM

## 2019-07-16 DIAGNOSIS — F209 Schizophrenia, unspecified: Secondary | ICD-10-CM

## 2019-07-16 DIAGNOSIS — Z79899 Other long term (current) drug therapy: Secondary | ICD-10-CM

## 2019-07-16 MED ORDER — CHLORTHALIDONE 50 MG PO TABS: 50.0000 mg | ORAL_TABLET | Freq: Every day | ORAL | 3 refills | Status: DC

## 2019-07-16 NOTE — Progress Notes (Signed)
   CC: Follow-up hypertension  HPI:  [name] is a 59 y.o. very pleasant African-American gentleman with medical history listed below presented to follow-up on chronic medical problems.  Past Medical History:  Diagnosis Date  . Anemia   . Cancer ([place])    lymphoma  . Cataract    bilateral  . Depression   . DISSEMINATED SUPERFICIAL ACTINIC POROKERATOSIS 09/12/2008   Qualifier: Diagnosis of  By: Annamaria Boots MD, Ysidro Evert    . Dizziness 12/21/2017  . GERD (gastroesophageal reflux disease)   . Hemorrhoids   . Hepatic cirrhosis ([place]) 11/06/2014  . Hepatitis C   . History of hepatitis C 03/07/2006   Qualifier: Diagnosis of  By: Conception Chancy MD, Hinton Dyer    . Hypertension   . Neuropathy 04/30/2016  . Non Hodgkin's lymphoma ([place])   . Pulmonary nodule 07/08/2015  . Pulmonary nodule 07/08/2015  . Rectal bleeding   . Schizophrenia ([place])   . Umbilical hernia without obstruction or gangrene 07/30/2016   Review of Systems:  As per HPI  Physical Exam:  Vitals:   07/16/19 1321  BP: (!) 155/81  Pulse: 81  Temp: 98.8 F (37.1 C)  TempSrc: Oral  SpO2: 98%  Weight: 214 lb 8 oz (97.3 kg)  Height: 5' 5.5" (1.664 m)   Physical Exam  Constitutional: He is well-developed, well-nourished, and in no distress.  HENT:  Head: Normocephalic and atraumatic.  Cardiovascular: Normal rate and regular rhythm.  Pulmonary/Chest: Effort normal and breath sounds normal. He has no wheezes. He has no rales.    Assessment & Plan:   See Encounters Tab for problem based charting.  Patient discussed with Dr. Rebeca Alert

## 2019-07-16 NOTE — Progress Notes (Signed)
Internal Medicine Clinic Attending  Case discussed with Dr. Agyei at the time of the visit.  We reviewed the resident's history and exam and pertinent patient test results.  I agree with the assessment, diagnosis, and plan of care documented in the resident's note.  Jeymi Hepp, M.D., Ph.D.  

## 2019-07-16 NOTE — Assessment & Plan Note (Signed)
Health maintenance: Up-to-date

## 2019-07-16 NOTE — Assessment & Plan Note (Signed)
Schizophrenia: He is currently on Zyprexa, Cogentin and Trilafon.  He states that sometimes he has intermittent hallucination however denies any active suicidal ideation or homicidal ideation.  He has a follow-up appointment to see his psychiatrist on July 24, 2019.  His PHQ-9 score today is 10

## 2019-07-16 NOTE — Patient Instructions (Addendum)
[name],   It was a pleasure seeing you in the clinic today.  Overall, you are doing well and I would encourage you to continue exercising.   I am checking blood work today.  Your repeat blood pressure was good and I would not make any changes to your medicine.  Take care! Dr. Eileen Stanford  Please call the internal medicine center clinic if you have any questions or concerns, we may be able to help and keep you from a long and expensive emergency room wait. Our clinic and after hours phone number is [phone], the best time to call is Monday through Friday 9 am to 4 pm but there is always someone available 24/7 if you have an emergency. If you need medication refills please notify your pharmacy one week in advance and they will send [place] a request.

## 2019-07-16 NOTE — Assessment & Plan Note (Signed)
Hypertension: Not at goal.  BPs have ranged in the 110s-150s/50s-80s.  He states that he has been going to the gym and exercising at the community center.  He has not had weight gain since I last saw him.   Plan: -Continue chlorthalidone 50 mg daily -BMP today

## 2019-07-17 LAB — BMP8+ANION GAP
Anion Gap: 13 mmol/L (ref 10.0–18.0)
BUN/Creatinine Ratio: 10 (ref 10–24)
BUN: 12 mg/dL (ref 8–27)
CO2: 25 mmol/L (ref 20–29)
Calcium: 9.7 mg/dL (ref 8.6–10.2)
Chloride: 100 mmol/L (ref 96–106)
Creatinine, Ser: 1.26 mg/dL (ref 0.76–1.27)
GFR calc Af Amer: 71 mL/min/{1.73_m2} (ref >59)
GFR calc non Af Amer: 62 mL/min/{1.73_m2} (ref >59)
Glucose: 95 mg/dL (ref 65–99)
Potassium: 3.7 mmol/L (ref 3.5–5.2)
Sodium: 138 mmol/L (ref 134–144)

## 2019-07-18 ENCOUNTER — Encounter: Payer: Medicare Other | Admitting: Internal Medicine

## 2019-07-20 ENCOUNTER — Ambulatory Visit: Payer: Medicare Other | Admitting: Podiatry

## 2019-08-08 DIAGNOSIS — B353 Tinea pedis: Secondary | ICD-10-CM | POA: Diagnosis not present

## 2019-08-08 DIAGNOSIS — L602 Onychogryphosis: Secondary | ICD-10-CM | POA: Diagnosis not present

## 2019-08-08 DIAGNOSIS — B351 Tinea unguium: Secondary | ICD-10-CM | POA: Diagnosis not present

## 2019-08-08 DIAGNOSIS — M792 Neuralgia and neuritis, unspecified: Secondary | ICD-10-CM | POA: Diagnosis not present

## 2019-08-15 DIAGNOSIS — B351 Tinea unguium: Secondary | ICD-10-CM | POA: Diagnosis not present

## 2019-08-29 DIAGNOSIS — B353 Tinea pedis: Secondary | ICD-10-CM | POA: Diagnosis not present

## 2019-08-29 DIAGNOSIS — L602 Onychogryphosis: Secondary | ICD-10-CM | POA: Diagnosis not present

## 2019-08-29 DIAGNOSIS — M792 Neuralgia and neuritis, unspecified: Secondary | ICD-10-CM | POA: Diagnosis not present

## 2019-08-29 DIAGNOSIS — B351 Tinea unguium: Secondary | ICD-10-CM | POA: Diagnosis not present

## 2019-10-17 ENCOUNTER — Ambulatory Visit: Payer: Medicare Other | Admitting: Podiatry

## 2020-01-02 ENCOUNTER — Encounter: Payer: Self-pay | Admitting: Internal Medicine

## 2020-01-02 ENCOUNTER — Ambulatory Visit (INDEPENDENT_AMBULATORY_CARE_PROVIDER_SITE_OTHER): Payer: Medicare Other | Admitting: Internal Medicine

## 2020-01-02 ENCOUNTER — Other Ambulatory Visit: Payer: Self-pay

## 2020-01-02 VITALS — BP 137/88 | HR 82 | Temp 98.5°F | Ht 66.0 in | Wt 217.0 lb

## 2020-01-02 DIAGNOSIS — Z23 Encounter for immunization: Secondary | ICD-10-CM | POA: Diagnosis not present

## 2020-01-02 DIAGNOSIS — F209 Schizophrenia, unspecified: Secondary | ICD-10-CM

## 2020-01-02 DIAGNOSIS — I10 Essential (primary) hypertension: Secondary | ICD-10-CM | POA: Diagnosis not present

## 2020-01-02 DIAGNOSIS — C8592 Non-Hodgkin lymphoma, unspecified, intrathoracic lymph nodes: Secondary | ICD-10-CM

## 2020-01-02 DIAGNOSIS — C8332 Diffuse large B-cell lymphoma, intrathoracic lymph nodes: Secondary | ICD-10-CM

## 2020-01-02 NOTE — Assessment & Plan Note (Signed)
Hypertension: Well-controlled  BP Readings from Last 3 Encounters:  01/02/20 137/88  07/16/19 131/82  01/23/19 (!) 144/75   Plan: -Continue chlorthalidone 50 mg daily

## 2020-01-02 NOTE — Assessment & Plan Note (Signed)
Schizophrenia: Follows up with psychiatry.  He states that his medicines were recently adjusted and he is tolerating them well.

## 2020-01-02 NOTE — Assessment & Plan Note (Signed)
Non-Hodgkin's lymphoma: He has a follow-up appointment with oncology on September 29

## 2020-01-02 NOTE — Progress Notes (Signed)
   CC: Follow-up hypertension  HPI:  [name] is a 59 y.o. with medical history significant for hypertension, non-Hodgkin's lymphoma, schizophrenia here to follow-up on chronic medical problems.  Please see problem based charting for further details.  Past Medical History:  Diagnosis Date  . Anemia   . Cancer ([place])    lymphoma  . Cataract    bilateral  . Depression   . DISSEMINATED SUPERFICIAL ACTINIC POROKERATOSIS 09/12/2008   Qualifier: Diagnosis of  By: Annamaria Boots MD, Ysidro Evert    . Dizziness 12/21/2017  . GERD (gastroesophageal reflux disease)   . Hemorrhoids   . Hepatic cirrhosis ([place]) 11/06/2014  . Hepatitis C   . History of hepatitis C 03/07/2006   Qualifier: Diagnosis of  By: Conception Chancy MD, Hinton Dyer    . Hypertension   . Neuropathy 04/30/2016  . Non Hodgkin's lymphoma ([place])   . Pulmonary nodule 07/08/2015  . Pulmonary nodule 07/08/2015  . Rectal bleeding   . Schizophrenia ([place])   . Umbilical hernia without obstruction or gangrene 07/30/2016   Review of Systems:  As per HPI  Physical Exam:  Vitals:   01/02/20 1318  BP: 137/88  Pulse: 82  Temp: 98.5 F (36.9 C)  TempSrc: Oral  SpO2: 98%  Weight: 217 lb (98.4 kg)  Height: 5\' 6"  (1.676 m)   Physical Exam Vitals and nursing note reviewed.  Cardiovascular:     Heart sounds: No murmur heard.   Pulmonary:     Effort: Pulmonary effort is normal.     Breath sounds: Normal breath sounds. No rales.  Neurological:     Mental Status: He is alert.  Psychiatric:        Mood and Affect: Mood normal.        Behavior: Behavior normal.     Assessment & Plan:   See Encounters Tab for problem based charting.  Patient discussed with Dr. Dareen Piano

## 2020-01-02 NOTE — Patient Instructions (Signed)
[name],  It was a pleasure taking care of you in the clinic today.  Overall, you are doing well and I would not make any changes to your medicines.  Please continue follow-up with your psychiatrist and oncologist.  Take care! Dr. Eileen Stanford  Please call the internal medicine center clinic if you have any questions or concerns, we may be able to help and keep you from a long and expensive emergency room wait. Our clinic and after hours phone number is [phone], the best time to call is Monday through Friday 9 am to 4 pm but there is always someone available 24/7 if you have an emergency. If you need medication refills please notify your pharmacy one week in advance and they will send [place] a request.

## 2020-01-03 NOTE — Progress Notes (Signed)
Internal Medicine Clinic Attending  Case discussed with Dr. Agyei  At the time of the visit.  We reviewed the resident's history and exam and pertinent patient test results.  I agree with the assessment, diagnosis, and plan of care documented in the resident's note.  

## 2020-01-21 ENCOUNTER — Encounter (HOSPITAL_COMMUNITY): Payer: Self-pay

## 2020-01-21 ENCOUNTER — Ambulatory Visit (HOSPITAL_COMMUNITY)
Admission: RE | Admit: 2020-01-21 | Discharge: 2020-01-21 | Disposition: A | Discharge status: Home / Self Care | Payer: Medicare Other | Source: Ambulatory Visit | Attending: Internal Medicine | Admitting: Internal Medicine

## 2020-01-21 ENCOUNTER — Other Ambulatory Visit: Payer: Self-pay

## 2020-01-21 ENCOUNTER — Inpatient Hospital Stay: Payer: Medicare Other | Attending: Internal Medicine

## 2020-01-21 DIAGNOSIS — Z8572 Personal history of non-Hodgkin lymphomas: Secondary | ICD-10-CM | POA: Insufficient documentation

## 2020-01-21 DIAGNOSIS — C8590 Non-Hodgkin lymphoma, unspecified, unspecified site: Secondary | ICD-10-CM | POA: Diagnosis not present

## 2020-01-21 DIAGNOSIS — C8332 Diffuse large B-cell lymphoma, intrathoracic lymph nodes: Secondary | ICD-10-CM

## 2020-01-21 DIAGNOSIS — N400 Enlarged prostate without lower urinary tract symptoms: Secondary | ICD-10-CM | POA: Diagnosis not present

## 2020-01-21 DIAGNOSIS — R599 Enlarged lymph nodes, unspecified: Secondary | ICD-10-CM | POA: Diagnosis not present

## 2020-01-21 DIAGNOSIS — J8410 Pulmonary fibrosis, unspecified: Secondary | ICD-10-CM | POA: Insufficient documentation

## 2020-01-21 DIAGNOSIS — Z79899 Other long term (current) drug therapy: Secondary | ICD-10-CM | POA: Diagnosis not present

## 2020-01-21 DIAGNOSIS — I10 Essential (primary) hypertension: Secondary | ICD-10-CM | POA: Insufficient documentation

## 2020-01-21 DIAGNOSIS — K219 Gastro-esophageal reflux disease without esophagitis: Secondary | ICD-10-CM | POA: Insufficient documentation

## 2020-01-21 DIAGNOSIS — F209 Schizophrenia, unspecified: Secondary | ICD-10-CM | POA: Diagnosis not present

## 2020-01-21 DIAGNOSIS — J439 Emphysema, unspecified: Secondary | ICD-10-CM | POA: Insufficient documentation

## 2020-01-21 DIAGNOSIS — I700 Atherosclerosis of aorta: Secondary | ICD-10-CM | POA: Insufficient documentation

## 2020-01-21 DIAGNOSIS — F329 Major depressive disorder, single episode, unspecified: Secondary | ICD-10-CM | POA: Diagnosis not present

## 2020-01-21 DIAGNOSIS — I2510 Atherosclerotic heart disease of native coronary artery without angina pectoris: Secondary | ICD-10-CM | POA: Diagnosis not present

## 2020-01-21 DIAGNOSIS — K7460 Unspecified cirrhosis of liver: Secondary | ICD-10-CM | POA: Diagnosis not present

## 2020-01-21 DIAGNOSIS — Z9221 Personal history of antineoplastic chemotherapy: Secondary | ICD-10-CM | POA: Diagnosis not present

## 2020-01-21 DIAGNOSIS — K429 Umbilical hernia without obstruction or gangrene: Secondary | ICD-10-CM | POA: Diagnosis not present

## 2020-01-21 LAB — CMP (CANCER CENTER ONLY)
ALT: 17 U/L (ref 0–44)
AST: 20 U/L (ref 15–41)
Albumin: 3.5 g/dL (ref 3.5–5.0)
Alkaline Phosphatase: 46 U/L (ref 38–126)
Anion gap: 8 (ref 5–15)
BUN: 10 mg/dL (ref 8–23)
CO2: 30 mmol/L (ref 22–32)
Calcium: 9.9 mg/dL (ref 8.9–10.3)
Chloride: 102 mmol/L (ref 98–111)
Creatinine: 1.08 mg/dL (ref 0.61–1.24)
GFR, Est AFR Am: >60 mL/min (ref >60)
GFR, Estimated: >60 mL/min (ref >60)
Glucose, Bld: 108 mg/dL — ABNORMAL HIGH (ref 70–99)
Potassium: 3.5 mmol/L (ref 3.5–5.1)
Sodium: 140 mmol/L (ref 135–145)
Total Bilirubin: 0.8 mg/dL (ref 0.3–1.2)
Total Protein: 8.8 g/dL — ABNORMAL HIGH (ref 6.5–8.1)

## 2020-01-21 LAB — LACTATE DEHYDROGENASE: LDH: 192 U/L (ref 98–192)

## 2020-01-21 LAB — CBC WITH DIFFERENTIAL (CANCER CENTER ONLY)
Abs Immature Granulocytes: 0.1 10*3/uL — ABNORMAL HIGH (ref 0.00–0.07)
Basophils Absolute: 0 10*3/uL (ref 0.0–0.1)
Basophils Relative: 1 %
Eosinophils Absolute: 0 10*3/uL (ref 0.0–0.5)
Eosinophils Relative: 0 %
HCT: 39.1 % (ref 39.0–52.0)
Hemoglobin: 12.4 g/dL — ABNORMAL LOW (ref 13.0–17.0)
Immature Granulocytes: 2 %
Lymphocytes Relative: 18 %
Lymphs Abs: 1.2 10*3/uL (ref 0.7–4.0)
MCH: 24.7 pg — ABNORMAL LOW (ref 26.0–34.0)
MCHC: 31.7 g/dL (ref 30.0–36.0)
MCV: 77.9 fL — ABNORMAL LOW (ref 80.0–100.0)
Monocytes Absolute: 0.5 10*3/uL (ref 0.1–1.0)
Monocytes Relative: 8 %
Neutro Abs: 4.8 10*3/uL (ref 1.7–7.7)
Neutrophils Relative %: 71 %
Platelet Count: 245 10*3/uL (ref 150–400)
RBC: 5.02 MIL/uL (ref 4.22–5.81)
RDW: 12.8 % (ref 11.5–15.5)
WBC Count: 6.7 10*3/uL (ref 4.0–10.5)
nRBC: 0 % (ref 0.0–0.2)

## 2020-01-21 MED ORDER — IOHEXOL 300 MG/ML  SOLN
100.0000 mL | Freq: Once | INTRAMUSCULAR | Status: AC | PRN
  Administered 2020-01-21: 100 mL via INTRAVENOUS

## 2020-01-23 ENCOUNTER — Telehealth: Payer: Self-pay | Admitting: Internal Medicine

## 2020-01-23 ENCOUNTER — Encounter: Payer: Self-pay | Admitting: Internal Medicine

## 2020-01-23 ENCOUNTER — Other Ambulatory Visit: Payer: Self-pay

## 2020-01-23 ENCOUNTER — Inpatient Hospital Stay (HOSPITAL_BASED_OUTPATIENT_CLINIC_OR_DEPARTMENT_OTHER): Payer: Medicare Other | Admitting: Internal Medicine

## 2020-01-23 VITALS — BP 156/85 | HR 85 | Temp 97.9°F | Resp 18 | Ht 66.0 in | Wt 210.3 lb

## 2020-01-23 DIAGNOSIS — C8332 Diffuse large B-cell lymphoma, intrathoracic lymph nodes: Secondary | ICD-10-CM | POA: Diagnosis not present

## 2020-01-23 DIAGNOSIS — J439 Emphysema, unspecified: Secondary | ICD-10-CM | POA: Diagnosis not present

## 2020-01-23 DIAGNOSIS — K219 Gastro-esophageal reflux disease without esophagitis: Secondary | ICD-10-CM | POA: Diagnosis not present

## 2020-01-23 DIAGNOSIS — J8410 Pulmonary fibrosis, unspecified: Secondary | ICD-10-CM | POA: Diagnosis not present

## 2020-01-23 DIAGNOSIS — I10 Essential (primary) hypertension: Secondary | ICD-10-CM | POA: Diagnosis not present

## 2020-01-23 DIAGNOSIS — Z9221 Personal history of antineoplastic chemotherapy: Secondary | ICD-10-CM | POA: Diagnosis not present

## 2020-01-23 DIAGNOSIS — Z8572 Personal history of non-Hodgkin lymphomas: Secondary | ICD-10-CM | POA: Diagnosis not present

## 2020-01-23 DIAGNOSIS — Z79899 Other long term (current) drug therapy: Secondary | ICD-10-CM | POA: Diagnosis not present

## 2020-01-23 NOTE — Progress Notes (Signed)
[facility] [phone]   [phone]  OFFICE PROGRESS NOTE  PRINCIPAL DIAGNOSIS: Stage IV non-Hodgkin lymphoma diagnosed in March 2009.   PRIOR THERAPY:  1. Status post 4 weekly doses of Rituxan during the patient's hospitalization at the intensive care unit at [facility] with respiratory failure. 2. Status post 7 cycles of systemic chemotherapy with CHOP/Rituxan. Last dose was given January 15, 2008.  CURRENT THERAPY: Observation.  INTERVAL HISTORY: [name] 59 y.o. male returns to the clinic today for follow-up visit accompanied by his sister.  The patient is feeling fine today with no concerning complaints.  He denied having any recent weight loss or night sweats.  He has no nausea, vomiting, diarrhea or constipation.  He has no headache or visual changes.  He denied having any chest pain, shortness of breath, cough or hemoptysis.  The patient had repeat CT scan of the chest, abdomen pelvis performed recently and he is here for evaluation and discussion of his discuss results.  MEDICAL HISTORY: Past Medical History:  Diagnosis Date  . Anemia   . Cancer ([place])    lymphoma  . Cataract    bilateral  . Depression   . DISSEMINATED SUPERFICIAL ACTINIC POROKERATOSIS 09/12/2008   Qualifier: Diagnosis of  By: Annamaria Boots MD, Ysidro Evert    . Dizziness 12/21/2017  . GERD (gastroesophageal reflux disease)   . Hemorrhoids   . Hepatic cirrhosis ([place]) 11/06/2014  . Hepatitis C   . History of hepatitis C 03/07/2006   Qualifier: Diagnosis of  By: Conception Chancy MD, Hinton Dyer    . Hypertension   . Neuropathy 04/30/2016  . Non Hodgkin's lymphoma ([place])   . Pulmonary nodule 07/08/2015  . Pulmonary nodule 07/08/2015  . Rectal bleeding   . Schizophrenia ([place])   . Umbilical hernia without obstruction or gangrene 07/30/2016    ALLERGIES:  has No Known Allergies.  MEDICATIONS:  Current Outpatient Medications  Medication Sig Dispense Refill  . benztropine (COGENTIN) 0.5  MG tablet     . chlorthalidone (HYGROTON) 50 MG tablet Take 1 tablet (50 mg total) by mouth daily. 90 tablet 3  . gabapentin (NEURONTIN) 300 MG capsule Take 1 capsule (300 mg total) by mouth 3 (three) times daily. 90 capsule 3  . OLANZapine (ZYPREXA) 20 MG tablet     . perphenazine (TRILAFON) 16 MG tablet Take 1 tablet by mouth at bedtime.    . traZODone (DESYREL) 50 MG tablet Take 50 mg by mouth at bedtime.     No current facility-administered medications for this visit.    REVIEW OF SYSTEMS:  A comprehensive review of systems was negative.   PHYSICAL EXAMINATION: General appearance: alert, cooperative and no distress Head: Normocephalic, without obvious abnormality, atraumatic Neck: no adenopathy Lymph nodes: Cervical, supraclavicular, and axillary nodes normal. Resp: clear to auscultation bilaterally Back: symmetric, no curvature. ROM normal. No CVA tenderness. Cardio: regular rate and rhythm, S1, S2 normal, no murmur, click, rub or gallop GI: soft, non-tender; bowel sounds normal; no masses,  no organomegaly Extremities: extremities normal, atraumatic, no cyanosis or edema  ECOG PERFORMANCE STATUS: 0 - Asymptomatic  Blood pressure (!) 156/85, pulse 85, temperature 97.9 F (36.6 C), temperature source Tympanic, resp. rate 18, height 5\' 6"  (1.676 m), weight 210 lb 4.8 oz (95.4 kg), SpO2 98 %.  LABORATORY DATA: Lab Results  Component Value Date   WBC 6.7 01/21/2020   HGB 12.4 (L) 01/21/2020   HCT 39.1 01/21/2020   MCV 77.9 (L) 01/21/2020  PLT 245 01/21/2020      Chemistry      Component Value Date/Time   NA 140 01/21/2020 0905   NA 138 07/16/2019 1407   NA 142 01/06/2017 0945   K 3.5 01/21/2020 0905   K 4.5 01/06/2017 0945   CL 102 01/21/2020 0905   CL 107 06/20/2012 1032   CO2 30 01/21/2020 0905   CO2 26 01/06/2017 0945   BUN 10 01/21/2020 0905   BUN 12 07/16/2019 1407   BUN 12.2 01/06/2017 0945   CREATININE 1.08 01/21/2020 0905   CREATININE 1.2 01/06/2017 0945       Component Value Date/Time   CALCIUM 9.9 01/21/2020 0905   CALCIUM 10.0 01/06/2017 0945   ALKPHOS 46 01/21/2020 0905   ALKPHOS 62 01/06/2017 0945   AST 20 01/21/2020 0905   AST 27 01/06/2017 0945   ALT 17 01/21/2020 0905   ALT 28 01/06/2017 0945   BILITOT 0.8 01/21/2020 0905   BILITOT 0.91 01/06/2017 0945       RADIOGRAPHIC STUDIES: CT Chest W Contrast  Result Date: 01/21/2020 CLINICAL DATA:  Follow-up lymphoma EXAM: CT CHEST, ABDOMEN, AND PELVIS WITH CONTRAST TECHNIQUE: Multidetector CT imaging of the chest, abdomen and pelvis was performed following the standard protocol during bolus administration of intravenous contrast. CONTRAST:  118mL OMNIPAQUE IOHEXOL 300 MG/ML  SOLN COMPARISON:  01/19/2019 FINDINGS: CT CHEST FINDINGS Cardiovascular: Calcified atheromatous plaque in the thoracic aorta, mild and unchanged. No aneurysmal dilation. Normal caliber central pulmonary vasculature. No pericardial effusion. Heart size stable. Mediastinum/Nodes: No adenopathy in the chest. Lungs/Pleura: Mild pulmonary emphysema centrilobular and worse at the lung apices. Calcified granuloma in the RIGHT lung base, tiny pulmonary nodule with calcification in the RIGHT lower lobe. Basilar scarring and or atelectasis in the LEFT chest showing no change from the previous study. Airways are patent. Musculoskeletal: No chest wall lesion. See below for full musculoskeletal details. CT ABDOMEN PELVIS FINDINGS Hepatobiliary: Nodular lobular hepatic contours. No focal, suspicious hepatic lesion on venous phase. Portal vein is patent. No pericholecystic stranding or biliary duct dilation. Pancreas: Pancreas is normal without ductal dilation or sign of inflammation. Spleen: Spleen with stable mild enlargement in the setting of liver disease Adrenals/Urinary Tract: Adrenal glands are normal. Symmetric renal enhancement.  Urinary bladder is normal. Stomach/Bowel: Question some eccentric jejunal wall thickening based on the  pattern of contrast layering along the dependent portion of the jejunum on image 93 through image 97 of series 2, contrast is seen dependently with variable thickness of the wall of the jejunum at this level, asymmetric to other areas of the jejunum. The appendix is normal. The colon is unremarkable aside from scattered colonic diverticulosis. Vascular/Lymphatic: Scattered atherosclerotic plaque. No aneurysmal dilation. There is no gastrohepatic or hepatoduodenal ligament lymphadenopathy. No retroperitoneal or mesenteric lymphadenopathy. No pelvic sidewall lymphadenopathy. Scattered small lymph nodes along the iliac chain on the LEFT less than a cm only mildly increased since previous imaging approximately 5 mm greatest size Reproductive: Prostate unremarkable by CT other than mild enlargement. Other: Fat containing umbilical hernia. Skin thickening along the LEFT gluteal region measuring 3.6 x 1.1 cm. This areas incompletely imaged, mild skin thickening suggested in previous years but not to the extent that is seen on the current study. Soft tissue lesion in the LEFT inguinal crease slightly increased in size 2.9 x 2.0 cm as compared to 2.6 x 1.6 cm on previous imaging. This may represent an enlarged lymph node in the inguinal crease only slightly larger than in 2017  which is the comparison measurement provided above previously 2.0 x 2.7 cm in September of 2020 Musculoskeletal: No acute musculoskeletal process. No destructive bone finding. IMPRESSION: 1. LEFT soft tissue only mildly increased since 2017 and stable since 2018. Attention on follow-up. 2. Question some eccentric jejunal wall thickening based on the pattern of contrast layering along the dependent portion of the jejunum, asymmetric to other areas of the jejunum. Findings could be related to enteritis or focal bowel thickening. Bowel thickening would include underlying mass lesion but segmental appearance argues against neoplasm. Suggest CT enterography  for further assessment and also correlation with any symptoms that would suggest enteritis. No findings of perienteric stranding to suggest active inflammation however. 3. Scattered small lymph nodes along the LEFT iliac chain less than a cm greatest size, only mildly increased since previous imaging. Attention on follow-up. 4. Signs of cirrhosis. Stable splenic size at upper limits of normal approximately 12-13 cm greatest craniocaudal extent. 5. Emphysema and aortic atherosclerosis. Aortic Atherosclerosis (ICD10-I70.0) and Emphysema (ICD10-J43.9). Electronically Signed   By: Zetta Bills M.D.   On: 01/21/2020 13:24   CT Abdomen Pelvis W Contrast  Result Date: 01/21/2020 CLINICAL DATA:  Follow-up lymphoma EXAM: CT CHEST, ABDOMEN, AND PELVIS WITH CONTRAST TECHNIQUE: Multidetector CT imaging of the chest, abdomen and pelvis was performed following the standard protocol during bolus administration of intravenous contrast. CONTRAST:  138mL OMNIPAQUE IOHEXOL 300 MG/ML  SOLN COMPARISON:  01/19/2019 FINDINGS: CT CHEST FINDINGS Cardiovascular: Calcified atheromatous plaque in the thoracic aorta, mild and unchanged. No aneurysmal dilation. Normal caliber central pulmonary vasculature. No pericardial effusion. Heart size stable. Mediastinum/Nodes: No adenopathy in the chest. Lungs/Pleura: Mild pulmonary emphysema centrilobular and worse at the lung apices. Calcified granuloma in the RIGHT lung base, tiny pulmonary nodule with calcification in the RIGHT lower lobe. Basilar scarring and or atelectasis in the LEFT chest showing no change from the previous study. Airways are patent. Musculoskeletal: No chest wall lesion. See below for full musculoskeletal details. CT ABDOMEN PELVIS FINDINGS Hepatobiliary: Nodular lobular hepatic contours. No focal, suspicious hepatic lesion on venous phase. Portal vein is patent. No pericholecystic stranding or biliary duct dilation. Pancreas: Pancreas is normal without ductal dilation or  sign of inflammation. Spleen: Spleen with stable mild enlargement in the setting of liver disease Adrenals/Urinary Tract: Adrenal glands are normal. Symmetric renal enhancement.  Urinary bladder is normal. Stomach/Bowel: Question some eccentric jejunal wall thickening based on the pattern of contrast layering along the dependent portion of the jejunum on image 93 through image 97 of series 2, contrast is seen dependently with variable thickness of the wall of the jejunum at this level, asymmetric to other areas of the jejunum. The appendix is normal. The colon is unremarkable aside from scattered colonic diverticulosis. Vascular/Lymphatic: Scattered atherosclerotic plaque. No aneurysmal dilation. There is no gastrohepatic or hepatoduodenal ligament lymphadenopathy. No retroperitoneal or mesenteric lymphadenopathy. No pelvic sidewall lymphadenopathy. Scattered small lymph nodes along the iliac chain on the LEFT less than a cm only mildly increased since previous imaging approximately 5 mm greatest size Reproductive: Prostate unremarkable by CT other than mild enlargement. Other: Fat containing umbilical hernia. Skin thickening along the LEFT gluteal region measuring 3.6 x 1.1 cm. This areas incompletely imaged, mild skin thickening suggested in previous years but not to the extent that is seen on the current study. Soft tissue lesion in the LEFT inguinal crease slightly increased in size 2.9 x 2.0 cm as compared to 2.6 x 1.6 cm on previous imaging. This may represent an  enlarged lymph node in the inguinal crease only slightly larger than in 2017 which is the comparison measurement provided above previously 2.0 x 2.7 cm in September of 2020 Musculoskeletal: No acute musculoskeletal process. No destructive bone finding. IMPRESSION: 1. LEFT soft tissue only mildly increased since 2017 and stable since 2018. Attention on follow-up. 2. Question some eccentric jejunal wall thickening based on the pattern of contrast  layering along the dependent portion of the jejunum, asymmetric to other areas of the jejunum. Findings could be related to enteritis or focal bowel thickening. Bowel thickening would include underlying mass lesion but segmental appearance argues against neoplasm. Suggest CT enterography for further assessment and also correlation with any symptoms that would suggest enteritis. No findings of perienteric stranding to suggest active inflammation however. 3. Scattered small lymph nodes along the LEFT iliac chain less than a cm greatest size, only mildly increased since previous imaging. Attention on follow-up. 4. Signs of cirrhosis. Stable splenic size at upper limits of normal approximately 12-13 cm greatest craniocaudal extent. 5. Emphysema and aortic atherosclerosis. Aortic Atherosclerosis (ICD10-I70.0) and Emphysema (ICD10-J43.9). Electronically Signed   By: Zetta Bills M.D.   On: 01/21/2020 13:24    ASSESSMENT AND PLAN: This is a very pleasant 59 years old [place] American male with history of stage IV non-Hodgkin lymphoma diagnosed in March of 2009 status post systemic chemotherapy and has been observation since September of 2009. The patient has been on observation for almost 12 years now and he is feeling fine with no concerning complaints. Repeat CT scan of the chest, abdomen pelvis showed no concerning findings for disease recurrence or progression.  The scan showed a focal area of bowel thickening suspicious for inflammatory process but the patient has colonoscopy and GI evaluation last year that was unremarkable except for few polyps that were removed.  He is currently asymptomatic. I recommended for the patient to continue on observation with repeat blood work in 1 year. I also advised him if he has any significant abdominal pain or bloody stool to go immediately to the emergency department or contact his gastroenterologist for evaluation. He was advised to call immediately if he has any  concerning symptoms in the interval. All questions were answered. The patient knows to call the clinic with any problems, questions or concerns. We can certainly see the patient much sooner if necessary.  Disclaimer: This note was dictated with voice recognition software. Similar sounding words can inadvertently be transcribed and may not be corrected upon review.

## 2020-01-23 NOTE — Telephone Encounter (Signed)
Scheduled per 9/29 los. Printed avs and calendar for pt. Pt requested appts to be on wednesdays.

## 2020-02-12 ENCOUNTER — Ambulatory Visit: Payer: Medicare Other | Attending: Internal Medicine

## 2020-02-12 DIAGNOSIS — Z23 Encounter for immunization: Secondary | ICD-10-CM

## 2020-02-12 NOTE — Progress Notes (Signed)
   Covid-19 Vaccination Clinic  Name:  [name]    MRN: [id number] DOB: 1960/10/10  02/12/2020  [name] was observed post Covid-19 immunization for 15 minutes without incident. He was provided with Vaccine Information Sheet and instruction to access the V-Safe system.   [name] was instructed to call 911 with any severe reactions post vaccine: Marland Kitchen Difficulty breathing  . Swelling of face and throat  . A fast heartbeat  . A bad rash all over body  . Dizziness and weakness

## 2020-05-01 ENCOUNTER — Ambulatory Visit (HOSPITAL_COMMUNITY)
Admission: EM | Admit: 2020-05-01 | Discharge: 2020-05-01 | Discharge status: Against Medical Advice | Payer: Medicare Other

## 2020-05-01 ENCOUNTER — Other Ambulatory Visit: Payer: Self-pay

## 2020-05-02 ENCOUNTER — Ambulatory Visit
Admission: EM | Admit: 2020-05-02 | Discharge: 2020-05-02 | Disposition: A | Discharge status: Home / Self Care | Payer: Medicare Other | Attending: Emergency Medicine | Admitting: Emergency Medicine

## 2020-05-02 DIAGNOSIS — R059 Cough, unspecified: Secondary | ICD-10-CM

## 2020-05-02 DIAGNOSIS — J069 Acute upper respiratory infection, unspecified: Secondary | ICD-10-CM | POA: Diagnosis not present

## 2020-05-02 MED ORDER — FLUTICASONE PROPIONATE 50 MCG/ACT NA SUSP: 1.0000 | Freq: Every day | NASAL | 0 refills | Status: DC

## 2020-05-02 MED ORDER — LORATADINE 10 MG PO TABS: 10.0000 mg | ORAL_TABLET | Freq: Every day | ORAL | 0 refills | Status: DC

## 2020-05-02 MED ORDER — BENZONATATE 200 MG PO CAPS: 200.0000 mg | ORAL_CAPSULE | Freq: Three times a day (TID) | ORAL | 0 refills | Status: AC | PRN

## 2020-05-02 NOTE — ED Triage Notes (Signed)
Patient presents to Urgent Care with complaints of cough x5 days. Patient fully vaccinated for covid. No other symptoms.

## 2020-05-02 NOTE — ED Provider Notes (Signed)
[facility]    CSN: [id number] Arrival date & time: 05/02/20  0825      History   Chief Complaint Chief Complaint  Patient presents with  . Cough    HPI [name] is a 60 y.o. male presenting today for evaluation of a cough.  Reports over the past 5 days has had sneezing, congestion as well as cough.  Denies any fevers chills or body aches.  Does report family members with similar symptoms, but denies any known Covid exposure.  Appetite normal.  Patient has vaccinated against Covid and has received booster.  HPI  Past Medical History:  Diagnosis Date  . Anemia   . Cancer ([place])    lymphoma  . Cataract    bilateral  . Depression   . DISSEMINATED SUPERFICIAL ACTINIC POROKERATOSIS 09/12/2008   Qualifier: Diagnosis of  By: Annamaria Boots MD, Ysidro Evert    . Dizziness 12/21/2017  . GERD (gastroesophageal reflux disease)   . Hemorrhoids   . Hepatic cirrhosis ([place]) 11/06/2014  . Hepatitis C   . History of hepatitis C 03/07/2006   Qualifier: Diagnosis of  By: Conception Chancy MD, Hinton Dyer    . Hypertension   . Neuropathy 04/30/2016  . Non Hodgkin's lymphoma ([place])   . Pulmonary nodule 07/08/2015  . Pulmonary nodule 07/08/2015  . Rectal bleeding   . Schizophrenia ([place])   . Umbilical hernia without obstruction or gangrene 07/30/2016    Patient Active Problem List   Diagnosis Date Noted  . Fatigue 04/29/2017  . Diffuse large B-cell lymphoma of intrathoracic lymph nodes ([place]) 01/12/2016  . Severe obesity with body mass index (BMI) of 36.0 to 36.9 08/14/2015  . Healthcare maintenance 09/24/2014  . Erectile dysfunction 08/11/2010  . NHL (non-Hodgkin's lymphoma) ([place]) 01/26/2008  . Schizophrenia, unspecified type ([place]) 03/07/2006  . Hypertension 03/07/2006    Past Surgical History:  Procedure Laterality Date  . COLONOSCOPY    . POLYPECTOMY         Home Medications    Prior to Admission medications   Medication Sig Start Date End Date Taking? Authorizing Provider  benzonatate  (TESSALON) 200 MG capsule Take 1 capsule (200 mg total) by mouth 3 (three) times daily as needed for up to 7 days for cough. 05/02/20 05/09/20 Yes Astria Jordahl C, PA-C  fluticasone (FLONASE) 50 MCG/ACT nasal spray Place 1-2 sprays into both nostrils daily. 05/02/20  Yes Advay Volante C, PA-C  loratadine (CLARITIN) 10 MG tablet Take 1 tablet (10 mg total) by mouth daily. 05/02/20  Yes Evaleen Sant C, PA-C  benztropine (COGENTIN) 0.5 MG tablet  08/14/18   [provider]  chlorthalidone (HYGROTON) 50 MG tablet Take 1 tablet (50 mg total) by mouth daily. 07/16/19   [name], MD  gabapentin (NEURONTIN) 300 MG capsule Take 1 capsule (300 mg total) by mouth 3 (three) times daily. 07/24/18   [name], MD  OLANZapine (ZYPREXA) 20 MG tablet  12/26/18   [provider]  perphenazine (TRILAFON) 16 MG tablet Take 1 tablet by mouth at bedtime. 12/19/19   [provider]  traZODone (DESYREL) 50 MG tablet Take 50 mg by mouth at bedtime. 01/19/20   [provider]    Family History Family History  Problem Relation Age of Onset  . Colon cancer Neg Hx   . Colon polyps Neg Hx   . Esophageal cancer Neg Hx   . Rectal cancer Neg Hx   . Stomach cancer Neg Hx     Social History  Social History   Tobacco Use  . Smoking status: Former Smoker    Quit date: 04/26/1988    Years since quitting: 32.0  . Smokeless tobacco: Never Used  Vaping Use  . Vaping Use: Never used  Substance Use Topics  . Alcohol use: No    Alcohol/week: 0.0 standard drinks  . Drug use: No     Allergies   Patient has no known allergies.   Review of Systems Review of Systems  Constitutional: Negative for activity change, appetite change, chills, fatigue and fever.  HENT: Positive for congestion and sneezing. Negative for ear pain, rhinorrhea, sinus pressure, sore throat and trouble swallowing.   Eyes: Negative for discharge and redness.  Respiratory: Positive for cough. Negative for chest  tightness and shortness of breath.   Cardiovascular: Negative for chest pain.  Gastrointestinal: Negative for abdominal pain, diarrhea, nausea and vomiting.  Musculoskeletal: Negative for myalgias.  Skin: Negative for rash.  Neurological: Negative for dizziness, light-headedness and headaches.     Physical Exam Triage Vital Signs ED Triage Vitals  Enc Vitals Group     BP      Pulse      Resp      Temp      Temp src      SpO2      Weight      Height      Head Circumference      Peak Flow      Pain Score      Pain Loc      Pain Edu?      Excl. in [place]?    No data found.  Updated Vital Signs BP (!) 149/79 (BP Location: Left Arm)   Pulse 72   Temp 98.2 F (36.8 C) (Oral)   Resp 18   Ht 5\' 6"  (1.676 m)   Wt 205 lb (93 kg)   SpO2 95%   BMI 33.09 kg/m   Visual Acuity Right Eye Distance:   Left Eye Distance:   Bilateral Distance:    Right Eye Near:   Left Eye Near:    Bilateral Near:     Physical Exam Vitals and nursing note reviewed.  Constitutional:      Appearance: He is well-developed and well-nourished.     Comments: No acute distress  HENT:     Head: Normocephalic and atraumatic.     Ears:     Comments: Bilateral ears without tenderness to palpation of external auricle, tragus and mastoid, EAC's without erythema or swelling, TM's with good bony landmarks and cone of light. Non erythematous. Bilateral clear effusions    Nose: Nose normal.     Mouth/Throat:     Comments: Oral mucosa pink and moist, no tonsillar enlargement or exudate. Posterior pharynx patent and nonerythematous, no uvula deviation or swelling. Normal phonation. Eyes:     Conjunctiva/sclera: Conjunctivae normal.  Cardiovascular:     Rate and Rhythm: Normal rate.  Pulmonary:     Effort: Pulmonary effort is normal. No respiratory distress.     Comments: Breathing comfortably at rest, CTABL, no wheezing, rales or other adventitious sounds auscultated Abdominal:     General: There is no  distension.  Musculoskeletal:        General: Normal range of motion.     Cervical back: Neck supple.  Skin:    General: Skin is warm and dry.  Neurological:     Mental Status: He is alert and oriented to person, place, and time.  Psychiatric:  Mood and Affect: Mood and affect normal.      UC Treatments / Results  Labs (all labs ordered are listed, but only abnormal results are displayed) Labs Reviewed  NOVEL CORONAVIRUS, NAA    EKG   Radiology No results found.  Procedures Procedures (including critical care time)  Medications Ordered in UC Medications - No data to display  Initial Impression / Assessment and Plan / UC Course  I have reviewed the triage vital signs and the nursing notes.  Pertinent labs & imaging results that were available during my care of the patient were reviewed by me and considered in my medical decision making (see chart for details).     Viral URI with cough-Covid test pending, recommending symptomatic and supportive care, suspect viral etiology.  Continue to monitor symptoms and breathing.  Discussed strict return precautions. Patient verbalized understanding and is agreeable with plan.  Final Clinical Impressions(s) / UC Diagnoses   Final diagnoses:  Cough  Viral URI with cough     Discharge Instructions     Covid test pending, we will only call if positive, approximately 2 to 4 days Tessalon for cough Flonase nasal spray 1 to 2 spray in each nostril daily to further help with congestion, sneezing and fluid on ears Daily Claritin to help with congestion and sneezing Rest and fluids Follow-up if not improving or worsening    ED Prescriptions    Medication Sig Dispense Auth. Provider   benzonatate (TESSALON) 200 MG capsule Take 1 capsule (200 mg total) by mouth 3 (three) times daily as needed for up to 7 days for cough. 28 capsule Zedekiah Hinderman C, PA-C   fluticasone (FLONASE) 50 MCG/ACT nasal spray Place 1-2 sprays  into both nostrils daily. 16 g Adan Baehr C, PA-C   loratadine (CLARITIN) 10 MG tablet Take 1 tablet (10 mg total) by mouth daily. 15 tablet Jaystin Mcgarvey, [place] C, PA-C     PDMP not reviewed this encounter.   Janith Lima, [place] 05/02/20 [phone]

## 2020-05-02 NOTE — Discharge Instructions (Signed)
Covid test pending, we will only call if positive, approximately 2 to 4 days Tessalon for cough Flonase nasal spray 1 to 2 spray in each nostril daily to further help with congestion, sneezing and fluid on ears Daily Claritin to help with congestion and sneezing Rest and fluids Follow-up if not improving or worsening

## 2020-05-06 LAB — NOVEL CORONAVIRUS, NAA: SARS-CoV-2, NAA: DETECTED — AB

## 2020-07-10 ENCOUNTER — Encounter: Payer: Self-pay | Admitting: *Deleted

## 2020-07-10 ENCOUNTER — Encounter: Payer: Self-pay | Admitting: Internal Medicine

## 2020-07-10 NOTE — Progress Notes (Unsigned)

## 2020-07-10 NOTE — Progress Notes (Unsigned)
Things That May Be Affecting Your Health:  Alcohol  Hearing loss X Pain    Depression  Home Safety  Sexual Health   Diabetes  Lack of physical activity X Stress   Difficulty with daily activities  Loneliness  Tiredness   Drug use  Medicines  Tobacco use   Falls  Motor Vehicle Safety X Weight   Food choices  Oral Health  Other    YOUR PERSONALIZED HEALTH PLAN : 1. Schedule your next subsequent Medicare Wellness visit in one year 2. Attend all of your regular appointments to address your medical issues 3. Complete the preventative screenings and services   Annual Wellness Visit   Medicare Covered Preventative Screenings and [place] Men and Women Who How Often Need? Date of Last Service Action  Abdominal Aortic Aneurysm Adults with AAA risk factors Once      Alcohol Misuse and Counseling All Adults Screening once a year if no alcohol misuse. Counseling up to 4 face to face sessions.     Bone Density Measurement  Adults at risk for osteoporosis Once every 2 yrs      Lipid Panel Z13.6 All adults without CV disease Once every 5 yrs       Colorectal Cancer   Stool sample or  Colonoscopy All adults 32 and older   Once every year  Every 10 years        Depression All Adults Once a year  Today   Diabetes Screening Blood glucose, post glucose load, or GTT Z13.1  All adults at risk  Pre-diabetics  Once per year  Twice per year      Diabetes  Self-Management Training All adults Diabetics 10 hrs first year; 2 hours subsequent years. Requires Copay     Glaucoma  Diabetics  Family history of glaucoma  African Americans 38 yrs +  Hispanic Americans 34 yrs + Annually - requires coppay      Hepatitis C Z72.89 or F19.20  High Risk for HCV  Born between 1945 and 1965  Annually  Once      HIV Z11.4 All adults based on risk  Annually btw ages 24 & 72 regardless of risk  Annually > 65 yrs if at increased risk      Lung Cancer Screening  Asymptomatic adults aged 53-77 with 30 pack yr history and current smoker OR quit within the last 15 yrs Annually Must have counseling and shared decision making documentation before first screen      Medical Nutrition Therapy Adults with   Diabetes  Renal disease  Kidney transplant within past 3 yrs 3 hours first year; 2 hours subsequent years     Obesity and Counseling All adults Screening once a year Counseling if BMI 30 or higher X Today   Tobacco Use Counseling Adults who use tobacco  Up to 8 visits in one year     Vaccines Z23  Hepatitis B  Influenza   Pneumonia  Adults   Once  Once every flu season  Two different vaccines separated by one year     Next Annual Wellness Visit People with Medicare Every year X Today     Services & Screenings Women Who How Often Need  Date of Last Service Action  Mammogram  Z12.31 Women over 12 One baseline ages 54-39. Annually ager 40 yrs+      Pap tests All women Annually if high risk. Every 2 yrs for normal risk women  Screening for cervical cancer with   Pap (Z01.419 nl or Z01.411abnl) &  HPV Z11.51 Women aged 47 to 67 Once every 5 yrs     Screening pelvic and breast exams All women Annually if high risk. Every 2 yrs for normal risk women     Sexually Transmitted Diseases  Chlamydia  Gonorrhea  Syphilis All at risk adults Annually for non pregnant females at increased risk         [facility] Men Who How Ofter Need  Date of Last Service Action  Prostate Cancer - DRE & PSA Men over 50 Annually.  DRE might require a copay.        Sexually Transmitted Diseases  Syphilis All at risk adults Annually for men at increased risk      Health Maintenance List Health Maintenance  Topic Date Due  . COVID-19 Vaccine (2 - Pfizer risk 4-dose series) 03/04/2020  . COLONOSCOPY (Pts 45-96yrs Insurance coverage will need to be confirmed)  01/09/2022  . TETANUS/TDAP  01/26/2023  . INFLUENZA VACCINE  Completed   . Hepatitis C Screening  Completed  . HIV Screening  Completed  . HPV VACCINES  Aged Out

## 2020-07-28 ENCOUNTER — Encounter (HOSPITAL_COMMUNITY): Payer: Self-pay

## 2020-07-28 ENCOUNTER — Ambulatory Visit (HOSPITAL_COMMUNITY)
Admission: EM | Admit: 2020-07-28 | Discharge: 2020-07-28 | Disposition: A | Discharge status: Home / Self Care | Payer: Medicare Other | Attending: Student | Admitting: Student

## 2020-07-28 ENCOUNTER — Other Ambulatory Visit: Payer: Self-pay

## 2020-07-28 DIAGNOSIS — T451X5A Adverse effect of antineoplastic and immunosuppressive drugs, initial encounter: Secondary | ICD-10-CM | POA: Diagnosis not present

## 2020-07-28 DIAGNOSIS — B351 Tinea unguium: Secondary | ICD-10-CM

## 2020-07-28 DIAGNOSIS — I10 Essential (primary) hypertension: Secondary | ICD-10-CM | POA: Diagnosis not present

## 2020-07-28 DIAGNOSIS — G620 Drug-induced polyneuropathy: Secondary | ICD-10-CM | POA: Diagnosis not present

## 2020-07-28 MED ORDER — TERBINAFINE HCL 1 % EX CREA: 1.0000 "application " | TOPICAL_CREAM | Freq: Two times a day (BID) | CUTANEOUS | 0 refills | Status: AC

## 2020-07-28 NOTE — ED Provider Notes (Signed)
[facility]    CSN: [id number] Arrival date & time: 07/28/20  6759      History   Chief Complaint No chief complaint on file.   HPI [name] is a 60 y.o. male presenting with R toenail issue. History tinea unguium, chemotherapy-induced neuropathy, non-hodgkin's lymphoma (2009), hep C, schizophrenia.  Patient states that he gets his toenails clipped by podiatry every 2 months, last appointment was 1 month ago.  States that his great toe and second toe on his right foot have been painful, particularly with wearing shoes; states he thinks that he needs his toenails cut again.  States he has been treated with a cream for his toenails in the past, not sure what this is.  History of chemotherapy-induced neuropathy that is unchanged, denies new sensation changes, numbness, tingling.  Denies trauma.  Next appointment with podiatry is in about 1 month in 5/22.  Took his antihypertensives this morning, denies chest pain, dizziness, shortness of breath.  HPI  Past Medical History:  Diagnosis Date  . Anemia   . Cancer ([place])    lymphoma  . Cataract    bilateral  . Depression   . DISSEMINATED SUPERFICIAL ACTINIC POROKERATOSIS 09/12/2008   Qualifier: Diagnosis of  By: Annamaria Boots MD, Ysidro Evert    . Dizziness 12/21/2017  . GERD (gastroesophageal reflux disease)   . Hemorrhoids   . Hepatic cirrhosis ([place]) 11/06/2014  . Hepatitis C   . History of hepatitis C 03/07/2006   Qualifier: Diagnosis of  By: Conception Chancy MD, Hinton Dyer    . Hypertension   . Neuropathy 04/30/2016  . Non Hodgkin's lymphoma ([place])   . Pulmonary nodule 07/08/2015  . Pulmonary nodule 07/08/2015  . Rectal bleeding   . Schizophrenia ([place])   . Umbilical hernia without obstruction or gangrene 07/30/2016    Patient Active Problem List   Diagnosis Date Noted  . Fatigue 04/29/2017  . Diffuse large B-cell lymphoma of intrathoracic lymph nodes ([place]) 01/12/2016  . Severe obesity with body mass index (BMI) of 36.0 to 36.9 08/14/2015  .  Healthcare maintenance 09/24/2014  . Erectile dysfunction 08/11/2010  . NHL (non-Hodgkin's lymphoma) ([place]) 01/26/2008  . Schizophrenia, unspecified type ([place]) 03/07/2006  . Hypertension 03/07/2006    Past Surgical History:  Procedure Laterality Date  . COLONOSCOPY    . POLYPECTOMY         Home Medications    Prior to Admission medications   Medication Sig Start Date End Date Taking? Authorizing Provider  benztropine (COGENTIN) 0.5 MG tablet  08/14/18  Yes [provider]  chlorthalidone (HYGROTON) 50 MG tablet Take 1 tablet (50 mg total) by mouth daily. 07/16/19  Yes Agyei, Caprice Kluver, MD  fluticasone (FLONASE) 50 MCG/ACT nasal spray Place 1-2 sprays into both nostrils daily. 05/02/20  Yes Wieters, Hallie C, PA-C  gabapentin (NEURONTIN) 300 MG capsule Take 1 capsule (300 mg total) by mouth 3 (three) times daily. 07/24/18  Yes Agyei, Caprice Kluver, MD  loratadine (CLARITIN) 10 MG tablet Take 1 tablet (10 mg total) by mouth daily. 05/02/20  Yes Wieters, Hallie C, PA-C  OLANZapine (ZYPREXA) 20 MG tablet  12/26/18  Yes [provider]  perphenazine (TRILAFON) 16 MG tablet Take 1 tablet by mouth at bedtime. 12/19/19  Yes [provider]  terbinafine (LAMISIL AT) 1 % cream Apply 1 application topically 2 (two) times daily for 14 days. Apply to the affected area (arround your toenails) 2x daily for 2 weeks 07/28/20 08/11/20 Yes Hazel Sams, PA-C  traZODone (  DESYREL) 50 MG tablet Take 50 mg by mouth at bedtime. 01/19/20  Yes [provider]    Family History Family History  Problem Relation Age of Onset  . Cancer Mother   . Colon cancer Neg Hx   . Colon polyps Neg Hx   . Esophageal cancer Neg Hx   . Rectal cancer Neg Hx   . Stomach cancer Neg Hx     Social History Social History   Tobacco Use  . Smoking status: Former Smoker    Quit date: 04/26/1988    Years since quitting: 32.2  . Smokeless tobacco: Never Used  Vaping Use  . Vaping Use: Never used  Substance  Use Topics  . Alcohol use: No    Alcohol/week: 0.0 standard drinks  . Drug use: No     Allergies   Patient has no known allergies.   Review of Systems Review of Systems  Musculoskeletal:       R foot pain  Skin:       Toenail issue.  All other systems reviewed and are negative.    Physical Exam Triage Vital Signs ED Triage Vitals  Enc Vitals Group     BP      Pulse      Resp      Temp      Temp src      SpO2      Weight      Height      Head Circumference      Peak Flow      Pain Score      Pain Loc      Pain Edu?      Excl. in [place]?    No data found.  Updated Vital Signs BP (!) 165/91   Pulse 93   Temp 97.9 F (36.6 C)   Resp 18   SpO2 96%   Visual Acuity Right Eye Distance:   Left Eye Distance:   Bilateral Distance:    Right Eye Near:   Left Eye Near:    Bilateral Near:     Physical Exam Vitals reviewed.  Constitutional:      Appearance: Normal appearance.  HENT:     Head: Normocephalic and atraumatic.  Cardiovascular:     Rate and Rhythm: Normal rate and regular rhythm.     Heart sounds: Normal heart sounds.  Pulmonary:     Effort: Pulmonary effort is normal.     Breath sounds: Normal breath sounds.  Skin:    Comments: Toenails- Elongated, thick, discolored brittle toenails with subungual debris and pain on dorsal palpation of nailbeds, worst over 1st and 2nd right toenails. ROM toes intact and without pain, DP palpable, cap refill <2 seconds.  Neurological:     General: No focal deficit present.     Mental Status: He is alert and oriented to person, place, and time.  Psychiatric:        Mood and Affect: Mood normal.        Behavior: Behavior normal.        Thought Content: Thought content normal.        Judgment: Judgment normal.      UC Treatments / Results  Labs (all labs ordered are listed, but only abnormal results are displayed) Labs Reviewed - No data to display  EKG   Radiology No results  found.  Procedures Procedures (including critical care time)  Medications Ordered in UC Medications - No data to display  Initial Impression /  Assessment and Plan / UC Course  I have reviewed the triage vital signs and the nursing notes.  Pertinent labs & imaging results that were available during my care of the patient were reviewed by me and considered in my medical decision making (see chart for details).     This patient is a 60 year old male presenting with exacerbation of chronic tinea unguium. Today this pt is afebrile nontachycardic nontachypneic, oxygenating well on room air, no wheezes rhonchi or rales.   Terbinafine cream sent, and rec f/u with podiatry in next 1-2 weeks.  For hypertension, continue current regimen.  Also rec f/u with PCP for neuropathy management.   ED return precautions discussed.  This chart was dictated using voice recognition software, Dragon. Despite the best efforts of this provider to proofread and correct errors, errors may still occur which can change documentation meaning.  Final Clinical Impressions(s) / UC Diagnoses   Final diagnoses:  Tinea unguium  Essential hypertension  Chemotherapy-induced neuropathy ([place])     Discharge Instructions     -Start the terbinafine cream (Lamisil), apply this twice daily for about 2 weeks. -Please call your podiatrist and ask for an earlier appointment for a recheck and further management. -Continue your antihypertensives as directed. -Follow-up with your primary care provider for further evaluation and management of neuropathy.    ED Prescriptions    Medication Sig Dispense Auth. Provider   terbinafine (LAMISIL AT) 1 % cream Apply 1 application topically 2 (two) times daily for 14 days. Apply to the affected area (arround your toenails) 2x daily for 2 weeks 30 g Hazel Sams, PA-C     PDMP not reviewed this encounter.   Hazel Sams, PA-C 07/28/20 [phone]

## 2020-07-28 NOTE — Discharge Instructions (Signed)
-  Start the terbinafine cream (Lamisil), apply this twice daily for about 2 weeks. -Please call your podiatrist and ask for an earlier appointment for a recheck and further management. -Continue your antihypertensives as directed. -Follow-up with your primary care provider for further evaluation and management of neuropathy.

## 2020-07-28 NOTE — ED Triage Notes (Signed)
Pt here with swelling and pain in right great and second toe for about 3 weeks.

## 2020-07-29 ENCOUNTER — Encounter: Payer: Medicare Other | Admitting: Internal Medicine

## 2020-07-31 ENCOUNTER — Other Ambulatory Visit: Payer: Self-pay

## 2020-07-31 ENCOUNTER — Encounter: Payer: Self-pay | Admitting: Internal Medicine

## 2020-07-31 ENCOUNTER — Ambulatory Visit (INDEPENDENT_AMBULATORY_CARE_PROVIDER_SITE_OTHER): Payer: Medicare Other | Admitting: Internal Medicine

## 2020-07-31 VITALS — BP 117/81 | HR 83 | Temp 98.1°F | Ht 66.0 in | Wt 205.0 lb

## 2020-07-31 DIAGNOSIS — I10 Essential (primary) hypertension: Secondary | ICD-10-CM | POA: Diagnosis not present

## 2020-07-31 DIAGNOSIS — B351 Tinea unguium: Secondary | ICD-10-CM | POA: Diagnosis not present

## 2020-07-31 MED ORDER — CHLORTHALIDONE 50 MG PO TABS: 50.0000 mg | ORAL_TABLET | Freq: Every day | ORAL | 3 refills | Status: DC

## 2020-07-31 NOTE — Patient Instructions (Addendum)
Thank you for allowing [place] to provide your care today. Today we discussed your toe pain    I have ordered no labs for you. I will call if any are abnormal.    Today we made no changes to your medications.    Please follow-up in 6 months.    Should you have any questions or concerns please call the internal medicine clinic at [phone].     Fungal Nail Infection A fungal nail infection is a common infection of the toenails or fingernails. This condition affects toenails more often than fingernails. It often affects the great, or big, toes. More than one nail may be infected. The condition can be passed from person to person (is contagious). What are the causes? This condition is caused by a fungus. Several types of fungi can cause the infection. These fungi are common in moist and warm areas. If your hands or feet come into contact with the fungus, it may get into a crack in your fingernail or toenail and cause the infection. What increases the risk? The following factors may make you more likely to develop this condition:  Being male.  Being of older age.  Living with someone who has the fungus.  Walking barefoot in areas where the fungus thrives, such as showers or locker rooms.  Wearing shoes and socks that cause your feet to sweat.  Having a nail injury or a recent nail surgery.  Having certain medical conditions, such as: ? Athlete's foot. ? Diabetes. ? Psoriasis. ? Poor circulation. ? A weak body defense system (immune system). What are the signs or symptoms? Symptoms of this condition include:  A pale spot on the nail.  Thickening of the nail.  A nail that becomes yellow or brown.  A brittle or ragged nail edge.  A crumbling nail.  A nail that has lifted away from the nail bed.   How is this diagnosed? This condition is diagnosed with a physical exam. Your health care provider may take a scraping or clipping from your nail to test for the fungus. How is  this treated? Treatment is not needed for mild infections. If you have significant nail changes, treatment may include:  Antifungal medicines taken by mouth (orally). You may need to take the medicine for several weeks or several months, and you may not see the results for a long time. These medicines can cause side effects. Ask your health care provider what problems to watch for.  Antifungal nail polish or nail cream. These may be used along with oral antifungal medicines.  Laser treatment of the nail.  Surgery to remove the nail. This may be needed for the most severe infections. It can take a long time, usually up to a year, for the infection to go away. The infection may also come back.   Follow these instructions at home: Medicines  Take or apply over-the-counter and prescription medicines only as told by your health care provider.  Ask your health care provider about using over-the-counter mentholated ointment on your nails. Nail care  Trim your nails often.  Wash and dry your hands and feet every day.  Keep your feet dry: ? Wear absorbent socks, and change your socks frequently. ? Wear shoes that allow air to circulate, such as sandals or canvas tennis shoes. Throw out old shoes.  Do not use artificial nails.  If you go to a nail salon, make sure you choose one that uses clean instruments.  Use antifungal foot powder  on your feet and in your shoes. General instructions  Do not share personal items, such as towels or nail clippers.  Do not walk barefoot in shower rooms or locker rooms.  Wear rubber gloves if you are working with your hands in wet areas.  Keep all follow-up visits as told by your health care provider. This is important. Contact a health care provider if: Your infection is not getting better or it is getting worse after several months. Summary  A fungal nail infection is a common infection of the toenails or fingernails.  Treatment is not needed  for mild infections. If you have significant nail changes, treatment may include taking medicine orally and applying medicine to your nails.  It can take a long time, usually up to a year, for the infection to go away. The infection may also come back.  Take or apply over-the-counter and prescription medicines only as told by your health care provider.  Follow instructions for taking care of your nails to help prevent infection from coming back or spreading. This information is not intended to replace advice given to you by your health care provider. Make sure you discuss any questions you have with your health care provider. Document Revised: 08/03/2018 Document Reviewed: 09/16/2017 Elsevier Patient Education  2021 [employer].

## 2020-07-31 NOTE — Progress Notes (Signed)
   CC: Toe pain  HPI: Scott BullockScott [name] is a 60 y.o. with PMH listed below presenting with complaint of toe pain. Please see problem based assessment and plan for further details.  Past Medical History:  Diagnosis Date  . Anemia   . Cancer ([place])    lymphoma  . Cataract    bilateral  . Depression   . DISSEMINATED SUPERFICIAL ACTINIC POROKERATOSIS 09/12/2008   Qualifier: Diagnosis of  By: Annamaria Boots MD, Ysidro Evert    . Dizziness 12/21/2017  . GERD (gastroesophageal reflux disease)   . Hemorrhoids   . Hepatic cirrhosis ([place]) 11/06/2014  . Hepatitis C   . History of hepatitis C 03/07/2006   Qualifier: Diagnosis of  By: Conception Chancy MD, Hinton Dyer    . Hypertension   . Neuropathy 04/30/2016  . Non Hodgkin's lymphoma ([place])   . Pulmonary nodule 07/08/2015  . Pulmonary nodule 07/08/2015  . Rectal bleeding   . Schizophrenia ([place])   . Umbilical hernia without obstruction or gangrene 07/30/2016   Review of Systems: Review of Systems  Constitutional: Negative for chills, fever and malaise/fatigue.  Eyes: Negative for blurred vision.  Respiratory: Negative for shortness of breath.   Cardiovascular: Negative for chest pain, palpitations and leg swelling.  Gastrointestinal: Negative for constipation, diarrhea, nausea and vomiting.  Neurological: Negative for dizziness.  All other systems reviewed and are negative.   Physical Exam: Vitals:   07/31/20 0939  BP: 117/81  Pulse: 83  Temp: 98.1 F (36.7 C)  TempSrc: Oral  SpO2: 94%  Weight: 205 lb (93 kg)  Height: 5\' 6"  (1.676 m)   Gen: Well-developed, well nourished, NAD HEENT: NCAT head, hearing intact CV: RRR, S1, S2 normal Pulm: CTAB, No rales, no wheezes Extm: ROM intact, Peripheral pulses intact, Thicken toenail on R first and second toe Skin: Dry, Warm, normal turgor, no wounds, no rashes, no lesions  Assessment & Plan:   Hypertension BP Readings from Last 3 Encounters:  07/31/20 117/81  07/28/20 (!) 165/91  05/02/20 (!) 149/79   At goal.  Currently on chlorthalidone 50mg  daily. Denies chest pain, blurry vision, shortness of breath.   - C/w chlorthalidone 50mg  daily  Onychomycosis Scott BullockScott [name] is a 60 yo M w/ PMH of non-hodkin's lymphoma in remission presenting to [facility] after ED visit for toe pain. He mentions having gradual pain of his Right toe with worsening thickness of his toenail and he was evaluated in ED and diagnosed with fungal infection. He was prescribed topical terbinafine and advised to f/u with podiatry. He mentions discomfort with ambulation and has pain of the soft tissue surrounding his toe.  A/P Present with toe pain. Exam shows thickened Great toe toenail pressing up against his second toe. Needs f/u with podiatry for debridement. Chart review shows upcoming appointment.  - C/w terbinafine - F/u with podiatry - Information on proper foot care provided    Patient discussed with Dr. Evette Doffing  -[name], [facility] Pager: [phone]

## 2020-07-31 NOTE — Assessment & Plan Note (Addendum)
[name] is a 60 yo M w/ PMH of non-hodkin's lymphoma in remission presenting to [facility] after ED visit for toe pain. He mentions having gradual pain of his Right toe with worsening thickness of his toenail and he was evaluated in ED and diagnosed with fungal infection. He was prescribed topical terbinafine and advised to f/u with podiatry. He mentions discomfort with ambulation and has pain of the soft tissue surrounding his toe.  A/P Present with toe pain. Exam shows thickened Great toe toenail pressing up against his second toe. Needs f/u with podiatry for debridement. Chart review shows upcoming appointment.  - C/w terbinafine - F/u with podiatry - Information on proper foot care provided

## 2020-07-31 NOTE — Assessment & Plan Note (Signed)
BP Readings from Last 3 Encounters:  07/31/20 117/81  07/28/20 (!) 165/91  05/02/20 (!) 149/79   At goal. Currently on chlorthalidone 50mg  daily. Denies chest pain, blurry vision, shortness of breath.   - C/w chlorthalidone 50mg  daily

## 2020-08-01 NOTE — Progress Notes (Signed)
Internal Medicine Clinic Attending  Case discussed with Dr. Lee  At the time of the visit.  We reviewed the resident's history and exam and pertinent patient test results.  I agree with the assessment, diagnosis, and plan of care documented in the resident's note.    

## 2020-08-21 DIAGNOSIS — Z20822 Contact with and (suspected) exposure to covid-19: Secondary | ICD-10-CM | POA: Diagnosis not present

## 2020-08-27 ENCOUNTER — Ambulatory Visit (INDEPENDENT_AMBULATORY_CARE_PROVIDER_SITE_OTHER): Payer: Medicare Other | Admitting: Podiatry

## 2020-08-27 ENCOUNTER — Other Ambulatory Visit: Payer: Self-pay

## 2020-08-27 ENCOUNTER — Encounter: Payer: Self-pay | Admitting: Podiatry

## 2020-08-27 DIAGNOSIS — M79675 Pain in left toe(s): Secondary | ICD-10-CM | POA: Diagnosis not present

## 2020-08-27 DIAGNOSIS — M79674 Pain in right toe(s): Secondary | ICD-10-CM | POA: Diagnosis not present

## 2020-08-27 DIAGNOSIS — B351 Tinea unguium: Secondary | ICD-10-CM | POA: Diagnosis not present

## 2020-08-27 DIAGNOSIS — L84 Corns and callosities: Secondary | ICD-10-CM | POA: Diagnosis not present

## 2020-08-27 NOTE — Progress Notes (Addendum)
This patient returns to the office for evaluation and treatment of long thick painful nails .  This patient is unable to trim his own nails since the patient cannot reach his feet.  Patient says the nails are painful walking and wearing his shoes. Patient has not been seen in over a year.  Patient also has painful corn fifth toe right foot.  He returns for preventive foot care services.  General Appearance  Alert, conversant and in no acute stress.  Vascular  Dorsalis pedis and posterior tibial  pulses are palpable  bilaterally.  Capillary return is within normal limits  bilaterally. Temperature is within normal limits  bilaterally.  Neurologic  Senn-Weinstein monofilament wire test within normal limits  bilaterally. Muscle power within normal limits bilaterally.  Nails Thick disfigured discolored nails with subungual debris  from hallux to fifth toes bilaterally. No evidence of bacterial infection or drainage bilaterally.  Orthopedic  No limitations of motion  feet .  No crepitus or effusions noted.  No bony pathology or digital deformities noted.  Skin  normotropic skin with no porokeratosis noted bilaterally.  No signs of infections or ulcers noted.  Corn fifth toe right foot.   Onychomycosis  Pain in toes right foot  Pain in toes left foot  Corn fifth toe right foot.  Debridement  of nails  1-5  B/L with a nail nipper.  Nails were then filed using a dremel tool with no incidents.  Debride corn fifth toe right foot with # 15 blade.  RTC  4 months   Gardiner Barefoot DPM

## 2020-09-17 ENCOUNTER — Encounter: Payer: Self-pay | Admitting: *Deleted

## 2020-11-20 ENCOUNTER — Other Ambulatory Visit: Payer: Self-pay

## 2020-11-20 ENCOUNTER — Ambulatory Visit (INDEPENDENT_AMBULATORY_CARE_PROVIDER_SITE_OTHER): Payer: Medicare Other | Admitting: Internal Medicine

## 2020-11-20 VITALS — BP 154/88 | HR 79 | Temp 98.4°F | Ht 65.0 in | Wt 203.7 lb

## 2020-11-20 DIAGNOSIS — F209 Schizophrenia, unspecified: Secondary | ICD-10-CM

## 2020-11-20 DIAGNOSIS — R5382 Chronic fatigue, unspecified: Secondary | ICD-10-CM

## 2020-11-20 DIAGNOSIS — I10 Essential (primary) hypertension: Secondary | ICD-10-CM | POA: Diagnosis not present

## 2020-11-20 DIAGNOSIS — R5383 Other fatigue: Secondary | ICD-10-CM | POA: Diagnosis not present

## 2020-11-20 DIAGNOSIS — R519 Headache, unspecified: Secondary | ICD-10-CM

## 2020-11-20 DIAGNOSIS — C8332 Diffuse large B-cell lymphoma, intrathoracic lymph nodes: Secondary | ICD-10-CM

## 2020-11-20 MED ORDER — LOSARTAN POTASSIUM 25 MG PO TABS: 25.0000 mg | ORAL_TABLET | Freq: Every day | ORAL | 2 refills | Status: DC

## 2020-11-20 NOTE — Progress Notes (Signed)
   CC: fatigue, HTN  HPI:  [name] is a 60 y.o. male with past medical history listed below who presents today to establish with new provider. He does also endorse fatigue and is hypertensive.  [name] states that he wakes each morning around 5-6 a.m. and feels rested, but by the middle of the day he is very tired and weak feeling, often requiring a nap. He does endorse occasional headaches. He does not know if he snores frequently but does believe he does from time to time. He states that he has been taking vitamin B12 but doesn't feel that it is helping. The fatigue is not new to him. Last hemoglobin in 12/2019 was 12.4, TSH was 1.270 normal in 04/2017, and last vitamin B12 check was 863 in 07/2007. He tries to avoid excess coffee consumption and exercises regularly.   Past Medical History:  Diagnosis Date   Anemia    Cancer ([place])    lymphoma   Cataract    bilateral   Depression    DISSEMINATED SUPERFICIAL ACTINIC POROKERATOSIS 09/12/2008   Qualifier: Diagnosis of  By: Annamaria Boots MD, Jeremy     Dizziness 12/21/2017   GERD (gastroesophageal reflux disease)    Hemorrhoids    Hepatic cirrhosis ([place]) 11/06/2014   Hepatitis C    History of hepatitis C 03/07/2006   Qualifier: Diagnosis of  By: Conception Chancy MD, Dana     Hypertension    Neuropathy 04/30/2016   Non Hodgkin's lymphoma ([place])    Pulmonary nodule 07/08/2015   Pulmonary nodule 07/08/2015   Rectal bleeding    Schizophrenia ([place])    Umbilical hernia without obstruction or gangrene 07/30/2016   Review of Systems:  Review of Systems  Constitutional:  Negative for chills, fever and weight loss.  Respiratory:  Negative for shortness of breath.   Cardiovascular:  Negative for chest pain and palpitations.  Gastrointestinal:  Negative for abdominal pain, constipation and diarrhea.  Genitourinary:  Negative for dysuria, frequency and urgency.  Neurological:  Negative for tingling, sensory change, loss of consciousness, weakness and  headaches.   Physical Exam:  Vitals:   11/20/20 1403  BP: (!) 154/88  Pulse: 79  Temp: 98.4 F (36.9 C)  TempSrc: Oral  SpO2: 97%  Weight: 203 lb 11.2 oz (92.4 kg)  Height: '5\' 5"'$  (1.651 m)   Physical Exam Vitals and nursing note reviewed.  Constitutional:      Appearance: Normal appearance.  Cardiovascular:     Rate and Rhythm: Normal rate and regular rhythm.     Pulses:          Radial pulses are 2+ on the right side and 2+ on the left side.     Heart sounds: Normal heart sounds.  Pulmonary:     Effort: Pulmonary effort is normal.     Breath sounds: Normal breath sounds.  Abdominal:     Palpations: Abdomen is soft.     Tenderness: There is no abdominal tenderness.  Skin:    General: Skin is warm and dry.  Neurological:     General: No focal deficit present.     Mental Status: He is alert and oriented to person, place, and time.     Assessment & Plan:   See Encounters Tab for problem based charting.  Patient seen with Dr. Dareen Piano

## 2020-11-20 NOTE — Addendum Note (Signed)
Addended by: Jose Persia on: 11/20/2020 05:22 PM   Modules accepted: Orders

## 2020-11-20 NOTE — Assessment & Plan Note (Addendum)
Patient endorses ongoing daytime fatigue that often causes him to need a nap. He states that he sleeps around 6-7 hours each night but is not sure if he snores frequently. Does endorse occasional headache on waking. He takes vitamin B12 at this time but does not feel it helps much. He avoids excess caffeine. - Recheck vitamin B12 (07/2007 level was 863) - Recheck TSH (04/2017 level was 1.270) - Recheck CBC (hemoglobin of 12.4 12/2019) - Referral for sleep study - Patient encouraged to continue exercising regularly  Addendum 11/21/2020: TSH, hemoglobin normal. Vitamin B12 elevated at 1687. Will advise patient to discontinue supplementation.

## 2020-11-20 NOTE — Assessment & Plan Note (Signed)
Patient feels stable on current medication regimen. He has a dedicated mental health provider that manages his treatment.

## 2020-11-20 NOTE — Assessment & Plan Note (Signed)
Patient follows with oncology on an annual basis. He is scheduled to see them in September. Denies any B symptoms.

## 2020-11-20 NOTE — Patient Instructions (Addendum)
Thank you for visiting the Internal Medicine Clinic today. It was a pleasure to meet you! Today we discussed the fatigue that you are feeling as well as your blood pressure.  I have ordered the following for you:  Lab orders: TSH: To check your thyroid Vitamin B12 CBC: To check your blood counts  Medication changes: Continue chlorthalidone 50 mg daily. Please also start taking losartan 25 mg daily.  Referrals: I have referred you to have a sleep study completed.  Follow-up: In three months to make sure your blood pressure is under good control. We can revisit and, if you are interested at that time, administer your pneumonia and flu vaccines.  Remember: If you have any questions or concerns, please call our clinic at [phone] between 9am-5pm and after hours call [phone] and ask for the internal medicine resident on call. If you feel you are having a medical emergency please call 911.  Farrel Gordon, DO

## 2020-11-20 NOTE — Assessment & Plan Note (Signed)
Patient remains hypertensive at 154/88 despite treatment with chlorthalidone 50 mg daily. - Start losartan 25 mg daily - Follow-up in 3 months for BP check

## 2020-11-21 ENCOUNTER — Telehealth: Payer: Self-pay | Admitting: Internal Medicine

## 2020-11-21 LAB — CBC
Hematocrit: 37.7 % (ref 37.5–51.0)
Hemoglobin: 13.7 g/dL (ref 13.0–17.7)
MCH: 28.4 pg (ref 26.6–33.0)
MCHC: 36.3 g/dL — ABNORMAL HIGH (ref 31.5–35.7)
MCV: 78 fL — ABNORMAL LOW (ref 79–97)
Platelets: 176 10*3/uL (ref 150–450)
RBC: 4.82 x10E6/uL (ref 4.14–5.80)
RDW: 13.1 % (ref 11.6–15.4)
WBC: 5.9 10*3/uL (ref 3.4–10.8)

## 2020-11-21 LAB — TSH: TSH: 0.908 u[IU]/mL (ref 0.450–4.500)

## 2020-11-21 LAB — VITAMIN B12: Vitamin B-12: 1687 pg/mL — ABNORMAL HIGH (ref 232–1245)

## 2020-11-21 NOTE — Telephone Encounter (Signed)
Attempted to call patient twice but the call could not be completed. I wanted to let him know that he can stop taking vitamin B12 supplementation as his level is actually high right now.

## 2020-12-07 NOTE — Addendum Note (Signed)
Addended by: Aldine Contes on: 12/07/2020 02:05 PM   Modules accepted: Level of Service

## 2020-12-07 NOTE — Progress Notes (Signed)
Internal Medicine Clinic Attending  I saw and evaluated the patient.  I personally confirmed the key portions of the history and exam documented by Dr.  Dean  and I reviewed pertinent patient test results.  The assessment, diagnosis, and plan were formulated together and I agree with the documentation in the resident's note.  

## 2020-12-31 ENCOUNTER — Ambulatory Visit (INDEPENDENT_AMBULATORY_CARE_PROVIDER_SITE_OTHER): Payer: Medicare Other | Admitting: Podiatry

## 2020-12-31 ENCOUNTER — Other Ambulatory Visit: Payer: Self-pay

## 2020-12-31 ENCOUNTER — Encounter: Payer: Self-pay | Admitting: Podiatry

## 2020-12-31 DIAGNOSIS — M79675 Pain in left toe(s): Secondary | ICD-10-CM

## 2020-12-31 DIAGNOSIS — B351 Tinea unguium: Secondary | ICD-10-CM | POA: Diagnosis not present

## 2020-12-31 DIAGNOSIS — M79674 Pain in right toe(s): Secondary | ICD-10-CM

## 2020-12-31 NOTE — Progress Notes (Signed)
This patient returns to the office for evaluation and treatment of long thick painful nails .  This patient is unable to trim his own nails since the patient cannot reach his feet.  Patient says the nails are painful walking and wearing his shoes.   He returns for preventive foot care services.  General Appearance  Alert, conversant and in no acute stress.  Vascular  Dorsalis pedis and posterior tibial  pulses are palpable  bilaterally.  Capillary return is within normal limits  bilaterally. Temperature is within normal limits  bilaterally.  Neurologic  Senn-Weinstein monofilament wire test within normal limits  bilaterally. Muscle power within normal limits bilaterally.  Nails Thick disfigured discolored nails with subungual debris  from hallux to fifth toes bilaterally. No evidence of bacterial infection or drainage bilaterally.  Orthopedic  No limitations of motion  feet .  No crepitus or effusions noted.  No bony pathology or digital deformities noted.  Adducto-varus fifth toes  B/L.  Skin  normotropic skin with no porokeratosis noted bilaterally.  No signs of infections or ulcers noted.  Asymptomatic listers corn  B/L.  Onychomycosis  Pain in toes right foot  Pain in toes left foot    Debridement  of nails  1-5  B/L with a nail nipper.  Nails were then filed using a dremel tool with no incidents.    RTC  4 months   Gardiner Barefoot DPM

## 2021-01-21 ENCOUNTER — Other Ambulatory Visit: Payer: Self-pay

## 2021-01-21 ENCOUNTER — Inpatient Hospital Stay: Payer: Medicare Other

## 2021-01-21 ENCOUNTER — Inpatient Hospital Stay: Payer: Medicare Other | Attending: Internal Medicine | Admitting: Internal Medicine

## 2021-01-21 VITALS — BP 135/66 | HR 69 | Temp 98.2°F | Resp 18 | Wt 198.0 lb

## 2021-01-21 DIAGNOSIS — Z8572 Personal history of non-Hodgkin lymphomas: Secondary | ICD-10-CM | POA: Insufficient documentation

## 2021-01-21 DIAGNOSIS — C8332 Diffuse large B-cell lymphoma, intrathoracic lymph nodes: Secondary | ICD-10-CM | POA: Diagnosis not present

## 2021-01-21 DIAGNOSIS — Z9221 Personal history of antineoplastic chemotherapy: Secondary | ICD-10-CM | POA: Insufficient documentation

## 2021-01-21 DIAGNOSIS — E876 Hypokalemia: Secondary | ICD-10-CM | POA: Diagnosis not present

## 2021-01-21 LAB — CBC WITH DIFFERENTIAL (CANCER CENTER ONLY)
Abs Immature Granulocytes: 0.04 10*3/uL (ref 0.00–0.07)
Basophils Absolute: 0 10*3/uL (ref 0.0–0.1)
Basophils Relative: 0 %
Eosinophils Absolute: 0 10*3/uL (ref 0.0–0.5)
Eosinophils Relative: 0 %
HCT: 42.7 % (ref 39.0–52.0)
Hemoglobin: 13.6 g/dL (ref 13.0–17.0)
Immature Granulocytes: 1 %
Lymphocytes Relative: 21 %
Lymphs Abs: 1.2 10*3/uL (ref 0.7–4.0)
MCH: 25.2 pg — ABNORMAL LOW (ref 26.0–34.0)
MCHC: 31.9 g/dL (ref 30.0–36.0)
MCV: 79.2 fL — ABNORMAL LOW (ref 80.0–100.0)
Monocytes Absolute: 0.6 10*3/uL (ref 0.1–1.0)
Monocytes Relative: 10 %
Neutro Abs: 4 10*3/uL (ref 1.7–7.7)
Neutrophils Relative %: 68 %
Platelet Count: 186 10*3/uL (ref 150–400)
RBC: 5.39 MIL/uL (ref 4.22–5.81)
RDW: 12.9 % (ref 11.5–15.5)
WBC Count: 5.9 10*3/uL (ref 4.0–10.5)
nRBC: 0 % (ref 0.0–0.2)

## 2021-01-21 LAB — CMP (CANCER CENTER ONLY)
ALT: 19 U/L (ref 0–44)
AST: 22 U/L (ref 15–41)
Albumin: 4.3 g/dL (ref 3.5–5.0)
Alkaline Phosphatase: 59 U/L (ref 38–126)
Anion gap: 12 (ref 5–15)
BUN: 11 mg/dL (ref 8–23)
CO2: 29 mmol/L (ref 22–32)
Calcium: 10.2 mg/dL (ref 8.9–10.3)
Chloride: 100 mmol/L (ref 98–111)
Creatinine: 1.03 mg/dL (ref 0.61–1.24)
GFR, Estimated: >60 mL/min (ref >60)
Glucose, Bld: 106 mg/dL — ABNORMAL HIGH (ref 70–99)
Potassium: 3.3 mmol/L — ABNORMAL LOW (ref 3.5–5.1)
Sodium: 141 mmol/L (ref 135–145)
Total Bilirubin: 1.3 mg/dL — ABNORMAL HIGH (ref 0.3–1.2)
Total Protein: 8.6 g/dL — ABNORMAL HIGH (ref 6.5–8.1)

## 2021-01-21 LAB — LACTATE DEHYDROGENASE: LDH: 154 U/L (ref 98–192)

## 2021-01-21 NOTE — Progress Notes (Signed)
[place] [phone]   [phone]  OFFICE PROGRESS NOTE  PRINCIPAL DIAGNOSIS: Stage IV non-Hodgkin lymphoma diagnosed in March 2009.   PRIOR THERAPY:  Status post 4 weekly doses of Rituxan during the patient's hospitalization at the intensive care unit at [facility] with respiratory failure. Status post 7 cycles of systemic chemotherapy with CHOP/Rituxan. Last dose was given January 15, 2008.  CURRENT THERAPY: Observation.  INTERVAL HISTORY: [name] 60 y.o. male returns to the clinic today for annual follow-up visit.  The patient is feeling fine today with no concerning complaints.  He denied having any current chest pain, shortness of breath, cough or hemoptysis.  He denied having any nausea, vomiting, diarrhea or constipation.  He has no headache or visual changes.  He denied having any significant weight loss or night sweats.  He has no palpable lymphadenopathy, bleeding or ecchymosis.  He is here today for evaluation and repeat blood work.  MEDICAL HISTORY: Past Medical History:  Diagnosis Date   Anemia    Cancer ([place])    lymphoma   Cataract    bilateral   Depression    DISSEMINATED SUPERFICIAL ACTINIC POROKERATOSIS 09/12/2008   Qualifier: Diagnosis of  By: Annamaria Boots MD, Jeremy     Dizziness 12/21/2017   GERD (gastroesophageal reflux disease)    Hemorrhoids    Hepatic cirrhosis ([place]) 11/06/2014   Hepatitis C    History of hepatitis C 03/07/2006   Qualifier: Diagnosis of  By: Conception Chancy MD, Dana     Hypertension    Neuropathy 04/30/2016   Non Hodgkin's lymphoma ([place])    Pulmonary nodule 07/08/2015   Pulmonary nodule 07/08/2015   Rectal bleeding    Schizophrenia ([place])    Umbilical hernia without obstruction or gangrene 07/30/2016    ALLERGIES:  has No Known Allergies.  MEDICATIONS:  Current Outpatient Medications  Medication Sig Dispense Refill   benztropine (COGENTIN) 0.5 MG tablet      chlorthalidone (HYGROTON) 50 MG  tablet Take 1 tablet (50 mg total) by mouth daily. 90 tablet 3   gabapentin (NEURONTIN) 300 MG capsule Take 1 capsule (300 mg total) by mouth 3 (three) times daily. 90 capsule 3   losartan (COZAAR) 25 MG tablet Take 1 tablet (25 mg total) by mouth daily. 30 tablet 2   OLANZapine (ZYPREXA) 20 MG tablet      perphenazine (TRILAFON) 16 MG tablet Take 1 tablet by mouth at bedtime.     traZODone (DESYREL) 50 MG tablet Take 50 mg by mouth at bedtime.     No current facility-administered medications for this visit.    REVIEW OF SYSTEMS:  A comprehensive review of systems was negative.   PHYSICAL EXAMINATION: General appearance: alert, cooperative, and no distress Head: Normocephalic, without obvious abnormality, atraumatic Neck: no adenopathy Lymph nodes: Cervical, supraclavicular, and axillary nodes normal. Resp: clear to auscultation bilaterally Back: symmetric, no curvature. ROM normal. No CVA tenderness. Cardio: regular rate and rhythm, S1, S2 normal, no murmur, click, rub or gallop GI: soft, non-tender; bowel sounds normal; no masses,  no organomegaly Extremities: extremities normal, atraumatic, no cyanosis or edema  ECOG PERFORMANCE STATUS: 0 - Asymptomatic  Blood pressure 135/66, pulse 69, temperature 98.2 F (36.8 C), temperature source Oral, resp. rate 18, weight 198 lb (89.8 kg), SpO2 100 %.  LABORATORY DATA: Lab Results  Component Value Date   WBC 5.9 01/21/2021   HGB 13.6 01/21/2021   HCT 42.7 01/21/2021   MCV 79.2 (L)  01/21/2021   PLT 186 01/21/2021      Chemistry      Component Value Date/Time   NA 141 01/21/2021 0845   NA 138 07/16/2019 1407   NA 142 01/06/2017 0945   K 3.3 (L) 01/21/2021 0845   K 4.5 01/06/2017 0945   CL 100 01/21/2021 0845   CL 107 06/20/2012 1032   CO2 29 01/21/2021 0845   CO2 26 01/06/2017 0945   BUN 11 01/21/2021 0845   BUN 12 07/16/2019 1407   BUN 12.2 01/06/2017 0945   CREATININE 1.03 01/21/2021 0845   CREATININE 1.2 01/06/2017 0945       Component Value Date/Time   CALCIUM 10.2 01/21/2021 0845   CALCIUM 10.0 01/06/2017 0945   ALKPHOS 59 01/21/2021 0845   ALKPHOS 62 01/06/2017 0945   AST 22 01/21/2021 0845   AST 27 01/06/2017 0945   ALT 19 01/21/2021 0845   ALT 28 01/06/2017 0945   BILITOT 1.3 (H) 01/21/2021 0845   BILITOT 0.91 01/06/2017 0945       RADIOGRAPHIC STUDIES: No results found.   ASSESSMENT AND PLAN: This is a very pleasant 60 years old [place] American male with history of stage IV non-Hodgkin lymphoma diagnosed in March of 2009 status post systemic chemotherapy and has been observation since September of 2009. The patient has been on observation for almost 13 years now and he is feeling fine with no concerning complaints. He had repeat CBC, comprehensive metabolic panel and LDH performed with today.  His lab work is unremarkable for any concerning abnormalities except for mild hypokalemia. I recommended for the patient to continue on observation with repeat blood work as well as CT scan of the chest, abdomen pelvis in 1 year for restaging of his disease. The patient was advised to call immediately if he has any other concerning symptoms in the interval.  All questions were answered. The patient knows to call the clinic with any problems, questions or concerns. We can certainly see the patient much sooner if necessary.  Disclaimer: This note was dictated with voice recognition software. Similar sounding words can inadvertently be transcribed and may not be corrected upon review.

## 2021-01-22 ENCOUNTER — Telehealth: Payer: Self-pay | Admitting: Internal Medicine

## 2021-01-22 NOTE — Telephone Encounter (Signed)
Scheduled appt per 9/28 los - mailed letter with appt date and time   

## 2021-02-18 ENCOUNTER — Other Ambulatory Visit: Payer: Self-pay | Admitting: Internal Medicine

## 2021-04-15 ENCOUNTER — Encounter: Payer: Self-pay | Admitting: Internal Medicine

## 2021-04-15 ENCOUNTER — Ambulatory Visit (INDEPENDENT_AMBULATORY_CARE_PROVIDER_SITE_OTHER): Payer: Medicare Other | Admitting: Internal Medicine

## 2021-04-15 VITALS — BP 130/69 | HR 97 | Wt 190.2 lb

## 2021-04-15 DIAGNOSIS — Z13220 Encounter for screening for lipoid disorders: Secondary | ICD-10-CM | POA: Diagnosis not present

## 2021-04-15 DIAGNOSIS — I10 Essential (primary) hypertension: Secondary | ICD-10-CM | POA: Diagnosis not present

## 2021-04-15 DIAGNOSIS — Z131 Encounter for screening for diabetes mellitus: Secondary | ICD-10-CM

## 2021-04-15 DIAGNOSIS — Z23 Encounter for immunization: Secondary | ICD-10-CM | POA: Diagnosis not present

## 2021-04-15 DIAGNOSIS — E785 Hyperlipidemia, unspecified: Secondary | ICD-10-CM | POA: Insufficient documentation

## 2021-04-15 NOTE — Patient Instructions (Addendum)
Thank you for visiting the Internal Medicine Clinic today. It was a pleasure to see you again!  Today you were seen for a routine checkup.  Your blood pressure looked excellent this morning at 130/69.  Keep up the excellent work with eating healthy, exercising, and taking your medications: Losartan 25 mg daily and chlorthalidone 50 mg daily.  I have ordered the following for you:  Lab orders: HbA1c: To screen for diabetes Lipid panel: To check your cholesterol  Vaccines given today: Pneumonia vaccine  Medication changes: None  Follow-up: In 6 months or sooner if needed.  Remember: If you have any questions or concerns, please call our clinic at [phone] between 9am-5pm and after hours call [phone] and ask for the internal medicine resident on call. If you feel you are having a medical emergency please call 911.  Farrel Gordon, DO

## 2021-04-15 NOTE — Assessment & Plan Note (Addendum)
Patient does not have a history of acute coronary syndrome, MI, revascularization procedures, stroke or TIA, angina.  In terms of what I am able to see in epic, the last time a lipid profile was checked was 2018 and at that time his triglycerides were elevated, however LDL was normal.  He is not currently taking a statin.  He is however on olanzapine which can cause elevations in cholesterol. Assessment: ASCVD risk was calculated using lipid profile results from 2018 and today's blood pressure measurement, and this is 15.5%.  Given that this value is greater than 7.5%, a moderate to high intensity statin is recommended.  Plan: We will collect a lipid profile today and I will recalculate his risk factor on receipt of those results.  Regardless of the results of the lipid profile, he may need to be initiated on a moderate intensity statin.  Addendum: LDL returned above goal at 102. Revised ASCVD of 14.3%. I will send in atorvastatin 20 mg daily and have counseled the patient on continuing to exercise and consume a healthy diet as he has been. I have counseled him on possible side effects and asked that he contact the clinic if he is unable to tolerate the medication.

## 2021-04-15 NOTE — Assessment & Plan Note (Addendum)
Routine checkup for hypertension management.  Patient endorses dizziness secondary to cancer treatment but denies headaches, falls, or other weakness.  He has not had any loss of consciousness. Assessment: Blood pressure was 130/69 today.  BMP collected in September of this year showed normal kidney function. Plan: Blood pressure is at goal.  Continue losartan 25 mg daily and chlorthalidone 50 mg daily.

## 2021-04-15 NOTE — Assessment & Plan Note (Signed)
Patient is due for pneumococcal vaccine at this time. Plan: Vaccine given today.

## 2021-04-15 NOTE — Progress Notes (Signed)
Internal Medicine Clinic Attending  I saw and evaluated the patient.  I personally confirmed the key portions of the history and exam documented by Dr.  Dean  and I reviewed pertinent patient test results.  The assessment, diagnosis, and plan were formulated together and I agree with the documentation in the resident's note.  

## 2021-04-15 NOTE — Progress Notes (Signed)
° °  CC: Routine checkup  HPI:  [name] is a 60 y.o. male with past medical history as stated below presents today for routine checkup.  Please see from a charting for detail assessment and plan.  Past Medical History:  Diagnosis Date   Anemia    Cancer ([place])    lymphoma   Cataract    bilateral   Depression    DISSEMINATED SUPERFICIAL ACTINIC POROKERATOSIS 09/12/2008   Qualifier: Diagnosis of  By: Annamaria Boots MD, Jeremy     Dizziness 12/21/2017   GERD (gastroesophageal reflux disease)    Hemorrhoids    Hepatic cirrhosis ([place]) 11/06/2014   Hepatitis C    History of hepatitis C 03/07/2006   Qualifier: Diagnosis of  By: Conception Chancy MD, Dana     Hypertension    Neuropathy 04/30/2016   Non Hodgkin's lymphoma ([place])    Pulmonary nodule 07/08/2015   Pulmonary nodule 07/08/2015   Rectal bleeding    Schizophrenia ([place])    Umbilical hernia without obstruction or gangrene 07/30/2016   Review of Systems:  Review of Systems  Constitutional:  Positive for malaise/fatigue (some days, especially on days where he works out in the morning). Negative for chills and fever.  Eyes:  Positive for blurred vision (With reading).  Respiratory:  Negative for shortness of breath.   Cardiovascular:  Negative for chest pain and leg swelling.  Gastrointestinal:  Negative for abdominal pain, constipation and diarrhea.  Genitourinary:  Negative for dysuria and urgency.  Musculoskeletal:  Negative for falls (stumbles, dizziness as side effect from medicine).  Neurological:  Positive for dizziness (side effect of medicine, is cautious), sensory change (Neuropathy) and weakness (thinks it's from morning workouts). Negative for tingling, tremors, focal weakness and headaches.  Endo/Heme/Allergies:  Negative for polydipsia.    Physical Exam:  Vitals:   04/15/21 0931  BP: 130/69  Pulse: 97  SpO2: 99%  Weight: 190 lb 3.2 oz (86.3 kg)   Constitutional: Well-appearing gentleman, no acute distress noted. Cardio: Regular  rate and rhythm.  No murmurs, rubs, rubs. Pulm: Clear to auscultation bilaterally. Abdomen: Soft, nontender, nondistended. MSK: Negative for extremity edema. Skin: Skin is warm and dry. Neuro: Alert and oriented x3.  No focal deficit noted.  Normal gait. Psych: Normal mood and affect.  Assessment & Plan:   See Encounters Tab for problem based charting.  Patient seen with Dr. Heber [place]

## 2021-04-15 NOTE — Assessment & Plan Note (Addendum)
Patient endorses drinking 3-4 bottles of water per day, and will drink a bottle at a time rather than sipping over extended period of time.  As a result he does have polyuria from his fluid intake however he does not feel excessively thirsty.  Further he does have neuropathy however this could be attributed to his cancer treatment.  Most recent A1c that I can see is 4.7% in 2014.  He does report a family history of diabetes in his mother. Assessment: It has been 8 years since an HbA1c was checked, at least where I can see.  Of note he is also on olanzapine which can cause hyperglycemia. Plan: We will check HbA1c today.  Addendum: HbA1c 5.1%. No further management necessary at this time.

## 2021-04-16 ENCOUNTER — Telehealth: Payer: Self-pay | Admitting: Internal Medicine

## 2021-04-16 LAB — LIPID PANEL
Chol/HDL Ratio: 3.5 ratio (ref 0.0–5.0)
Cholesterol, Total: 163 mg/dL (ref 100–199)
HDL: 46 mg/dL (ref >39)
LDL Chol Calc (NIH): 102 mg/dL — ABNORMAL HIGH (ref 0–99)
Triglycerides: 79 mg/dL (ref 0–149)
VLDL Cholesterol Cal: 15 mg/dL (ref 5–40)

## 2021-04-16 LAB — HEMOGLOBIN A1C
Est. average glucose Bld gHb Est-mCnc: 100 mg/dL
Hgb A1c MFr Bld: 5.1 % (ref 4.8–5.6)

## 2021-04-16 MED ORDER — ATORVASTATIN CALCIUM 20 MG PO TABS: 20.0000 mg | ORAL_TABLET | Freq: Every day | ORAL | 11 refills | Status: DC

## 2021-04-16 NOTE — Addendum Note (Signed)
Addended by: Renato Battles on: 04/16/2021 09:14 AM   Modules accepted: Orders

## 2021-04-16 NOTE — Telephone Encounter (Signed)
Spoke with the patient to inform him that his cholesterol is slightly above goal and that I would like to start him on a cholesterol medication to get this number to goal and to help prevent long-term complications associated with high cholesterol and heart disease such as heart attack and stroke. I also informed him that his HbA1c was normal and encouraged him to keep up the excellent work with diet and exercise. He voiced understanding and had no further questions.

## 2021-04-29 DIAGNOSIS — H5213 Myopia, bilateral: Secondary | ICD-10-CM | POA: Diagnosis not present

## 2021-05-06 ENCOUNTER — Ambulatory Visit (INDEPENDENT_AMBULATORY_CARE_PROVIDER_SITE_OTHER): Payer: Commercial Managed Care - HMO | Admitting: Podiatry

## 2021-05-06 ENCOUNTER — Other Ambulatory Visit: Payer: Self-pay

## 2021-05-06 ENCOUNTER — Encounter: Payer: Self-pay | Admitting: Podiatry

## 2021-05-06 DIAGNOSIS — B351 Tinea unguium: Secondary | ICD-10-CM

## 2021-05-06 DIAGNOSIS — L84 Corns and callosities: Secondary | ICD-10-CM | POA: Diagnosis not present

## 2021-05-06 DIAGNOSIS — M79675 Pain in left toe(s): Secondary | ICD-10-CM

## 2021-05-06 DIAGNOSIS — M79674 Pain in right toe(s): Secondary | ICD-10-CM | POA: Diagnosis not present

## 2021-05-06 NOTE — Progress Notes (Signed)
This patient returns to the office for evaluation and treatment of long thick painful nails .  This patient is unable to trim his own nails since the patient cannot reach his feet.  Patient says the nails are painful walking and wearing his shoes.  Patient has painful callus on second and fifth toes right foot. He returns for preventive foot care services.  General Appearance  Alert, conversant and in no acute stress.  Vascular  Dorsalis pedis and posterior tibial  pulses are palpable  bilaterally.  Capillary return is within normal limits  bilaterally. Temperature is within normal limits  bilaterally.  Neurologic  Senn-Weinstein monofilament wire test within normal limits  bilaterally. Muscle power within normal limits bilaterally.  Nails Thick disfigured discolored nails with subungual debris  from hallux to fifth toes bilaterally. No evidence of bacterial infection or drainage bilaterally.  Orthopedic  No limitations of motion  feet .  No crepitus or effusions noted.  No bony pathology or digital deformities noted.  Adducto-varus fifth toes  B/L.  Skin  normotropic skin with no porokeratosis noted bilaterally.  No signs of infections or ulcers noted. Clavi 2,5 right foot.  Onychomycosis  Pain in toes right foot  Pain in toes left foot    Debridement  of nails  1-5  B/L with a nail nipper.  Nails were then filed using a dremel tool with no incidents.  Debride clavi with # 15 blade.  RTC  53months   Gardiner Barefoot DPM

## 2021-05-07 ENCOUNTER — Ambulatory Visit: Payer: Medicare Other | Admitting: Podiatry

## 2021-06-06 DIAGNOSIS — H5203 Hypermetropia, bilateral: Secondary | ICD-10-CM | POA: Diagnosis not present

## 2021-07-22 DIAGNOSIS — L309 Dermatitis, unspecified: Secondary | ICD-10-CM | POA: Diagnosis not present

## 2021-08-12 ENCOUNTER — Encounter: Payer: Self-pay | Admitting: Podiatry

## 2021-08-12 ENCOUNTER — Ambulatory Visit (INDEPENDENT_AMBULATORY_CARE_PROVIDER_SITE_OTHER): Payer: Medicare Other | Admitting: Podiatry

## 2021-08-12 DIAGNOSIS — M79675 Pain in left toe(s): Secondary | ICD-10-CM

## 2021-08-12 DIAGNOSIS — L84 Corns and callosities: Secondary | ICD-10-CM | POA: Insufficient documentation

## 2021-08-12 DIAGNOSIS — B351 Tinea unguium: Secondary | ICD-10-CM

## 2021-08-12 DIAGNOSIS — M79674 Pain in right toe(s): Secondary | ICD-10-CM | POA: Diagnosis not present

## 2021-08-12 NOTE — Progress Notes (Signed)
This patient returns to the office for evaluation and treatment of long thick painful nails .  This patient is unable to trim his own nails since the patient cannot reach his feet.  Patient says the nails are painful walking and wearing his shoes.  Patient has painful callus on second and fifth toes right foot. He returns for preventive foot care services. ? ?General Appearance  Alert, conversant and in no acute stress. ? ?Vascular  Dorsalis pedis and posterior tibial  pulses are palpable  bilaterally.  Capillary return is within normal limits  bilaterally. Temperature is within normal limits  bilaterally. ? ?Neurologic  Senn-Weinstein monofilament wire test within normal limits  bilaterally. Muscle power within normal limits bilaterally. ? ?Nails Thick disfigured discolored nails with subungual debris  from hallux to fifth toes bilaterally. No evidence of bacterial infection or drainage bilaterally. ? ?Orthopedic  No limitations of motion  feet .  No crepitus or effusions noted.  No bony pathology or digital deformities noted.  Adducto-varus fifth toes  B/L. ? ?Skin  normotropic skin with no porokeratosis noted bilaterally.  No signs of infections or ulcers noted. Clavi 2,5 right foot. ? ?Onychomycosis  Pain in toes right foot  Pain in toes left foot   ? ?Debridement  of nails  1-5  B/L with a nail nipper.  Nails were then filed using a dremel tool with no incidents.  Debride clavi with # 15 blade.  RTC  32month ? ? ?GGardiner BarefootDPM  ?

## 2021-08-15 ENCOUNTER — Other Ambulatory Visit: Payer: Self-pay | Admitting: Student

## 2021-08-26 DIAGNOSIS — L309 Dermatitis, unspecified: Secondary | ICD-10-CM | POA: Diagnosis not present

## 2021-09-22 ENCOUNTER — Other Ambulatory Visit: Payer: Self-pay

## 2021-09-22 DIAGNOSIS — I10 Essential (primary) hypertension: Secondary | ICD-10-CM

## 2021-09-23 MED ORDER — CHLORTHALIDONE 50 MG PO TABS: 50.0000 mg | ORAL_TABLET | Freq: Every day | ORAL | 3 refills | Status: AC

## 2021-10-15 ENCOUNTER — Encounter: Payer: Self-pay | Admitting: Student

## 2021-10-15 ENCOUNTER — Other Ambulatory Visit: Payer: Self-pay

## 2021-10-15 ENCOUNTER — Ambulatory Visit (INDEPENDENT_AMBULATORY_CARE_PROVIDER_SITE_OTHER): Payer: Medicare Other | Admitting: Student

## 2021-10-15 VITALS — BP 142/68 | HR 74 | Temp 97.5°F | Ht 66.0 in | Wt 179.4 lb

## 2021-10-15 DIAGNOSIS — I10 Essential (primary) hypertension: Secondary | ICD-10-CM | POA: Diagnosis not present

## 2021-10-15 DIAGNOSIS — Z13220 Encounter for screening for lipoid disorders: Secondary | ICD-10-CM | POA: Diagnosis not present

## 2021-10-15 DIAGNOSIS — C8332 Diffuse large B-cell lymphoma, intrathoracic lymph nodes: Secondary | ICD-10-CM

## 2021-10-15 DIAGNOSIS — Z87891 Personal history of nicotine dependence: Secondary | ICD-10-CM | POA: Diagnosis not present

## 2021-10-15 DIAGNOSIS — Z0000 Encounter for general adult medical examination without abnormal findings: Secondary | ICD-10-CM

## 2021-10-15 DIAGNOSIS — E782 Mixed hyperlipidemia: Secondary | ICD-10-CM

## 2021-10-15 MED ORDER — LOSARTAN POTASSIUM 50 MG PO TABS: 50.0000 mg | ORAL_TABLET | Freq: Every day | ORAL | 3 refills | Status: DC

## 2021-10-15 NOTE — Assessment & Plan Note (Signed)
Follows up with Dr. Earlie Server at the cancer center yearly.  Plan to call for his yearly appointment in September.

## 2021-10-15 NOTE — Assessment & Plan Note (Signed)
Patient advised to go to his local pharmacy for his shingles vaccine.

## 2021-10-15 NOTE — Progress Notes (Signed)
   CC: Follow-up  HPI:  [name] is a 61 y.o. male with PMH as below who presents to clinic to follow-up on his chronic medical problems. Please see problem based charting for evaluation, assessment and plan.  Past Medical History:  Diagnosis Date   Anemia    Cancer (HCC)    lymphoma   Cataract    bilateral   Depression    DISSEMINATED SUPERFICIAL ACTINIC POROKERATOSIS 09/12/2008   Qualifier: Diagnosis of  By: Maple Hudson MD, Jeremy     Dizziness 12/21/2017   GERD (gastroesophageal reflux disease)    Hemorrhoids    Hepatic cirrhosis (HCC) 11/06/2014   Hepatitis C    History of hepatitis C 03/07/2006   Qualifier: Diagnosis of  By: Frederico Hamman MD, Dana     Hypertension    Neuropathy 04/30/2016   Non Hodgkin's lymphoma (HCC)    Pulmonary nodule 07/08/2015   Pulmonary nodule 07/08/2015   Rectal bleeding    Schizophrenia (HCC)    Umbilical hernia without obstruction or gangrene 07/30/2016    Review of Systems:  Constitutional: Negative for fever or fatigue Eyes: Negative for visual changes Respiratory: Negative for shortness of breath Cardiac: Negative for chest pain Skin: Negative for bruises or rash Abdomen: Negative for abdominal pain, constipation or diarrhea Neuro: Negative for headache, dizziness or weakness  Physical Exam: General: Pleasant, well-appearing elderly man. No acute distress. HEENT: MMM. No lymphadenopathy. Cardiac: RRR. No murmurs, rubs or gallops. No LE edema Respiratory: Lungs CTAB. No wheezing or crackles. Abdominal: Soft, symmetric and non tender. Normal BS. Skin: Warm, dry and intact without rashes or lesions Extremities: Atraumatic. Full ROM. Palpable radial and DP pulses. Neuro: A&O x 3. Moves all extremities. Normal sensation to gross touch. Psych: Appropriate mood and affect.  Vitals:   10/15/21 1420 10/15/21 1441  BP: (!) 144/79 (!) 142/68  Pulse: 78 74  Temp: (!) 97.5 F (36.4 C)   TempSrc: Oral   SpO2: 100%   Weight: 179 lb 6.4 oz (81.4 kg)    Height: 5\' 6"  (1.676 m)     Assessment & Plan:   See Encounters Tab for problem based charting.  Patient discussed with Dr. West Bali, MD, MPH

## 2021-10-15 NOTE — Assessment & Plan Note (Addendum)
Patient here for blood pressure follow-up.  Patient's BP still elevated with systolic in the 140s. Patient has been adherent to his current blood pressure medications denies any headaches, dizziness or blurry vision.  Last CMP September 2022 showed mild hypokalemia of 3.3.  Plan to increase patient's losartan and monitor blood pressure closely. Vitals:   10/15/21 1420 10/15/21 1441  BP: (!) 144/79 (!) 142/68   Plan: -Increase losartan to 50 mg daily -Continue chlorthalidone 50 mg daily -Follow-up BMP -2 weeks office visit for BP check  Addendum: CMP shows improvement in potassium from 3.3 to 4.4.  Kidney function and LFTs remain normal.

## 2021-10-15 NOTE — Assessment & Plan Note (Addendum)
Patient currently on 20 mg of atorvastatin and tolerating it well. Last lipid panel 7 months ago showed LDL of 102. -Continue atorvastatin 20 mg daily -Repeat lipid panel  Addendum: Repeat lipid panel shows significant improvement in LDL from 102 to 41. Continue atorvastatin 20 mg daily.

## 2021-10-15 NOTE — Patient Instructions (Addendum)
Thank you, Mr.Toni Ambrosius for allowing [place] to provide your care today. Today we discussed your non-Hodgkin's lymphoma, blood pressure and cholesterol.  I have increased one of your blood pressure medications.  Make sure to take 2 tablets of your 25 mg losartan until you run out then pick up the 50 mg tablets.  Make sure to call Dr. Earlie Server to schedule your yearly follow-up appointment.  I have ordered the following labs for you:  Lab Orders         CMP14 + Anion Gap         Lipid Profile      I will call if any are abnormal. All of your labs can be accessed through "My Chart".  I have ordered the following medication/changed the following medications:  Increase Losartan from 25 mg to 50 mg   My Chart Access: [email or web address]  Please follow-up in 2 weeks for BP follow-up  Please make sure to arrive 15 minutes prior to your next appointment. If you arrive late, you may be asked to reschedule.    We look forward to seeing you next time. Please call our clinic at [phone] if you have any questions or concerns. The best time to call is Monday-Friday from 9am-4pm, but there is someone available 24/7. If after hours or the weekend, call the main hospital number and ask for the Internal Medicine Resident On-Call. If you need medication refills, please notify your pharmacy one week in advance and they will send [place] a request.   Thank you for letting us take part in your care. Wishing you the best!  Lacinda Axon, MD 10/15/2021, 2:38 PM IM Resident, PGY-2 [facility] 41:10

## 2021-10-15 NOTE — Assessment & Plan Note (Addendum)
Follows with Dr. Earlie Server at the cancer center.  He has been on observation since 2009.  Labs in September 2022 showed normal CBC. CMP showed only mild hypokalemia. Plan for yearly surveillance with CT chest/abdomen/pelvis. -Patient advised to schedule yearly follow-up with oncology this coming September.

## 2021-10-16 LAB — CMP14 + ANION GAP
ALT: 18 IU/L (ref 0–44)
AST: 16 IU/L (ref 0–40)
Albumin/Globulin Ratio: 1.6 (ref 1.2–2.2)
Albumin: 4.6 g/dL (ref 3.8–4.8)
Alkaline Phosphatase: 75 IU/L (ref 44–121)
Anion Gap: 16 mmol/L (ref 10.0–18.0)
BUN/Creatinine Ratio: 11 (ref 10–24)
BUN: 9 mg/dL (ref 8–27)
Bilirubin Total: 0.6 mg/dL (ref 0.0–1.2)
CO2: 27 mmol/L (ref 20–29)
Calcium: 9.9 mg/dL (ref 8.6–10.2)
Chloride: 99 mmol/L (ref 96–106)
Creatinine, Ser: 0.81 mg/dL (ref 0.76–1.27)
Globulin, Total: 2.9 g/dL (ref 1.5–4.5)
Glucose: 107 mg/dL — ABNORMAL HIGH (ref 70–99)
Potassium: 4.4 mmol/L (ref 3.5–5.2)
Sodium: 142 mmol/L (ref 134–144)
Total Protein: 7.5 g/dL (ref 6.0–8.5)
eGFR: 100 mL/min/{1.73_m2} (ref >59)

## 2021-10-16 LAB — LIPID PANEL
Chol/HDL Ratio: 2.1 ratio (ref 0.0–5.0)
Cholesterol, Total: 109 mg/dL (ref 100–199)
HDL: 51 mg/dL (ref >39)
LDL Chol Calc (NIH): 41 mg/dL (ref 0–99)
Triglycerides: 85 mg/dL (ref 0–149)
VLDL Cholesterol Cal: 17 mg/dL (ref 5–40)

## 2021-10-17 ENCOUNTER — Encounter: Payer: Self-pay | Admitting: *Deleted

## 2021-11-09 ENCOUNTER — Other Ambulatory Visit: Payer: Self-pay

## 2021-11-09 ENCOUNTER — Ambulatory Visit (INDEPENDENT_AMBULATORY_CARE_PROVIDER_SITE_OTHER): Payer: Medicare Other

## 2021-11-09 ENCOUNTER — Encounter: Payer: Self-pay | Admitting: Student

## 2021-11-09 ENCOUNTER — Ambulatory Visit (INDEPENDENT_AMBULATORY_CARE_PROVIDER_SITE_OTHER): Payer: Medicare Other | Admitting: Student

## 2021-11-09 VITALS — BP 132/73 | HR 86 | Temp 98.3°F | Ht 66.0 in | Wt 178.6 lb

## 2021-11-09 DIAGNOSIS — I10 Essential (primary) hypertension: Secondary | ICD-10-CM

## 2021-11-09 DIAGNOSIS — F209 Schizophrenia, unspecified: Secondary | ICD-10-CM

## 2021-11-09 DIAGNOSIS — Z0000 Encounter for general adult medical examination without abnormal findings: Secondary | ICD-10-CM

## 2021-11-09 DIAGNOSIS — Z87891 Personal history of nicotine dependence: Secondary | ICD-10-CM

## 2021-11-09 DIAGNOSIS — E663 Overweight: Secondary | ICD-10-CM | POA: Diagnosis not present

## 2021-11-09 DIAGNOSIS — Z6825 Body mass index (BMI) 25.0-25.9, adult: Secondary | ICD-10-CM

## 2021-11-09 NOTE — Patient Instructions (Signed)
Health Maintenance, Male Adopting a healthy lifestyle and getting preventive care are important in promoting health and wellness. Ask your health care provider about: The right schedule for you to have regular tests and exams. Things you can do on your own to prevent diseases and keep yourself healthy. What should I know about diet, weight, and exercise? Eat a healthy diet  Eat a diet that includes plenty of vegetables, fruits, low-fat dairy products, and lean protein. Do not eat a lot of foods that are high in solid fats, added sugars, or sodium. Maintain a healthy weight Body mass index (BMI) is a measurement that can be used to identify possible weight problems. It estimates body fat based on height and weight. Your health care provider can help determine your BMI and help you achieve or maintain a healthy weight. Get regular exercise Get regular exercise. This is one of the most important things you can do for your health. Most adults should: Exercise for at least 150 minutes each week. The exercise should increase your heart rate and make you sweat (moderate-intensity exercise). Do strengthening exercises at least twice a week. This is in addition to the moderate-intensity exercise. Spend less time sitting. Even light physical activity can be beneficial. Watch cholesterol and blood lipids Have your blood tested for lipids and cholesterol at 63 years of age, then have this test every 5 years. You may need to have your cholesterol levels checked more often if: Your lipid or cholesterol levels are high. You are older than 63 years of age. You are at high risk for heart disease. What should I know about cancer screening? Many types of cancers can be detected early and may often be prevented. Depending on your health history and family history, you may need to have cancer screening at various ages. This may include screening for: Colorectal cancer. Prostate cancer. Skin cancer. Lung  cancer. What should I know about heart disease, diabetes, and high blood pressure? Blood pressure and heart disease High blood pressure causes heart disease and increases the risk of stroke. This is more likely to develop in people who have high blood pressure readings or are overweight. Talk with your health care provider about your target blood pressure readings. Have your blood pressure checked: Every 3-5 years if you are 18-39 years of age. Every year if you are 40 years old or older. If you are between the ages of 65 and 75 and are a current or former smoker, ask your health care provider if you should have a one-time screening for abdominal aortic aneurysm (AAA). Diabetes Have regular diabetes screenings. This checks your fasting blood sugar level. Have the screening done: Once every three years after age 45 if you are at a normal weight and have a low risk for diabetes. More often and at a younger age if you are overweight or have a high risk for diabetes. What should I know about preventing infection? Hepatitis B If you have a higher risk for hepatitis B, you should be screened for this virus. Talk with your health care provider to find out if you are at risk for hepatitis B infection. Hepatitis C Blood testing is recommended for: Everyone born from 1945 through 1965. Anyone with known risk factors for hepatitis C. Sexually transmitted infections (STIs) You should be screened each year for STIs, including gonorrhea and chlamydia, if: You are sexually active and are younger than 63 years of age. You are older than 63 years of age and your   health care provider tells you that you are at risk for this type of infection. Your sexual activity has changed since you were last screened, and you are at increased risk for chlamydia or gonorrhea. Ask your health care provider if you are at risk. Ask your health care provider about whether you are at high risk for HIV. Your health care provider  may recommend a prescription medicine to help prevent HIV infection. If you choose to take medicine to prevent HIV, you should first get tested for HIV. You should then be tested every 3 months for as long as you are taking the medicine. Follow these instructions at home: Alcohol use Do not drink alcohol if your health care provider tells you not to drink. If you drink alcohol: Limit how much you have to 0-2 drinks a day. Know how much alcohol is in your drink. In the [place] one drink equals one 12 oz bottle of beer (355 mL), one 5 oz glass of wine (148 mL), or one 1 oz glass of hard liquor (44 mL). Lifestyle Do not use any products that contain nicotine or tobacco. These products include cigarettes, chewing tobacco, and vaping devices, such as e-cigarettes. If you need help quitting, ask your health care provider. Do not use street drugs. Do not share needles. Ask your health care provider for help if you need support or information about quitting drugs. General instructions Schedule regular health, dental, and eye exams. Stay current with your vaccines. Tell your health care provider if: You often feel depressed. You have ever been abused or do not feel safe at home. Summary Adopting a healthy lifestyle and getting preventive care are important in promoting health and wellness. Follow your health care provider's instructions about healthy diet, exercising, and getting tested or screened for diseases. Follow your health care provider's instructions on monitoring your cholesterol and blood pressure. This information is not intended to replace advice given to you by your health care provider. Make sure you discuss any questions you have with your health care provider. Document Revised: 09/01/2020 Document Reviewed: 09/01/2020 Elsevier Patient Education  2023 Elsevier Inc.  

## 2021-11-09 NOTE — Assessment & Plan Note (Signed)
Advised patient to go to his local pharmacy for the Shingrix vaccine.

## 2021-11-09 NOTE — Assessment & Plan Note (Signed)
Today's Vitals   11/09/21 1310  BP: 132/73  Pulse: 86  Temp: 98.3 F (36.8 C)  TempSrc: Oral  SpO2: 100%  Weight: 178 lb 9.6 oz (81 kg)  Height: '5\' 6"'$  (1.676 m)  PainSc: 0-No pain   Body mass index is 28.83 kg/m.   BP today was 132/73.  Better controlled with the increase in losartan to 50 mg daily and continued chlorthalidone 50 mg daily. Denies any adverse effects from the increased dose of losartan. Denies chest pain, SOB, headache.  Plan -Continue losartan and chlorthalidone -f/u in 6 months

## 2021-11-09 NOTE — Patient Instructions (Addendum)
Thank you, [name] for allowing [place] to provide your care today. Today we discussed:  Blood Pressure -blood pressure is good today, 132/73. -continue your Losartan and Chlorthalidone  Cholesterol -last blood work was normal -continue Lipitor  Weight Loss -continue your healthy eating and weekly exercises  Shingles Vaccine -ask your local pharmacy to see if they offer the Shingrix vaccine   I have ordered the following labs for you:  Lab Orders  No laboratory test(s) ordered today     Referrals ordered today:   Referral Orders  No referral(s) requested today     I have ordered the following medication/changed the following medications:   Stop the following medications: There are no discontinued medications.   Start the following medications: No orders of the defined types were placed in this encounter.    Follow up: 6 months    Should you have any questions or concerns please call the internal medicine clinic at [phone].    Angelique Blonder, D.O. [place]

## 2021-11-09 NOTE — Assessment & Plan Note (Addendum)
Patient reports exercising at the gym 3 times a week. He tries to eat a balanced meal for breakfast, lunch and dinner. He has lost about 12 lbs since 03/2021. Lipid panel in 09/2021 improved from previous, currently on Lipitor 20 mg daily.   Plan -continue lifestyle modifications

## 2021-11-09 NOTE — Progress Notes (Unsigned)
Subjective:   [name] is a 61 y.o. male who presents for an Initial Medicare Annual Wellness Visit. I connected with  [name] on 11/09/21 by a  IN PERSON [facility]      Patient Location: Other:  IN PERSON [facility]   Provider Location: Office/Clinic  I discussed the limitations of evaluation and management by telemedicine. The patient expressed understanding and agreed to proceed.   Review of Systems    DEFERRED TO PCP        Objective:    There were no vitals filed for this visit. There is no height or weight on file to calculate BMI.     11/09/2021    1:24 PM 04/15/2021    9:32 AM 11/20/2020    2:09 PM 07/31/2020    9:33 AM 01/23/2020    9:20 AM 01/02/2020    1:20 PM 07/16/2019    1:24 PM  Advanced Directives  Does Patient Have a Medical Advance Directive? No No Yes No Yes No No  Type of Advance Directive   Healthcare Power of [place]    Does patient want to make changes to medical advance directive?     No - Patient declined    Copy of [place] in Chart?   No - copy requested  Yes - validated most recent copy scanned in chart (See row information)    Would patient like information on creating a medical advance directive? No - Patient declined No - Patient declined  No - Patient declined  No - Patient declined No - Patient declined    Current Medications (verified) Outpatient Encounter Medications as of 11/09/2021  Medication Sig   atorvastatin (LIPITOR) 20 MG tablet Take 1 tablet (20 mg total) by mouth daily.   benztropine (COGENTIN) 0.5 MG tablet    chlorthalidone (HYGROTON) 50 MG tablet Take 1 tablet (50 mg total) by mouth daily.   gabapentin (NEURONTIN) 300 MG capsule Take 1 capsule (300 mg total) by mouth 3 (three) times daily.   losartan (COZAAR) 50 MG tablet Take 1 tablet (50 mg total) by mouth daily.   OLANZapine (ZYPREXA) 20 MG tablet    perphenazine (TRILAFON) 16 MG tablet Take 1 tablet by mouth at bedtime.    traZODone (DESYREL) 50 MG tablet Take 50 mg by mouth at bedtime.   No facility-administered encounter medications on file as of 11/09/2021.    Allergies (verified) Patient has no known allergies.   History: Past Medical History:  Diagnosis Date   Anemia    Cancer ([place])    lymphoma   Cataract    bilateral   Depression    DISSEMINATED SUPERFICIAL ACTINIC POROKERATOSIS 09/12/2008   Qualifier: Diagnosis of  By: Annamaria Boots MD, Jeremy     Dizziness 12/21/2017   GERD (gastroesophageal reflux disease)    Hemorrhoids    Hepatic cirrhosis ([place]) 11/06/2014   Hepatitis C    History of hepatitis C 03/07/2006   Qualifier: Diagnosis of  By: Conception Chancy MD, Dana     Hypertension    Neuropathy 04/30/2016   Non Hodgkin's lymphoma ([place])    Pulmonary nodule 07/08/2015   Pulmonary nodule 07/08/2015   Rectal bleeding    Schizophrenia ([place])    Umbilical hernia without obstruction or gangrene 07/30/2016   Past Surgical History:  Procedure Laterality Date   COLONOSCOPY     POLYPECTOMY     Family History  Problem Relation Age of Onset   Cancer Mother  Colon cancer Neg Hx    Colon polyps Neg Hx    Esophageal cancer Neg Hx    Rectal cancer Neg Hx    Stomach cancer Neg Hx    Social History   Socioeconomic History   Marital status: Single    Spouse name: Not on file   Number of children: Not on file   Years of education: Not on file   Highest education level: Not on file  Occupational History   Not on file  Tobacco Use   Smoking status: Former    Types: Cigarettes    Quit date: 04/26/1988    Years since quitting: 33.5   Smokeless tobacco: Never  Vaping Use   Vaping Use: Never used  Substance and Sexual Activity   Alcohol use: No    Alcohol/week: 0.0 standard drinks of alcohol   Drug use: No   Sexual activity: Yes  Other Topics Concern   Not on file  Social History Narrative   Not on file   Social Determinants of Health   Financial Resource Strain: Not on file  Food Insecurity: Not on  file  Transportation Needs: Not on file  Physical Activity: Not on file  Stress: Not on file  Social Connections: Not on file    Tobacco Counseling Counseling given: Not Answered   Clinical Intake:                 Diabetic?NO          Activities of Daily Living    11/09/2021    1:19 PM 10/15/2021    2:27 PM  In your present state of health, do you have any difficulty performing the following activities:  Hearing? 0 0  Vision? 1 0  Difficulty concentrating or making decisions? 1 1  Comment  AT TIMES  Walking or climbing stairs? 1 0  Dressing or bathing? 0 0  Doing errands, shopping? 1 1  Comment  AT TIMES    Patient Care Team: Angelique Blonder, DO as PCP - General  Indicate any recent Medical Services you may have received from other than [facility] providers in the past year (date may be approximate).     Assessment:   This is a routine wellness examination for [name].  Hearing/Vision screen No results found.  Dietary issues and exercise activities discussed:     Goals Addressed   None   Depression Screen    11/09/2021    1:15 PM 10/15/2021    2:27 PM 04/15/2021   10:15 AM 11/20/2020    3:35 PM 07/31/2020    9:36 AM 01/02/2020    2:36 PM 07/16/2019    1:24 PM  PHQ 2/9 Scores  PHQ - 2 Score 2 0 '2 4 1 4 3  '$ PHQ- 9 Score '12  8 16 7 11 10    '$ Fall Risk    11/09/2021    1:14 PM 10/15/2021    2:27 PM 04/15/2021    9:32 AM 11/20/2020    2:04 PM 07/31/2020    9:36 AM  Fall Risk   Falls in the past year? 0 0 0 0 0  Number falls in past yr: 0  0 0   Injury with Fall? 0  0 0   Risk for fall due to : No Fall Risks No Fall Risks No Fall Risks    Follow up Falls evaluation completed;Falls prevention discussed Falls evaluation completed Falls evaluation completed Falls prevention discussed     FALL RISK PREVENTION PERTAINING  TO THE HOME:  Any stairs in or around the home? No  If so, are there any without handrails? No  Home free of loose throw rugs in walkways,  pet beds, electrical cords, etc? No  Adequate lighting in your home to reduce risk of falls? No   ASSISTIVE DEVICES UTILIZED TO PREVENT FALLS:  Life alert? No  Use of a cane, walker or w/c? No  Grab bars in the bathroom? No  Shower chair or bench in shower? Yes  Elevated toilet seat or a handicapped toilet? No   TIMED UP AND GO:  Was the test performed? No .  Length of time to ambulate 10 feet: N/A  sec.     Cognitive Function:        Immunizations Immunization History  Administered Date(s) Administered   Hepatitis A, Adult 11/06/2014, 05/08/2015   Hepatitis B, adult 11/06/2014, 01/30/2015, 05/08/2015   Influenza Inj Mdck Quad Pf 02/02/2016   Influenza Split 02/23/2010   Influenza Whole 03/02/2007, 06/18/2009   Influenza,inj,Quad PF,6+ Mos 01/19/2013, 01/17/2014, 01/30/2015, 02/22/2017, 12/21/2017, 01/17/2019, 01/02/2020   PFIZER(Purple Top)SARS-COV-2 Vaccination 07/14/2019, 08/04/2019, 02/12/2020   PNEUMOCOCCAL CONJUGATE-20 04/15/2021   Pneumococcal Polysaccharide-23 11/06/2014   Tdap 01/25/2013    TDAP status: Up to date  Flu Vaccine status: Up to date  Pneumococcal vaccine status: Up to date  Covid-19 vaccine status: Completed vaccines  Qualifies for Shingles Vaccine? Yes   Zostavax completed No   Shingrix Completed?: No.    Education has been provided regarding the importance of this vaccine. Patient has been advised to call insurance company to determine out of pocket expense if they have not yet received this vaccine. Advised may also receive vaccine at local pharmacy or Health Dept. Verbalized acceptance and understanding.  Screening Tests Health Maintenance  Topic Date Due   Zoster Vaccines- Shingrix (1 of 2) Never done   COVID-19 Vaccine (4 - Booster for Pfizer series) 04/08/2020   INFLUENZA VACCINE  11/24/2021   COLONOSCOPY (Pts 45-18yr Insurance coverage will need to be confirmed)  01/09/2022   TETANUS/TDAP  01/26/2023   Hepatitis C Screening   Completed   HIV Screening  Completed   HPV VACCINES  Aged Out    Health Maintenance  Health Maintenance Due  Topic Date Due   Zoster Vaccines- Shingrix (1 of 2) Never done   COVID-19 Vaccine (4 - Booster for Pfizer series) 04/08/2020    Colorectal cancer screening: Type of screening: Colonoscopy. Completed 01/10/2019. Repeat every 3 years  Lung Cancer Screening: (Low Dose CT Chest recommended if Age 63-80years, 30 pack-year currently smoking OR have quit w/in 15years.) does not qualify.   Lung Cancer Screening Referral: DEFERRED TO PCP   Additional Screening:  Hepatitis C Screening: does qualify; Completed 11/24/2017  Vision Screening: Recommended annual ophthalmology exams for early detection of glaucoma and other disorders of the eye. Is the patient up to date with their annual eye exam?  Yes  Who is the provider or what is the name of the office in which the patient attends annual eye exams? [facility] If pt is not established with a provider, would they like to be referred to a provider to establish care? No .   Dental Screening: Recommended annual dental exams for proper oral hygiene  Community Resource Referral / Chronic Care Management: CRR required this visit?  No   CCM required this visit?  No      Plan:     I have personally reviewed  and noted the following in the patient's chart:   Medical and social history Use of alcohol, tobacco or illicit drugs  Current medications and supplements including opioid prescriptions. Patient is currently taking opioid prescriptions. Information provided to patient regarding non-opioid alternatives. Patient advised to discuss non-opioid treatment plan with their provider. Functional ability and status Nutritional status Physical activity Advanced directives List of other physicians Hospitalizations, surgeries, and ER visits in previous 12 months Vitals Screenings to include cognitive, depression, and  falls Referrals and appointments  In addition, I have reviewed and discussed with patient certain preventive protocols, quality metrics, and best practice recommendations. A written personalized care plan for preventive services as well as general preventive health recommendations were provided to patient.     Judyann Munson, CMA   11/09/2021   Nurse Notes: IN PERSON  [facility]    Mr. Otterson , Thank you for taking time to come for your Medicare Wellness Visit. I appreciate your ongoing commitment to your health goals. Please review the following plan we discussed and let me know if I can assist you in the future.   These are the goals we discussed:  Goals      Blood Pressure < 140/90        This is a list of the screening recommended for you and due dates:  Health Maintenance  Topic Date Due   Zoster (Shingles) Vaccine (1 of 2) Never done   COVID-19 Vaccine (4 - Booster for Pfizer series) 04/08/2020   Flu Shot  11/24/2021   Colon Cancer Screening  01/09/2022   Tetanus Vaccine  01/26/2023   Hepatitis C Screening: USPSTF Recommendation to screen - Ages 18-79 yo.  Completed   HIV Screening  Completed   HPV Vaccine  Aged Out

## 2021-11-09 NOTE — Progress Notes (Signed)
CC: HTN follow-up and AWV  HPI:  [name] is a 61 y.o. male living with a history stated below and presents today for hypertension follow-up and AWV. Please see problem based assessment and plan for additional details.  Past Medical History:  Diagnosis Date   Anemia    Cancer ([place])    lymphoma   Cataract    bilateral   Depression    DISSEMINATED SUPERFICIAL ACTINIC POROKERATOSIS 09/12/2008   Qualifier: Diagnosis of  By: Annamaria Boots MD, Jeremy     Dizziness 12/21/2017   GERD (gastroesophageal reflux disease)    Hemorrhoids    Hepatic cirrhosis ([place]) 11/06/2014   Hepatitis C    History of hepatitis C 03/07/2006   Qualifier: Diagnosis of  By: Conception Chancy MD, Dana     Hypertension    Neuropathy 04/30/2016   Non Hodgkin's lymphoma ([place])    Pulmonary nodule 07/08/2015   Pulmonary nodule 07/08/2015   Rectal bleeding    Schizophrenia ([place])    Umbilical hernia without obstruction or gangrene 07/30/2016    Current Outpatient Medications on File Prior to Visit  Medication Sig Dispense Refill   atorvastatin (LIPITOR) 20 MG tablet Take 1 tablet (20 mg total) by mouth daily. 30 tablet 11   benztropine (COGENTIN) 0.5 MG tablet      chlorthalidone (HYGROTON) 50 MG tablet Take 1 tablet (50 mg total) by mouth daily. 90 tablet 3   gabapentin (NEURONTIN) 300 MG capsule Take 1 capsule (300 mg total) by mouth 3 (three) times daily. 90 capsule 3   losartan (COZAAR) 50 MG tablet Take 1 tablet (50 mg total) by mouth daily. 90 tablet 3   OLANZapine (ZYPREXA) 20 MG tablet      perphenazine (TRILAFON) 16 MG tablet Take 1 tablet by mouth at bedtime.     traZODone (DESYREL) 50 MG tablet Take 50 mg by mouth at bedtime.     No current facility-administered medications on file prior to visit.    Review of Systems: ROS negative except for what is noted on the assessment and plan.  Vitals:   11/09/21 1310  BP: 132/73  Pulse: 86  Temp: 98.3 F (36.8 C)  TempSrc: Oral  SpO2: 100%  Weight: 178 lb 9.6 oz (81  kg)  Height: '5\' 6"'$  (1.676 m)    Physical Exam Constitutional:      General: He is not in acute distress.    Appearance: Normal appearance.  HENT:     Head: Normocephalic and atraumatic.  Cardiovascular:     Rate and Rhythm: Normal rate and regular rhythm.  Pulmonary:     Effort: Pulmonary effort is normal.     Breath sounds: Normal breath sounds.  Skin:    General: Skin is warm and dry.  Neurological:     Mental Status: He is alert and oriented to person, place, and time.  Psychiatric:        Behavior: Behavior normal.      Assessment & Plan:   Hypertension Today's Vitals   11/09/21 1310  BP: 132/73  Pulse: 86  Temp: 98.3 F (36.8 C)  TempSrc: Oral  SpO2: 100%  Weight: 178 lb 9.6 oz (81 kg)  Height: '5\' 6"'$  (1.676 m)  PainSc: 0-No pain   Body mass index is 28.83 kg/m.   BP today was 132/73.  Better controlled with the increase in losartan to 50 mg daily and continued chlorthalidone 50 mg daily. Denies any adverse effects from the increased dose of losartan. Denies chest pain,  SOB, headache.  Plan -Continue losartan and chlorthalidone -f/u in 6 months   Overweight (BMI 25.0-29.9) Patient reports exercising at the gym 3 times a week. He tries to eat a balanced meal for breakfast, lunch and dinner. He has lost about 12 lbs since 03/2021. Lipid panel in 09/2021 improved from previous, currently on Lipitor 20 mg daily.   Plan -continue lifestyle modifications  Healthcare maintenance Advised patient to go to his local pharmacy for the Shingrix vaccine.  Schizophrenia, unspecified type [facility]) Patient feels stable on his current medications. He denies any worsening symptoms or recent changes. Takes Cogentin to help with EPS. He is followed by a mental health provider that manages his treatment plan.    Patient seen with Dr. Ronna Polio, D.O. [facility], PGY-1 Phone: [phone] Date 11/09/2021 Time 5:20 PM

## 2021-11-09 NOTE — Assessment & Plan Note (Signed)
Patient feels stable on his current medications. He denies any worsening symptoms or recent changes. Takes Cogentin to help with EPS. He is followed by a mental health provider that manages his treatment plan.

## 2021-11-11 NOTE — Progress Notes (Signed)
Internal Medicine Clinic Attending  Case discussed with the resident.  I reviewed the AWV findings.  I agree with the assessment, diagnosis, and plan of care documented in the AWV note.     

## 2021-11-12 NOTE — Progress Notes (Signed)
Internal Medicine Clinic Attending  I saw and evaluated the patient.  I personally confirmed the key portions of the history and exam documented by Dr. Zheng and I reviewed pertinent patient test results.  The assessment, diagnosis, and plan were formulated together and I agree with the documentation in the resident's note.  

## 2021-11-25 ENCOUNTER — Encounter: Payer: Self-pay | Admitting: Podiatry

## 2021-11-25 ENCOUNTER — Ambulatory Visit (INDEPENDENT_AMBULATORY_CARE_PROVIDER_SITE_OTHER): Payer: Medicare Other | Admitting: Podiatry

## 2021-11-25 DIAGNOSIS — M79675 Pain in left toe(s): Secondary | ICD-10-CM

## 2021-11-25 DIAGNOSIS — M79674 Pain in right toe(s): Secondary | ICD-10-CM

## 2021-11-25 DIAGNOSIS — B351 Tinea unguium: Secondary | ICD-10-CM | POA: Diagnosis not present

## 2021-11-25 DIAGNOSIS — L84 Corns and callosities: Secondary | ICD-10-CM

## 2021-11-25 NOTE — Progress Notes (Signed)
This patient returns to the office for evaluation and treatment of long thick painful nails .  This patient is unable to trim his own nails since the patient cannot reach his feet.  Patient says the nails are painful walking and wearing his shoes.  Patient has painful callus on  fifth toe right foot. He returns for preventive foot care services.  General Appearance  Alert, conversant and in no acute stress.  Vascular  Dorsalis pedis and posterior tibial  pulses are palpable  bilaterally.  Capillary return is within normal limits  bilaterally. Temperature is within normal limits  bilaterally.  Neurologic  Senn-Weinstein monofilament wire test within normal limits  bilaterally. Muscle power within normal limits bilaterally.  Nails Thick disfigured discolored nails with subungual debris  from hallux to fifth toes bilaterally. No evidence of bacterial infection or drainage bilaterally.  Orthopedic  No limitations of motion  feet .  No crepitus or effusions noted.  No bony pathology or digital deformities noted.  Adducto-varus fifth toes  B/L.  Skin  normotropic skin with no porokeratosis noted bilaterally.  No signs of infections or ulcers noted. Clavi 2,5 right foot.  Onychomycosis  Pain in toes right foot  Pain in toes left foot  Listers corn right foot.  Debridement  of nails  1-5  B/L with a nail nipper.  Nails were then filed using a dremel tool with no incidents.  Debride clavi with # 15 blade.  RTC  22month   GGardiner BarefootDPM

## 2022-01-11 ENCOUNTER — Encounter: Payer: Self-pay | Admitting: Gastroenterology

## 2022-01-19 ENCOUNTER — Encounter (HOSPITAL_COMMUNITY): Payer: Self-pay

## 2022-01-19 ENCOUNTER — Ambulatory Visit (HOSPITAL_COMMUNITY)
Admission: RE | Admit: 2022-01-19 | Discharge: 2022-01-19 | Disposition: A | Discharge status: Home / Self Care | Payer: Medicare Other | Source: Ambulatory Visit | Attending: Internal Medicine | Admitting: Internal Medicine

## 2022-01-19 ENCOUNTER — Inpatient Hospital Stay: Payer: Medicare Other | Attending: Internal Medicine

## 2022-01-19 ENCOUNTER — Other Ambulatory Visit: Payer: Self-pay

## 2022-01-19 DIAGNOSIS — K7460 Unspecified cirrhosis of liver: Secondary | ICD-10-CM | POA: Diagnosis not present

## 2022-01-19 DIAGNOSIS — C8332 Diffuse large B-cell lymphoma, intrathoracic lymph nodes: Secondary | ICD-10-CM | POA: Insufficient documentation

## 2022-01-19 DIAGNOSIS — Z8572 Personal history of non-Hodgkin lymphomas: Secondary | ICD-10-CM | POA: Diagnosis not present

## 2022-01-19 DIAGNOSIS — C8590 Non-Hodgkin lymphoma, unspecified, unspecified site: Secondary | ICD-10-CM | POA: Diagnosis not present

## 2022-01-19 DIAGNOSIS — I10 Essential (primary) hypertension: Secondary | ICD-10-CM | POA: Diagnosis not present

## 2022-01-19 DIAGNOSIS — J439 Emphysema, unspecified: Secondary | ICD-10-CM | POA: Diagnosis not present

## 2022-01-19 DIAGNOSIS — D7389 Other diseases of spleen: Secondary | ICD-10-CM | POA: Diagnosis not present

## 2022-01-19 DIAGNOSIS — I700 Atherosclerosis of aorta: Secondary | ICD-10-CM | POA: Diagnosis not present

## 2022-01-19 LAB — CBC WITH DIFFERENTIAL (CANCER CENTER ONLY)
Abs Immature Granulocytes: 0.03 10*3/uL (ref 0.00–0.07)
Basophils Absolute: 0 10*3/uL (ref 0.0–0.1)
Basophils Relative: 1 %
Eosinophils Absolute: 0 10*3/uL (ref 0.0–0.5)
Eosinophils Relative: 0 %
HCT: 39.7 % (ref 39.0–52.0)
Hemoglobin: 12.9 g/dL — ABNORMAL LOW (ref 13.0–17.0)
Immature Granulocytes: 1 %
Lymphocytes Relative: 20 %
Lymphs Abs: 0.9 10*3/uL (ref 0.7–4.0)
MCH: 25.9 pg — ABNORMAL LOW (ref 26.0–34.0)
MCHC: 32.5 g/dL (ref 30.0–36.0)
MCV: 79.7 fL — ABNORMAL LOW (ref 80.0–100.0)
Monocytes Absolute: 0.4 10*3/uL (ref 0.1–1.0)
Monocytes Relative: 9 %
Neutro Abs: 3 10*3/uL (ref 1.7–7.7)
Neutrophils Relative %: 69 %
Platelet Count: 163 10*3/uL (ref 150–400)
RBC: 4.98 MIL/uL (ref 4.22–5.81)
RDW: 12.5 % (ref 11.5–15.5)
WBC Count: 4.3 10*3/uL (ref 4.0–10.5)
nRBC: 0 % (ref 0.0–0.2)

## 2022-01-19 LAB — CMP (CANCER CENTER ONLY)
ALT: 16 U/L (ref 0–44)
AST: 17 U/L (ref 15–41)
Albumin: 4.6 g/dL (ref 3.5–5.0)
Alkaline Phosphatase: 54 U/L (ref 38–126)
Anion gap: 4 — ABNORMAL LOW (ref 5–15)
BUN: 14 mg/dL (ref 8–23)
CO2: 34 mmol/L — ABNORMAL HIGH (ref 22–32)
Calcium: 9.9 mg/dL (ref 8.9–10.3)
Chloride: 101 mmol/L (ref 98–111)
Creatinine: 0.98 mg/dL (ref 0.61–1.24)
GFR, Estimated: >60 mL/min (ref >60)
Glucose, Bld: 114 mg/dL — ABNORMAL HIGH (ref 70–99)
Potassium: 3.4 mmol/L — ABNORMAL LOW (ref 3.5–5.1)
Sodium: 139 mmol/L (ref 135–145)
Total Bilirubin: 0.8 mg/dL (ref 0.3–1.2)
Total Protein: 8.3 g/dL — ABNORMAL HIGH (ref 6.5–8.1)

## 2022-01-19 LAB — LACTATE DEHYDROGENASE: LDH: 107 U/L (ref 98–192)

## 2022-01-19 MED ORDER — SODIUM CHLORIDE (PF) 0.9 % IJ SOLN
INTRAMUSCULAR | Status: AC
  Filled 2022-01-19: qty 50

## 2022-01-19 MED ORDER — IOHEXOL 300 MG/ML  SOLN
100.0000 mL | Freq: Once | INTRAMUSCULAR | Status: AC | PRN
  Administered 2022-01-19: 100 mL via INTRAVENOUS

## 2022-01-21 ENCOUNTER — Other Ambulatory Visit: Payer: Self-pay

## 2022-01-21 ENCOUNTER — Inpatient Hospital Stay (HOSPITAL_BASED_OUTPATIENT_CLINIC_OR_DEPARTMENT_OTHER): Payer: Medicare Other | Admitting: Internal Medicine

## 2022-01-21 VITALS — BP 145/71 | HR 62 | Temp 98.1°F | Resp 15 | Wt 172.2 lb

## 2022-01-21 DIAGNOSIS — C8332 Diffuse large B-cell lymphoma, intrathoracic lymph nodes: Secondary | ICD-10-CM

## 2022-01-21 DIAGNOSIS — I10 Essential (primary) hypertension: Secondary | ICD-10-CM | POA: Diagnosis not present

## 2022-01-21 DIAGNOSIS — Z8572 Personal history of non-Hodgkin lymphomas: Secondary | ICD-10-CM | POA: Diagnosis not present

## 2022-01-21 DIAGNOSIS — J439 Emphysema, unspecified: Secondary | ICD-10-CM | POA: Diagnosis not present

## 2022-01-21 DIAGNOSIS — I700 Atherosclerosis of aorta: Secondary | ICD-10-CM | POA: Diagnosis not present

## 2022-01-21 DIAGNOSIS — K7460 Unspecified cirrhosis of liver: Secondary | ICD-10-CM | POA: Diagnosis not present

## 2022-01-21 NOTE — Progress Notes (Signed)
[place] [phone]   [phone]  OFFICE PROGRESS NOTE  PRINCIPAL DIAGNOSIS: Stage IV non-Hodgkin lymphoma diagnosed in March 2009.   PRIOR THERAPY:  Status post 4 weekly doses of Rituxan during the patient's hospitalization at the intensive care unit at [facility] with respiratory failure. Status post 7 cycles of systemic chemotherapy with CHOP/Rituxan. Last dose was given January 15, 2008.  CURRENT THERAPY: Observation.  INTERVAL HISTORY: [name] 61 y.o. male returns to the clinic today for annual follow-up visit.  The patient is feeling fine today with no concerning complaints.  He denied having any chest pain, shortness of breath, cough or hemoptysis.  He denied having any fever or chills.  He has no nausea, vomiting, diarrhea or constipation.  He has no headache or visual changes.  He intentionally lost few pounds.  The patient is here today for evaluation with repeat CT scan of the chest, abdomen and pelvis for restaging of his disease.  MEDICAL HISTORY: Past Medical History:  Diagnosis Date   Anemia    Cancer ([place])    lymphoma   Cataract    bilateral   Depression    DISSEMINATED SUPERFICIAL ACTINIC POROKERATOSIS 09/12/2008   Qualifier: Diagnosis of  By: Annamaria Boots MD, Jeremy     Dizziness 12/21/2017   GERD (gastroesophageal reflux disease)    Hemorrhoids    Hepatic cirrhosis ([place]) 11/06/2014   Hepatitis C    History of hepatitis C 03/07/2006   Qualifier: Diagnosis of  By: Conception Chancy MD, Dana     Hypertension    Neuropathy 04/30/2016   Non Hodgkin's lymphoma ([place])    Pulmonary nodule 07/08/2015   Pulmonary nodule 07/08/2015   Rectal bleeding    Schizophrenia ([place])    Umbilical hernia without obstruction or gangrene 07/30/2016    ALLERGIES:  has No Known Allergies.  MEDICATIONS:  Current Outpatient Medications  Medication Sig Dispense Refill   atorvastatin (LIPITOR) 20 MG tablet Take 1 tablet (20 mg total) by mouth daily.  30 tablet 11   benztropine (COGENTIN) 0.5 MG tablet      chlorthalidone (HYGROTON) 50 MG tablet Take 1 tablet (50 mg total) by mouth daily. 90 tablet 3   gabapentin (NEURONTIN) 300 MG capsule Take 1 capsule (300 mg total) by mouth 3 (three) times daily. 90 capsule 3   losartan (COZAAR) 50 MG tablet Take 1 tablet (50 mg total) by mouth daily. 90 tablet 3   OLANZapine (ZYPREXA) 20 MG tablet      perphenazine (TRILAFON) 16 MG tablet Take 1 tablet by mouth at bedtime.     traZODone (DESYREL) 50 MG tablet Take 50 mg by mouth at bedtime.     No current facility-administered medications for this visit.    REVIEW OF SYSTEMS:  A comprehensive review of systems was negative.   PHYSICAL EXAMINATION: General appearance: alert, cooperative, and no distress Head: Normocephalic, without obvious abnormality, atraumatic Neck: no adenopathy Lymph nodes: Cervical, supraclavicular, and axillary nodes normal. Resp: clear to auscultation bilaterally Back: symmetric, no curvature. ROM normal. No CVA tenderness. Cardio: regular rate and rhythm, S1, S2 normal, no murmur, click, rub or gallop GI: soft, non-tender; bowel sounds normal; no masses,  no organomegaly Extremities: extremities normal, atraumatic, no cyanosis or edema  ECOG PERFORMANCE STATUS: 0 - Asymptomatic  Blood pressure (!) 145/71, pulse 62, temperature 98.1 F (36.7 C), temperature source Oral, resp. rate 15, weight 172 lb 3.2 oz (78.1 kg), SpO2 100 %.  LABORATORY DATA:  Lab Results  Component Value Date   WBC 4.3 01/19/2022   HGB 12.9 (L) 01/19/2022   HCT 39.7 01/19/2022   MCV 79.7 (L) 01/19/2022   PLT 163 01/19/2022      Chemistry      Component Value Date/Time   NA 139 01/19/2022 1144   NA 142 10/15/2021 1454   NA 142 01/06/2017 0945   K 3.4 (L) 01/19/2022 1144   K 4.5 01/06/2017 0945   CL 101 01/19/2022 1144   CL 107 06/20/2012 1032   CO2 34 (H) 01/19/2022 1144   CO2 26 01/06/2017 0945   BUN 14 01/19/2022 1144   BUN 9  10/15/2021 1454   BUN 12.2 01/06/2017 0945   CREATININE 0.98 01/19/2022 1144   CREATININE 1.2 01/06/2017 0945      Component Value Date/Time   CALCIUM 9.9 01/19/2022 1144   CALCIUM 10.0 01/06/2017 0945   ALKPHOS 54 01/19/2022 1144   ALKPHOS 62 01/06/2017 0945   AST 17 01/19/2022 1144   AST 27 01/06/2017 0945   ALT 16 01/19/2022 1144   ALT 28 01/06/2017 0945   BILITOT 0.8 01/19/2022 1144   BILITOT 0.91 01/06/2017 0945       RADIOGRAPHIC STUDIES: CT Chest W Contrast  Result Date: 01/20/2022 CLINICAL DATA:  Large B-cell lymphoma.  * Tracking Code: BO * EXAM: CT CHEST, ABDOMEN, AND PELVIS WITH CONTRAST TECHNIQUE: Multidetector CT imaging of the chest, abdomen and pelvis was performed following the standard protocol during bolus administration of intravenous contrast. RADIATION DOSE REDUCTION: This exam was performed according to the departmental dose-optimization program which includes automated exposure control, adjustment of the mA and/or kV according to patient size and/or use of iterative reconstruction technique. CONTRAST:  123m OMNIPAQUE IOHEXOL 300 MG/ML  SOLN COMPARISON:  01/21/2020. FINDINGS: CT CHEST FINDINGS Cardiovascular: Atherosclerotic calcification of the aorta. Heart is at the upper limits of normal in size to mildly enlarged. No pericardial effusion. Mediastinum/Nodes: No pathologically enlarged mediastinal, hilar or axillary lymph nodes. Esophagus is unremarkable. Lungs/Pleura: Centrilobular and paraseptal emphysema. Calcified granulomas. Pleuroparenchymal scarring in the lower left hemithorax. No pleural fluid. Airway is unremarkable. Musculoskeletal: Mild degenerative changes in the spine. No worrisome lytic or sclerotic lesions. CT ABDOMEN PELVIS FINDINGS Hepatobiliary: Liver margin is irregular. Liver and gallbladder are otherwise unremarkable. No biliary ductal dilatation. Pancreas: Negative. Spleen: Mild mottled appearance of the spleen with several subcentimeter  low-attenuation areas, similar to 01/21/2020. Contour is slightly nodular. Measures at the upper limits of normal in size, 12.2 cm. Adrenals/Urinary Tract: Adrenal glands and kidneys are unremarkable. Ureters are decompressed. Bladder is grossly unremarkable. Stomach/Bowel: Slight tenting of the ventral wall of the stomach, presumably from previous percutaneous gastrostomy tube placement. Stomach, small bowel, appendix and colon are otherwise unremarkable. Vascular/Lymphatic: Vascular structures are unremarkable. No pathologically enlarged lymph nodes. Reproductive: Prostate is visualized. Other: 2.8 cm minimally hyperdense lesion along the left inguinal fold is unchanged from 01/21/2020, indicative of a benign lesion. Small periumbilical hernia contains fat. Mesenteries and peritoneum are otherwise unremarkable. Musculoskeletal: Degenerative changes in the spine. No worrisome lytic or sclerotic lesions. IMPRESSION: 1. No evidence of recurrent lymphoma. 2. Nodular contour of the spleen with mottled parenchyma, unchanged from 01/21/2020, and in keeping with the provided history of lymphoma. 3. Cirrhosis. 4.  Aortic atherosclerosis (ICD10-I70.0). 5.  Emphysema (ICD10-J43.9). Electronically Signed   By: MLorin PicketM.D.   On: 01/20/2022 16:11   CT Abdomen Pelvis W Contrast  Result Date: 01/20/2022 CLINICAL DATA:  Large B-cell lymphoma.  *  Tracking Code: BO * EXAM: CT CHEST, ABDOMEN, AND PELVIS WITH CONTRAST TECHNIQUE: Multidetector CT imaging of the chest, abdomen and pelvis was performed following the standard protocol during bolus administration of intravenous contrast. RADIATION DOSE REDUCTION: This exam was performed according to the departmental dose-optimization program which includes automated exposure control, adjustment of the mA and/or kV according to patient size and/or use of iterative reconstruction technique. CONTRAST:  168m OMNIPAQUE IOHEXOL 300 MG/ML  SOLN COMPARISON:  01/21/2020. FINDINGS: CT  CHEST FINDINGS Cardiovascular: Atherosclerotic calcification of the aorta. Heart is at the upper limits of normal in size to mildly enlarged. No pericardial effusion. Mediastinum/Nodes: No pathologically enlarged mediastinal, hilar or axillary lymph nodes. Esophagus is unremarkable. Lungs/Pleura: Centrilobular and paraseptal emphysema. Calcified granulomas. Pleuroparenchymal scarring in the lower left hemithorax. No pleural fluid. Airway is unremarkable. Musculoskeletal: Mild degenerative changes in the spine. No worrisome lytic or sclerotic lesions. CT ABDOMEN PELVIS FINDINGS Hepatobiliary: Liver margin is irregular. Liver and gallbladder are otherwise unremarkable. No biliary ductal dilatation. Pancreas: Negative. Spleen: Mild mottled appearance of the spleen with several subcentimeter low-attenuation areas, similar to 01/21/2020. Contour is slightly nodular. Measures at the upper limits of normal in size, 12.2 cm. Adrenals/Urinary Tract: Adrenal glands and kidneys are unremarkable. Ureters are decompressed. Bladder is grossly unremarkable. Stomach/Bowel: Slight tenting of the ventral wall of the stomach, presumably from previous percutaneous gastrostomy tube placement. Stomach, small bowel, appendix and colon are otherwise unremarkable. Vascular/Lymphatic: Vascular structures are unremarkable. No pathologically enlarged lymph nodes. Reproductive: Prostate is visualized. Other: 2.8 cm minimally hyperdense lesion along the left inguinal fold is unchanged from 01/21/2020, indicative of a benign lesion. Small periumbilical hernia contains fat. Mesenteries and peritoneum are otherwise unremarkable. Musculoskeletal: Degenerative changes in the spine. No worrisome lytic or sclerotic lesions. IMPRESSION: 1. No evidence of recurrent lymphoma. 2. Nodular contour of the spleen with mottled parenchyma, unchanged from 01/21/2020, and in keeping with the provided history of lymphoma. 3. Cirrhosis. 4.  Aortic atherosclerosis  (ICD10-I70.0). 5.  Emphysema (ICD10-J43.9). Electronically Signed   By: MLorin PicketM.D.   On: 01/20/2022 16:11     ASSESSMENT AND PLAN: This is a very pleasant 61years old [place] male with history of stage IV non-Hodgkin lymphoma diagnosed in March of 2009 status post systemic chemotherapy and has been observation since September of 2009. The patient has been on observation for the last 14 years and he is doing fine with no concerning complaints. He had repeat CT scan of the chest, abdomen and pelvis performed recently.  I personally and independently reviewed the scan and discussed the result with the patient today. His scan showed no concerning findings for disease recurrence. I recommended for the patient to continue on observation and routine follow-up visit with his primary care physician from now on. I will see the patient on as-needed basis at this point. He was advised to call if he has any concerning issues in the interval. All questions were answered. The patient knows to call the clinic with any problems, questions or concerns. We can certainly see the patient much sooner if necessary.  Disclaimer: This note was dictated with voice recognition software. Similar sounding words can inadvertently be transcribed and may not be corrected upon review.

## 2022-01-25 ENCOUNTER — Encounter: Payer: Self-pay | Admitting: Gastroenterology

## 2022-02-10 ENCOUNTER — Ambulatory Visit (AMBULATORY_SURGERY_CENTER): Payer: Self-pay

## 2022-02-10 VITALS — Ht 66.0 in | Wt 170.0 lb

## 2022-02-10 DIAGNOSIS — Z86010 Personal history of colonic polyps: Secondary | ICD-10-CM

## 2022-02-10 MED ORDER — NA SULFATE-K SULFATE-MG SULF 17.5-3.13-1.6 GM/177ML PO SOLN: 1.0000 | ORAL | 0 refills | Status: DC

## 2022-02-10 NOTE — Progress Notes (Signed)
No egg or soy allergy known to patient  No issues known to pt with past sedation with any surgeries or procedures Patient denies ever being told they had issues or difficulty with intubation  No FH of Malignant Hyperthermia Pt is not on diet pills Pt is not on  home 02  Pt is not on blood thinners  Pt denies issues with constipation  No A fib or A flutter Have any cardiac testing pending--denied Pt instructed to use Single[employer]com or GoodRx for a price reduction on prep   

## 2022-02-19 DIAGNOSIS — J432 Centrilobular emphysema: Secondary | ICD-10-CM | POA: Diagnosis not present

## 2022-02-19 DIAGNOSIS — C8332 Diffuse large B-cell lymphoma, intrathoracic lymph nodes: Secondary | ICD-10-CM | POA: Diagnosis not present

## 2022-02-19 DIAGNOSIS — I10 Essential (primary) hypertension: Secondary | ICD-10-CM | POA: Diagnosis not present

## 2022-02-19 DIAGNOSIS — Z1159 Encounter for screening for other viral diseases: Secondary | ICD-10-CM | POA: Diagnosis not present

## 2022-02-19 DIAGNOSIS — I700 Atherosclerosis of aorta: Secondary | ICD-10-CM | POA: Diagnosis not present

## 2022-02-19 DIAGNOSIS — Z79899 Other long term (current) drug therapy: Secondary | ICD-10-CM | POA: Diagnosis not present

## 2022-02-19 DIAGNOSIS — E785 Hyperlipidemia, unspecified: Secondary | ICD-10-CM | POA: Diagnosis not present

## 2022-02-19 DIAGNOSIS — J438 Other emphysema: Secondary | ICD-10-CM | POA: Diagnosis not present

## 2022-02-19 DIAGNOSIS — C8590 Non-Hodgkin lymphoma, unspecified, unspecified site: Secondary | ICD-10-CM | POA: Diagnosis not present

## 2022-02-21 ENCOUNTER — Ambulatory Visit
Admission: EM | Admit: 2022-02-21 | Discharge: 2022-02-21 | Disposition: A | Discharge status: Home / Self Care | Payer: Medicare Other | Attending: Emergency Medicine | Admitting: Emergency Medicine

## 2022-02-21 DIAGNOSIS — Z20822 Contact with and (suspected) exposure to covid-19: Secondary | ICD-10-CM | POA: Insufficient documentation

## 2022-02-21 NOTE — ED Triage Notes (Signed)
Pt presents with intermittent cough X 1 week; pt states sister tested positive for covid X 2 days ago.

## 2022-02-21 NOTE — Discharge Instructions (Signed)
You received a COVID-19 PCR test today.  The result of your COVID-19 test will be posted to your MyChart once it is complete, typically this takes 24 to 36 hours.     If your COVID-19 PCR test is positive, you will be contacted by phone.  Please discuss with the callback nurse whether or not you would benefit from antiviral therapy treatment for COVID-19.     If your COVID-19 PCR test is negative,  CDC guidelines recommend that you take a second COVID-19 test 48 hours after the first test, which would be Tuesday morning to confirm that you do not have COVID-19.    If your second COVID-19 test, whether it is a PCR or antigen home test, is negative, then you can rest assured that you do not have COVID-19.  Thank you for visiting urgent care today.

## 2022-02-21 NOTE — ED Provider Notes (Signed)
[facility]    CSN: [id number] Arrival date & time: 02/21/22  1143    HISTORY   Chief Complaint  Patient presents with   Covid Exposure    HPI [name] is a pleasant, 61 y.o. male who presents to urgent care today. Pt presents with intermittent cough X 1 week; pt states his sister, with whom he lives, tested positive for covid X 2 days ago.  Patient reports feeling otherwise well.  Patient is requesting testing for COVID-19.  The history is provided by the patient.  Cough Cough characteristics:  Non-productive Severity:  Mild Onset quality:  Gradual Duration:  1 week Timing:  Intermittent Progression:  Waxing and waning Chronicity:  New Smoker: no   Context: sick contacts and with activity   Context: not animal exposure, not exposure to allergens, not occupational exposure, not smoke exposure and not weather changes   Relieved by:  None tried Worsened by:  Deep breathing, activity and lying down Ineffective treatments:  None tried Associated symptoms: myalgias, rhinorrhea and sinus congestion   Associated symptoms: no chest pain, no chills, no diaphoresis, no ear fullness, no ear pain, no eye discharge, no fever, no headaches, no rash, no shortness of breath, no sore throat, no weight loss and no wheezing   Myalgias:    Location:  Generalized   Quality:  Aching   Severity:  Mild   Onset quality:  Gradual   Timing:  Sporadic   Progression:  Unable to specify Risk factors: no recent travel    Past Medical History:  Diagnosis Date   Anemia    Cancer ([place])    lymphoma   Cataract    bilateral   Depression    DISSEMINATED SUPERFICIAL ACTINIC POROKERATOSIS 09/12/2008   Qualifier: Diagnosis of  By: Annamaria Boots MD, Jeremy     Dizziness 12/21/2017   GERD (gastroesophageal reflux disease)    Hemorrhoids    Hepatic cirrhosis ([place]) 11/06/2014   Hepatitis C    History of hepatitis C 03/07/2006   Qualifier: Diagnosis of  By: Conception Chancy MD, Dana     Hypertension     Neuropathy 04/30/2016   Non Hodgkin's lymphoma ([place])    Pulmonary nodule 07/08/2015   Pulmonary nodule 07/08/2015   Rectal bleeding    Schizophrenia ([place])    Umbilical hernia without obstruction or gangrene 07/30/2016   Patient Active Problem List   Diagnosis Date Noted   Corns and callosities 08/12/2021   Hyperlipidemia 04/15/2021   Need for pneumococcal vaccination 04/15/2021   Onychomycosis 07/31/2020   Fatigue 04/29/2017   Diffuse large B-cell lymphoma of intrathoracic lymph nodes ([place]) 01/12/2016   Overweight (BMI 25.0-29.9) 08/14/2015   Healthcare maintenance 09/24/2014   Erectile dysfunction 08/11/2010   NHL (non-Hodgkin's lymphoma) ([place]) 01/26/2008   Schizophrenia, unspecified type ([place]) 03/07/2006   Hypertension 03/07/2006   Past Surgical History:  Procedure Laterality Date   COLONOSCOPY     POLYPECTOMY      Home Medications    Prior to Admission medications   Medication Sig Start Date End Date Taking? Authorizing Provider  atorvastatin (LIPITOR) 20 MG tablet Take 1 tablet (20 mg total) by mouth daily. 04/16/21 04/16/22  Farrel Gordon, DO  benztropine (COGENTIN) 0.5 MG tablet  08/14/18   [provider]  chlorthalidone (HYGROTON) 50 MG tablet Take 1 tablet (50 mg total) by mouth daily. 09/23/21   Demaio, Alexa, MD  gabapentin (NEURONTIN) 300 MG capsule Take 1 capsule (300 mg total) by mouth 3 (three) times daily. 07/24/18  [name], MD  losartan (COZAAR) 25 MG tablet Take 25 mg by mouth daily. 01/14/22   [provider]  Na Sulfate-K Sulfate-Mg Sulf 17.5-3.13-1.6 GM/177ML SOLN Take 1 kit by mouth as directed. May use generic Suprep, no prior authorization. Take as directed. 02/10/22   Mansouraty, Telford Nab., MD  OLANZapine (ZYPREXA) 20 MG tablet  12/26/18   [provider]  perphenazine (TRILAFON) 16 MG tablet Take 1 tablet by mouth at bedtime. 12/19/19   [provider]    Family History Family History  Problem Relation Age of Onset    Stomach cancer Mother    Cancer Mother    Esophageal cancer Maternal Aunt    Colon cancer Neg Hx    Colon polyps Neg Hx    Rectal cancer Neg Hx    Social History Social History   Tobacco Use   Smoking status: Former    Types: Cigarettes    Quit date: 04/26/1988    Years since quitting: 33.8   Smokeless tobacco: Never  Vaping Use   Vaping Use: Never used  Substance Use Topics   Alcohol use: No    Alcohol/week: 0.0 standard drinks of alcohol   Drug use: No   Allergies   Patient has no known allergies.  Review of Systems Review of Systems  Constitutional:  Negative for chills, diaphoresis, fever and weight loss.  HENT:  Positive for rhinorrhea. Negative for ear pain and sore throat.   Eyes:  Negative for discharge.  Respiratory:  Positive for cough. Negative for shortness of breath and wheezing.   Cardiovascular:  Negative for chest pain.  Musculoskeletal:  Positive for myalgias.  Skin:  Negative for rash.  Neurological:  Negative for headaches.   Pertinent findings revealed after performing a 14 point review of systems has been noted in the history of present illness.  Physical Exam Triage Vital Signs ED Triage Vitals  Enc Vitals Group     BP 02/20/21 0827 (!) 147/82     Pulse Rate 02/20/21 0827 72     Resp 02/20/21 0827 18     Temp 02/20/21 0827 98.3 F (36.8 C)     Temp Source 02/20/21 0827 Oral     SpO2 02/20/21 0827 98 %     Weight --      Height --      Head Circumference --      Peak Flow --      Pain Score 02/20/21 0826 5     Pain Loc --      Pain Edu? --      Excl. in [place]? --   No data found.  Updated Vital Signs BP 123/76 (BP Location: Left Arm)   Pulse 73   Temp 98 F (36.7 C) (Oral)   Resp 17   SpO2 93%   Physical Exam Vitals and nursing note reviewed.  Constitutional:      General: He is not in acute distress.    Appearance: Normal appearance. He is not ill-appearing.  HENT:     Head: Normocephalic and atraumatic.     Salivary  Glands: Right salivary gland is not diffusely enlarged or tender. Left salivary gland is not diffusely enlarged or tender.     Right Ear: Tympanic membrane, ear canal and external ear normal. No drainage. No middle ear effusion. There is no impacted cerumen. Tympanic membrane is not erythematous or bulging.     Left Ear: Tympanic membrane, ear canal and external ear normal. No drainage.  No middle ear effusion. There is no impacted cerumen. Tympanic membrane is not erythematous or bulging.     Nose: Nose normal. No nasal deformity, septal deviation, mucosal edema, congestion or rhinorrhea.     Right Turbinates: Not enlarged, swollen or pale.     Left Turbinates: Not enlarged, swollen or pale.     Right Sinus: No maxillary sinus tenderness or frontal sinus tenderness.     Left Sinus: No maxillary sinus tenderness or frontal sinus tenderness.     Mouth/Throat:     Lips: Pink. No lesions.     Mouth: Mucous membranes are moist. No oral lesions.     Pharynx: Oropharynx is clear. Uvula midline. No posterior oropharyngeal erythema or uvula swelling.     Tonsils: No tonsillar exudate. 0 on the right. 0 on the left.  Eyes:     General: Lids are normal.        Right eye: No discharge.        Left eye: No discharge.     Extraocular Movements: Extraocular movements intact.     Conjunctiva/sclera: Conjunctivae normal.     Right eye: Right conjunctiva is not injected.     Left eye: Left conjunctiva is not injected.  Neck:     Trachea: Trachea and phonation normal.  Cardiovascular:     Rate and Rhythm: Normal rate and regular rhythm.     Pulses: Normal pulses.     Heart sounds: Normal heart sounds. No murmur heard.    No friction rub. No gallop.  Pulmonary:     Effort: Pulmonary effort is normal. No accessory muscle usage, prolonged expiration or respiratory distress.     Breath sounds: Normal breath sounds. No stridor, decreased air movement or transmitted upper airway sounds. No decreased breath  sounds, wheezing, rhonchi or rales.  Chest:     Chest wall: No tenderness.  Musculoskeletal:        General: Normal range of motion.     Cervical back: Normal range of motion and neck supple. Normal range of motion.  Lymphadenopathy:     Cervical: No cervical adenopathy.  Skin:    General: Skin is warm and dry.     Findings: No erythema or rash.  Neurological:     General: No focal deficit present.     Mental Status: He is alert and oriented to person, place, and time.  Psychiatric:        Mood and Affect: Mood normal.        Behavior: Behavior normal.     Visual Acuity Right Eye Distance:   Left Eye Distance:   Bilateral Distance:    Right Eye Near:   Left Eye Near:    Bilateral Near:     UC Couse / Diagnostics / Procedures:     Radiology No results found.  Procedures Procedures (including critical care time) EKG  Pending results:  Labs Reviewed  SARS CORONAVIRUS 2 (TAT 6-24 HRS) - Abnormal; Notable for the following components:      Result Value   SARS Coronavirus 2 POSITIVE (*)    All other components within normal limits    Medications Ordered in UC: Medications - No data to display  UC Diagnoses / Final Clinical Impressions(s)   I have reviewed the triage vital signs and the nursing notes.  Pertinent labs & imaging results that were available during my care of the patient were reviewed by me and considered in my medical decision making (see chart for details).  Final diagnoses:  Exposure to COVID-19 virus   COVID-19 testing performed due to patient's known exposure to COVID-19.  Patient has a history of hypertension and hyperlipidemia.  Patient does take losartan, patient advised to follow-up with his primary care provider if intermittent cough does not resolve.  Patient would benefit from Paxlovid if his COVID-19 test is positive, last GFR was checked January 19, 2022 and was greater than 60.  ED Prescriptions   None    PDMP not reviewed this  encounter.  Disposition Upon Discharge:  Condition: stable for discharge home Home: take medications as prescribed; routine discharge instructions as discussed; follow up as advised.  Patient presented with an acute illness with associated systemic symptoms and significant discomfort requiring urgent management. In my opinion, this is a condition that a prudent lay person (someone who possesses an average knowledge of health and medicine) may potentially expect to result in complications if not addressed urgently such as respiratory distress, impairment of bodily function or dysfunction of bodily organs.   Routine symptom specific, illness specific and/or disease specific instructions were discussed with the patient and/or caregiver at length.   As such, the patient has been evaluated and assessed, work-up was performed and treatment was provided in alignment with urgent care protocols and evidence based medicine.  Patient/parent/caregiver has been advised that the patient may require follow up for further testing and treatment if the symptoms continue in spite of treatment, as clinically indicated and appropriate.  If the patient was tested for COVID-19, Influenza and/or RSV, then the patient/parent/guardian was advised to isolate at home pending the results of his/her diagnostic coronavirus test and potentially longer if they're positive. I have also advised pt that if his/her COVID-19 test returns positive, it's recommended to self-isolate for at least 10 days after symptoms first appeared AND until fever-free for 24 hours without fever reducer AND other symptoms have improved or resolved. Discussed self-isolation recommendations as well as instructions for household member/close contacts as per the [facility] and [place] DHHS, and also gave patient the [facility] packet with this information.  Patient/parent/caregiver has been advised to return to the [facility] or PCP in 3-5 days if no better; to PCP or the Emergency  Department if new signs and symptoms develop, or if the current signs or symptoms continue to change or worsen for further workup, evaluation and treatment as clinically indicated and appropriate  The patient will follow up with their current PCP if and as advised. If the patient does not currently have a PCP we will assist them in obtaining one.   The patient may need specialty follow up if the symptoms continue, in spite of conservative treatment and management, for further workup, evaluation, consultation and treatment as clinically indicated and appropriate.  Patient/parent/caregiver verbalized understanding and agreement of plan as discussed.  All questions were addressed during visit.  Please see discharge instructions below for further details of plan.  Discharge Instructions:   Discharge Instructions      You received a COVID-19 PCR test today.  The result of your COVID-19 test will be posted to your MyChart once it is complete, typically this takes 24 to 36 hours.     If your COVID-19 PCR test is positive, you will be contacted by phone.  Please discuss with the callback nurse whether or not you would benefit from antiviral therapy treatment for COVID-19.     If your COVID-19 PCR test is negative,  CDC guidelines recommend that you take a second COVID-19 test  48 hours after the first test, which would be Tuesday morning to confirm that you do not have COVID-19.    If your second COVID-19 test, whether it is a PCR or antigen home test, is negative, then you can rest assured that you do not have COVID-19.  Thank you for visiting urgent care today.             This office note has been dictated using Museum/gallery curator.  Unfortunately, this method of dictation can sometimes lead to typographical or grammatical errors.  I apologize for your inconvenience in advance if this occurs.  Please do not hesitate to reach out to me if clarification is needed.       Lynden Oxford Scales, [place] 02/22/22 [phone]

## 2022-02-22 ENCOUNTER — Telehealth (HOSPITAL_COMMUNITY): Payer: Self-pay | Admitting: Emergency Medicine

## 2022-02-22 LAB — SARS CORONAVIRUS 2 (TAT 6-24 HRS): SARS Coronavirus 2: POSITIVE — AB

## 2022-02-22 MED ORDER — NIRMATRELVIR/RITONAVIR (PAXLOVID)TABLET: 3.0000 | ORAL_TABLET | Freq: Two times a day (BID) | ORAL | 0 refills | Status: AC

## 2022-02-24 ENCOUNTER — Encounter: Payer: Medicare Other | Admitting: Gastroenterology

## 2022-03-03 ENCOUNTER — Ambulatory Visit (INDEPENDENT_AMBULATORY_CARE_PROVIDER_SITE_OTHER): Payer: Medicare Other | Admitting: Podiatry

## 2022-03-03 ENCOUNTER — Other Ambulatory Visit: Payer: Self-pay | Admitting: Internal Medicine

## 2022-03-03 ENCOUNTER — Encounter: Payer: Self-pay | Admitting: Podiatry

## 2022-03-03 DIAGNOSIS — M79674 Pain in right toe(s): Secondary | ICD-10-CM

## 2022-03-03 DIAGNOSIS — M79675 Pain in left toe(s): Secondary | ICD-10-CM

## 2022-03-03 DIAGNOSIS — B351 Tinea unguium: Secondary | ICD-10-CM | POA: Diagnosis not present

## 2022-03-03 DIAGNOSIS — L84 Corns and callosities: Secondary | ICD-10-CM | POA: Diagnosis not present

## 2022-03-03 NOTE — Progress Notes (Signed)
This patient returns to the office for evaluation and treatment of long thick painful nails .  This patient is unable to trim his own nails since the patient cannot reach his feet.  Patient says the nails are painful walking and wearing his shoes.  Patient has painful callus on  fifth toe right foot. He returns for preventive foot care services.  General Appearance  Alert, conversant and in no acute stress.  Vascular  Dorsalis pedis and posterior tibial  pulses are palpable  bilaterally.  Capillary return is within normal limits  bilaterally. Temperature is within normal limits  bilaterally.  Neurologic  Senn-Weinstein monofilament wire test within normal limits  bilaterally. Muscle power within normal limits bilaterally.  Nails Thick disfigured discolored nails with subungual debris  from hallux to fifth toes bilaterally. No evidence of bacterial infection or drainage bilaterally.  Orthopedic  No limitations of motion  feet .  No crepitus or effusions noted.  No bony pathology or digital deformities noted.  Adducto-varus fifth toes  B/L.  Skin  normotropic skin with no porokeratosis noted bilaterally.  No signs of infections or ulcers noted. Clavi 2,5 right foot.  Onychomycosis  Pain in toes right foot  Pain in toes left foot  Listers corn B/L.  Debridement  of nails  1-5  B/L with a nail nipper.  Nails were then filed using a dremel tool with no incidents.    RTC  3 months   Gardiner Barefoot DPM

## 2022-04-07 ENCOUNTER — Ambulatory Visit (AMBULATORY_SURGERY_CENTER): Payer: Medicare Other | Admitting: Gastroenterology

## 2022-04-07 ENCOUNTER — Encounter: Payer: Self-pay | Admitting: Gastroenterology

## 2022-04-07 VITALS — BP 113/66 | HR 64 | Temp 98.0°F | Resp 13 | Ht 66.0 in | Wt 170.0 lb

## 2022-04-07 DIAGNOSIS — Z09 Encounter for follow-up examination after completed treatment for conditions other than malignant neoplasm: Secondary | ICD-10-CM

## 2022-04-07 DIAGNOSIS — Z86010 Personal history of colonic polyps: Secondary | ICD-10-CM

## 2022-04-07 MED ORDER — SODIUM CHLORIDE 0.9 % IV SOLN: 500.0000 mL | INTRAVENOUS | Status: DC

## 2022-04-07 NOTE — Progress Notes (Unsigned)
Report to PACU, RN, vss, BBS= Clear.  

## 2022-04-07 NOTE — Progress Notes (Unsigned)
GASTROENTEROLOGY PROCEDURE H&P NOTE   Primary Care Physician: Angelique Blonder, DO  HPI: [name] is a 61 y.o. male who presents for Colonoscopy for surveillance of previous advanced adenoma and TAs.  Past Medical History:  Diagnosis Date   Anemia    Cancer ([place])    lymphoma   Cataract    bilateral   Depression    DISSEMINATED SUPERFICIAL ACTINIC POROKERATOSIS 09/12/2008   Qualifier: Diagnosis of  By: Annamaria Boots MD, Jeremy     Dizziness 12/21/2017   GERD (gastroesophageal reflux disease)    Hemorrhoids    Hepatic cirrhosis ([place]) 11/06/2014   Hepatitis C    History of hepatitis C 03/07/2006   Qualifier: Diagnosis of  By: Conception Chancy MD, Dana     Hypertension    Neuropathy 04/30/2016   Non Hodgkin's lymphoma ([place])    Pulmonary nodule 07/08/2015   Pulmonary nodule 07/08/2015   Rectal bleeding    Schizophrenia ([place])    Umbilical hernia without obstruction or gangrene 07/30/2016   Past Surgical History:  Procedure Laterality Date   COLONOSCOPY     POLYPECTOMY     Current Outpatient Medications  Medication Sig Dispense Refill   atorvastatin (LIPITOR) 20 MG tablet Take 1 tablet (20 mg total) by mouth daily. 100 tablet 3   benztropine (COGENTIN) 0.5 MG tablet      chlorthalidone (HYGROTON) 50 MG tablet Take 1 tablet (50 mg total) by mouth daily. 90 tablet 3   gabapentin (NEURONTIN) 300 MG capsule Take 1 capsule (300 mg total) by mouth 3 (three) times daily. 90 capsule 3   losartan (COZAAR) 25 MG tablet Take 25 mg by mouth daily.     OLANZapine (ZYPREXA) 20 MG tablet      perphenazine (TRILAFON) 16 MG tablet Take 1 tablet by mouth at bedtime.     Current Facility-Administered Medications  Medication Dose Route Frequency Provider Last Rate Last Admin   0.9 %  sodium chloride infusion  500 mL Intravenous Continuous Mansouraty, Telford Nab., MD        Current Outpatient Medications:    atorvastatin (LIPITOR) 20 MG tablet, Take 1 tablet (20 mg total) by mouth daily., Disp: 100 tablet, Rfl:  3   benztropine (COGENTIN) 0.5 MG tablet, , Disp: , Rfl:    chlorthalidone (HYGROTON) 50 MG tablet, Take 1 tablet (50 mg total) by mouth daily., Disp: 90 tablet, Rfl: 3   gabapentin (NEURONTIN) 300 MG capsule, Take 1 capsule (300 mg total) by mouth 3 (three) times daily., Disp: 90 capsule, Rfl: 3   losartan (COZAAR) 25 MG tablet, Take 25 mg by mouth daily., Disp: , Rfl:    OLANZapine (ZYPREXA) 20 MG tablet, , Disp: , Rfl:    perphenazine (TRILAFON) 16 MG tablet, Take 1 tablet by mouth at bedtime., Disp: , Rfl:   Current Facility-Administered Medications:    0.9 %  sodium chloride infusion, 500 mL, Intravenous, Continuous, Mansouraty, Telford Nab., MD No Known Allergies Family History  Problem Relation Age of Onset   Stomach cancer Mother    Cancer Mother    Esophageal cancer Maternal Aunt    Colon cancer Neg Hx    Colon polyps Neg Hx    Rectal cancer Neg Hx    Social History   Socioeconomic History   Marital status: Single    Spouse name: Not on file   Number of children: Not on file   Years of education: Not on file   Highest education level: Not on file  Occupational History  Not on file  Tobacco Use   Smoking status: Former    Types: Cigarettes    Quit date: 04/26/1988    Years since quitting: 33.9   Smokeless tobacco: Never  Vaping Use   Vaping Use: Never used  Substance and Sexual Activity   Alcohol use: No    Alcohol/week: 0.0 standard drinks of alcohol   Drug use: No   Sexual activity: Yes  Other Topics Concern   Not on file  Social History Narrative   Not on file   Social Determinants of Health   Financial Resource Strain: Low Risk  (11/09/2021)   Overall Financial Resource Strain (CARDIA)    Difficulty of Paying Living Expenses: Not hard at all  Food Insecurity: No Food Insecurity (11/09/2021)   Hunger Vital Sign    Worried About Running Out of Food in the Last Year: Never true    Ran Out of Food in the Last Year: Never true  Transportation Needs: No  Transportation Needs (11/09/2021)   PRAPARE - Hydrologist (Medical): No    Lack of Transportation (Non-Medical): No  Physical Activity: Sufficiently Active (11/09/2021)   Exercise Vital Sign    Days of Exercise per Week: 5 days    Minutes of Exercise per Session: 70 min  Stress: Stress Concern Present (11/09/2021)   [place]    Feeling of Stress : To some extent  Social Connections: Socially Isolated (11/09/2021)   Social Connection and Isolation Panel [NHANES]    Frequency of Communication with Friends and Family: More than three times a week    Frequency of Social Gatherings with Friends and Family: Twice a week    Attends Religious Services: Never    Marine scientist or Organizations: No    Attends Archivist Meetings: Never    Marital Status: Never married  Intimate Partner Violence: Not At Risk (11/09/2021)   Humiliation, Afraid, Rape, and Kick questionnaire    Fear of Current or Ex-Partner: No    Emotionally Abused: No    Physically Abused: No    Sexually Abused: No    Physical Exam: Today's Vitals   04/07/22 0803 04/07/22 0804 04/07/22 0859  BP: (!) 148/85  (!) 142/76  Pulse: 100  70  Resp:   (!) 23  Temp: 98 F (36.7 C) 98 F (36.7 C)   SpO2: 97%  99%  Weight: 170 lb (77.1 kg)    Height: '5\' 6"'$  (1.676 m)     Body mass index is 27.44 kg/m. GEN: NAD EYE: Sclerae anicteric ENT: MMM CV: Non-tachycardic GI: Soft, NT/ND NEURO:  Alert & Oriented x 3  Lab Results: No results for input(s): "WBC", "HGB", "HCT", "PLT" in the last 72 hours. BMET No results for input(s): "NA", "K", "CL", "CO2", "GLUCOSE", "BUN", "CREATININE", "CALCIUM" in the last 72 hours. LFT No results for input(s): "PROT", "ALBUMIN", "AST", "ALT", "ALKPHOS", "BILITOT", "BILIDIR", "IBILI" in the last 72 hours. PT/INR No results for input(s): "LABPROT", "INR" in the last 72  hours.   Impression / Plan: This is a 61 y.o.male who presents for Colonoscopy for surveillance of previous advanced adenoma and TAs.  The risks and benefits of endoscopic evaluation/treatment were discussed with the patient and/or family; these include but are not limited to the risk of perforation, infection, bleeding, missed lesions, lack of diagnosis, severe illness requiring hospitalization, as well as anesthesia and sedation related illnesses.  The patient's history has  been reviewed, patient examined, no change in status, and deemed stable for procedure.  The patient and/or family is agreeable to proceed.    Justice Britain, MD [facility] Office # [phone]

## 2022-04-07 NOTE — Op Note (Signed)
[facility] Patient Name: [name] Procedure Date: 04/07/2022 8:52 AM MRN: [id number] Endoscopist: Justice Britain , MD, [id number] Age: 61 Referring MD:  Date of Birth: 15-Sep-1960 Gender: Male Account #: [id number] Procedure:                Colonoscopy Indications:              Surveillance: Personal history of adenomatous                            polyps on last colonoscopy 3 years ago (history of                            advanced adenoma) Medicines:                Monitored Anesthesia Care Procedure:                Pre-Anesthesia Assessment:                           - Prior to the procedure, a History and Physical                            was performed, and patient medications and                            allergies were reviewed. The patient's tolerance of                            previous anesthesia was also reviewed. The risks                            and benefits of the procedure and the sedation                            options and risks were discussed with the patient.                            All questions were answered, and informed consent                            was obtained. Prior Anticoagulants: The patient has                            taken no anticoagulant or antiplatelet agents. ASA                            Grade Assessment: II - A patient with mild systemic                            disease. After reviewing the risks and benefits,                            the patient was deemed in satisfactory condition to  undergo the procedure.                           After obtaining informed consent, the colonoscope                            was passed under direct vision. Throughout the                            procedure, the patient's blood pressure, pulse, and                            oxygen saturations were monitored continuously. The                            CF [id number] #[id number] was introduced through  the anus                            and advanced to the 5 cm into the ileum. The                            colonoscopy was performed without difficulty. The                            patient tolerated the procedure. The quality of the                            bowel preparation was adequate. The terminal ileum,                            ileocecal valve, appendiceal orifice, and rectum                            were photographed. Scope In: 9:14:42 AM Scope Out: 9:26:36 AM Scope Withdrawal Time: 0 hours 8 minutes 10 seconds  Total Procedure Duration: 0 hours 11 minutes 54 seconds  Findings:                 The digital rectal exam findings include                            hemorrhoids. Pertinent negatives include no                            palpable rectal lesions.                           The terminal ileum and ileocecal valve appeared                            normal.                           A tattoo was seen at the splenic flexure region.  The tattoo site appeared normal. No evidence of any                            recurrent tissue present.                           Multiple small-mouthed diverticula were found in                            the recto-sigmoid colon and sigmoid colon.                           Normal mucosa was found in the entire colon                            otherwise.                           Non-bleeding non-thrombosed external and internal                            hemorrhoids were found during retroflexion, during                            perianal exam and during digital exam. The                            hemorrhoids were Grade II (internal hemorrhoids                            that prolapse but reduce spontaneously). Complications:            No immediate complications. Estimated Blood Loss:     Estimated blood loss was minimal. Impression:               - Hemorrhoids found on digital rectal exam.                            - The examined portion of the ileum was normal.                           - A tattoo was seen at the splenic flexure. The                            tattoo site appeared normal and no evidence of any                            recurrence.                           - Diverticulosis in the recto-sigmoid colon and in                            the sigmoid colon.                           -  Normal mucosa in the entire examined colon                            otherwise.                           - Non-bleeding non-thrombosed external and internal                            hemorrhoids. Recommendation:           - The patient will be observed post-procedure,                            until all discharge criteria are met.                           - Discharge patient to home.                           - Patient has a contact number available for                            emergencies. The signs and symptoms of potential                            delayed complications were discussed with the                            patient. Return to normal activities tomorrow.                            Written discharge instructions were provided to the                            patient.                           - High fiber diet.                           - Use FiberCon 1-2 tablets PO daily.                           - Continue present medications.                           - Repeat colonoscopy in 5 years for surveillance                            due to previous history of advanced adenoma.                           - The findings and recommendations were discussed                            with the patient.                           -  The findings and recommendations were discussed                            with the patient's family. Justice Britain, MD 04/07/2022 9:31:56 AM

## 2022-04-07 NOTE — Patient Instructions (Signed)
Please read handouts provided. Continue present medications. Repeat colonoscopy in 5 years. High Fiber Diet. FiberCon 1-2 tablets daily.   YOU HAD AN ENDOSCOPIC PROCEDURE TODAY AT [facility]:   Refer to the procedure report that was given to you for any specific questions about what was found during the examination.  If the procedure report does not answer your questions, please call your gastroenterologist to clarify.  If you requested that your care partner not be given the details of your procedure findings, then the procedure report has been included in a sealed envelope for you to review at your convenience later.  YOU SHOULD EXPECT: Some feelings of bloating in the abdomen. Passage of more gas than usual.  Walking can help get rid of the air that was put into your GI tract during the procedure and reduce the bloating. If you had a lower endoscopy (such as a colonoscopy or flexible sigmoidoscopy) you may notice spotting of blood in your stool or on the toilet paper. If you underwent a bowel prep for your procedure, you may not have a normal bowel movement for a few days.  Please Note:  You might notice some irritation and congestion in your nose or some drainage.  This is from the oxygen used during your procedure.  There is no need for concern and it should clear up in a day or so.  SYMPTOMS TO REPORT IMMEDIATELY:  Following lower endoscopy (colonoscopy or flexible sigmoidoscopy):  Excessive amounts of blood in the stool  Significant tenderness or worsening of abdominal pains  Swelling of the abdomen that is new, acute  Fever of 100F or higher.  For urgent or emergent issues, a gastroenterologist can be reached at any hour by calling [phone]. Do not use MyChart messaging for urgent concerns.    DIET:  We do recommend a small meal at first, but then you may proceed to your regular diet.  Drink plenty of fluids but you should avoid alcoholic beverages for 24  hours.  ACTIVITY:  You should plan to take it easy for the rest of today and you should NOT DRIVE or use heavy machinery until tomorrow (because of the sedation medicines used during the test).    FOLLOW UP: Our staff will call the number listed on your records the next business day following your procedure.  We will call around 7:15- 8:00 am to check on you and address any questions or concerns that you may have regarding the information given to you following your procedure. If we do not reach you, we will leave a message.     If any biopsies were taken you will be contacted by phone or by letter within the next 1-3 weeks.  Please call us at [phone] if you have not heard about the biopsies in 3 weeks.    SIGNATURES/CONFIDENTIALITY: You and/or your care partner have signed paperwork which will be entered into your electronic medical record.  These signatures attest to the fact that that the information above on your After Visit Summary has been reviewed and is understood.  Full responsibility of the confidentiality of this discharge information lies with you and/or your care-partner.

## 2022-04-07 NOTE — Progress Notes (Unsigned)
Pt's states no medical or surgical changes since previsit or office visit. 

## 2022-04-08 ENCOUNTER — Telehealth: Payer: Self-pay

## 2022-04-08 NOTE — Telephone Encounter (Signed)
  Follow up Call-     04/07/2022    8:04 AM  Call back number  Post procedure Call Back phone  # [phone]  Permission to leave phone message Yes     Patient questions:  Do you have a fever, pain , or abdominal swelling? No. Pain Score  0 *  Have you tolerated food without any problems? Yes.    Have you been able to return to your normal activities? Yes.    Do you have any questions about your discharge instructions: Diet   No. Medications  No. Follow up visit  No.  Do you have questions or concerns about your Care? No.  Actions: * If pain score is 4 or above: No action needed, pain <4.

## 2022-06-09 ENCOUNTER — Encounter: Payer: Self-pay | Admitting: Podiatry

## 2022-06-09 ENCOUNTER — Ambulatory Visit (INDEPENDENT_AMBULATORY_CARE_PROVIDER_SITE_OTHER): Payer: 59 | Admitting: Podiatry

## 2022-06-09 DIAGNOSIS — L84 Corns and callosities: Secondary | ICD-10-CM | POA: Diagnosis not present

## 2022-06-09 DIAGNOSIS — M79675 Pain in left toe(s): Secondary | ICD-10-CM | POA: Diagnosis not present

## 2022-06-09 DIAGNOSIS — M79674 Pain in right toe(s): Secondary | ICD-10-CM

## 2022-06-09 DIAGNOSIS — B351 Tinea unguium: Secondary | ICD-10-CM

## 2022-06-09 NOTE — Progress Notes (Signed)
This patient returns to the office for evaluation and treatment of long thick painful nails .  This patient is unable to trim his own nails since the patient cannot reach his feet.  Patient says the nails are painful walking and wearing his shoes.  Patient has painful callus on  fifth toe right foot. He returns for preventive foot care services.  General Appearance  Alert, conversant and in no acute stress.  Vascular  Dorsalis pedis and posterior tibial  pulses are palpable  bilaterally.  Capillary return is within normal limits  bilaterally. Temperature is within normal limits  bilaterally.  Neurologic  Senn-Weinstein monofilament wire test within normal limits  bilaterally. Muscle power within normal limits bilaterally.  Nails Thick disfigured discolored nails with subungual debris  from hallux to fifth toes bilaterally. No evidence of bacterial infection or drainage bilaterally.  Orthopedic  No limitations of motion  feet .  No crepitus or effusions noted.  No bony pathology or digital deformities noted.  Adducto-varus fifth toes  B/L.  Skin  normotropic skin with no porokeratosis noted bilaterally.  No signs of infections or ulcers noted. Corn 5th toe right foot.   Onychomycosis  Pain in toes right foot  Pain in toes left foot   HD 5th right.  Debridement  of nails  1-5  B/L with a nail nipper.  Nails were then filed using a dremel tool with no incidents.   Debride corn with dremel tool. RTC  3 months   Gardiner Barefoot DPM

## 2022-09-08 ENCOUNTER — Encounter: Payer: Self-pay | Admitting: Podiatry

## 2022-09-08 ENCOUNTER — Ambulatory Visit (INDEPENDENT_AMBULATORY_CARE_PROVIDER_SITE_OTHER): Payer: 59 | Admitting: Podiatry

## 2022-09-08 DIAGNOSIS — L84 Corns and callosities: Secondary | ICD-10-CM

## 2022-09-08 DIAGNOSIS — M79675 Pain in left toe(s): Secondary | ICD-10-CM

## 2022-09-08 DIAGNOSIS — M79674 Pain in right toe(s): Secondary | ICD-10-CM

## 2022-09-08 DIAGNOSIS — B351 Tinea unguium: Secondary | ICD-10-CM

## 2022-09-08 NOTE — Progress Notes (Signed)
This patient returns to the office for evaluation and treatment of long thick painful nails .  This patient is unable to trim his own nails since the patient cannot reach his feet.  Patient says the nails are painful walking and wearing his shoes.  Patient has painful callus on  fifth toe right foot. He returns for preventive foot care services.  General Appearance  Alert, conversant and in no acute stress.  Vascular  Dorsalis pedis and posterior tibial  pulses are palpable  bilaterally.  Capillary return is within normal limits  bilaterally. Temperature is within normal limits  bilaterally.  Neurologic  Senn-Weinstein monofilament wire test within normal limits  bilaterally. Muscle power within normal limits bilaterally.  Nails Thick disfigured discolored nails with subungual debris  from hallux to fifth toes bilaterally. No evidence of bacterial infection or drainage bilaterally.  Orthopedic  No limitations of motion  feet .  No crepitus or effusions noted.  No bony pathology or digital deformities noted.  Adducto-varus fifth toes  B/L.  Skin  normotropic skin with no porokeratosis noted bilaterally.  No signs of infections or ulcers noted. Listers corn 5th toe right foot.   Onychomycosis  Pain in toes right foot  Pain in toes left foot   HD 5th right.  Debridement  of nails  1-5  B/L with a nail nipper.  Nails were then filed using a dremel tool with no incidents.   Debride corn with dremel tool.   RTC  3 months   Helane Gunther DPM

## 2022-09-22 ENCOUNTER — Other Ambulatory Visit: Payer: Self-pay | Admitting: Internal Medicine

## 2022-09-22 DIAGNOSIS — I10 Essential (primary) hypertension: Secondary | ICD-10-CM

## 2022-11-22 ENCOUNTER — Other Ambulatory Visit: Payer: Self-pay | Admitting: Student

## 2022-11-22 DIAGNOSIS — Z8619 Personal history of other infectious and parasitic diseases: Secondary | ICD-10-CM

## 2022-12-08 ENCOUNTER — Ambulatory Visit: Admission: RE | Admit: 2022-12-08 | Payer: 59 | Source: Ambulatory Visit

## 2022-12-08 ENCOUNTER — Ambulatory Visit: Payer: 59 | Admitting: Podiatry

## 2022-12-08 DIAGNOSIS — Z8619 Personal history of other infectious and parasitic diseases: Secondary | ICD-10-CM

## 2022-12-15 ENCOUNTER — Ambulatory Visit (INDEPENDENT_AMBULATORY_CARE_PROVIDER_SITE_OTHER): Payer: 59 | Admitting: Podiatry

## 2022-12-15 ENCOUNTER — Encounter: Payer: Self-pay | Admitting: Podiatry

## 2022-12-15 DIAGNOSIS — M79675 Pain in left toe(s): Secondary | ICD-10-CM | POA: Diagnosis not present

## 2022-12-15 DIAGNOSIS — M79674 Pain in right toe(s): Secondary | ICD-10-CM

## 2022-12-15 DIAGNOSIS — L84 Corns and callosities: Secondary | ICD-10-CM | POA: Diagnosis not present

## 2022-12-15 DIAGNOSIS — B351 Tinea unguium: Secondary | ICD-10-CM

## 2022-12-15 NOTE — Progress Notes (Signed)
This patient returns to the office for evaluation and treatment of long thick painful nails .  This patient is unable to trim his own nails since the patient cannot reach his feet.  Patient says the nails are painful walking and wearing his shoes.  Patient has painful callus on  fifth toe right foot. He returns for preventive foot care services.  General Appearance  Alert, conversant and in no acute stress.  Vascular  Dorsalis pedis and posterior tibial  pulses are palpable  bilaterally.  Capillary return is within normal limits  bilaterally. Temperature is within normal limits  bilaterally.  Neurologic  Senn-Weinstein monofilament wire test within normal limits  bilaterally. Muscle power within normal limits bilaterally.  Nails Thick disfigured discolored nails with subungual debris  from hallux to fifth toes bilaterally. No evidence of bacterial infection or drainage bilaterally.  Orthopedic  No limitations of motion  feet .  No crepitus or effusions noted.  No bony pathology or digital deformities noted.  Adducto-varus fifth toes  B/L.  Skin  normotropic skin with no porokeratosis noted bilaterally.  No signs of infections or ulcers noted. Listers corn 5th toe right foot.   Onychomycosis  Pain in toes right foot  Pain in toes left foot   HD 5th right. Listers Corn.  Debridement  of nails  1-5  B/L with a nail nipper.  Nails were then filed using a dremel tool with no incidents.   Debride corn with dremel tool.   RTC  3 months   Helane Gunther DPM

## 2023-03-23 ENCOUNTER — Ambulatory Visit: Payer: 59 | Admitting: Podiatry

## 2023-04-13 ENCOUNTER — Encounter: Payer: Self-pay | Admitting: Podiatry

## 2023-04-13 ENCOUNTER — Ambulatory Visit (INDEPENDENT_AMBULATORY_CARE_PROVIDER_SITE_OTHER): Payer: 59 | Admitting: Podiatry

## 2023-04-13 DIAGNOSIS — L84 Corns and callosities: Secondary | ICD-10-CM

## 2023-04-13 DIAGNOSIS — M79675 Pain in left toe(s): Secondary | ICD-10-CM | POA: Diagnosis not present

## 2023-04-13 DIAGNOSIS — B351 Tinea unguium: Secondary | ICD-10-CM

## 2023-04-13 DIAGNOSIS — M79674 Pain in right toe(s): Secondary | ICD-10-CM | POA: Diagnosis not present

## 2023-04-13 NOTE — Progress Notes (Signed)
This patient returns to the office for evaluation and treatment of long thick painful nails .  This patient is unable to trim his own nails since the patient cannot reach his feet.  Patient says the nails are painful walking and wearing his shoes.  Patient has painful callus on  fifth toe right foot. He returns for preventive foot care services.  General Appearance  Alert, conversant and in no acute stress.  Vascular  Dorsalis pedis and posterior tibial  pulses are palpable  bilaterally.  Capillary return is within normal limits  bilaterally. Temperature is within normal limits  bilaterally.  Neurologic  Senn-Weinstein monofilament wire test within normal limits  bilaterally. Muscle power within normal limits bilaterally.  Nails Thick disfigured discolored nails with subungual debris  from hallux to fifth toes bilaterally. No evidence of bacterial infection or drainage bilaterally.  Orthopedic  No limitations of motion  feet .  No crepitus or effusions noted.  No bony pathology or digital deformities noted.  Adducto-varus fifth toes  B/L.  Skin  normotropic skin with no porokeratosis noted bilaterally.  No signs of infections or ulcers noted. Listers corn 5th toe right foot.   Onychomycosis  Pain in toes right foot  Pain in toes left foot   HD 5th right. Listers Corn.  Debridement  of nails  1-5  B/L with a nail nipper.  Nails were then filed using a dremel tool with no incidents.   Debride corn with dremel tool.   RTC  3 months   Helane Gunther DPM

## 2023-07-20 ENCOUNTER — Encounter: Payer: Self-pay | Admitting: Podiatry

## 2023-07-20 ENCOUNTER — Ambulatory Visit (INDEPENDENT_AMBULATORY_CARE_PROVIDER_SITE_OTHER): Payer: 59 | Admitting: Podiatry

## 2023-07-20 DIAGNOSIS — M79675 Pain in left toe(s): Secondary | ICD-10-CM | POA: Diagnosis not present

## 2023-07-20 DIAGNOSIS — B351 Tinea unguium: Secondary | ICD-10-CM | POA: Diagnosis not present

## 2023-07-20 DIAGNOSIS — M79674 Pain in right toe(s): Secondary | ICD-10-CM

## 2023-07-20 DIAGNOSIS — L84 Corns and callosities: Secondary | ICD-10-CM

## 2023-07-20 NOTE — Progress Notes (Signed)
This patient returns to the office for evaluation and treatment of long thick painful nails .  This patient is unable to trim his own nails since the patient cannot reach his feet.  Patient says the nails are painful walking and wearing his shoes.  Patient has painful callus on  fifth toe right foot. He returns for preventive foot care services.  General Appearance  Alert, conversant and in no acute stress.  Vascular  Dorsalis pedis and posterior tibial  pulses are palpable  bilaterally.  Capillary return is within normal limits  bilaterally. Temperature is within normal limits  bilaterally.  Neurologic  Senn-Weinstein monofilament wire test within normal limits  bilaterally. Muscle power within normal limits bilaterally.  Nails Thick disfigured discolored nails with subungual debris  from hallux to fifth toes bilaterally. No evidence of bacterial infection or drainage bilaterally.  Orthopedic  No limitations of motion  feet .  No crepitus or effusions noted.  No bony pathology or digital deformities noted.  Adducto-varus fifth toes  B/L.  Skin  normotropic skin with no porokeratosis noted bilaterally.  No signs of infections or ulcers noted. Listers corn 5th toe right foot.   Onychomycosis  Pain in toes right foot  Pain in toes left foot   HD 5th right. Listers Corn.  Debridement  of nails  1-5  B/L with a nail nipper.  Nails were then filed using a dremel tool with no incidents.   Debride corn with dremel tool.   RTC  3 months   Helane Gunther DPM

## 2023-07-26 ENCOUNTER — Other Ambulatory Visit: Payer: Self-pay | Admitting: Student

## 2023-07-26 DIAGNOSIS — K7460 Unspecified cirrhosis of liver: Secondary | ICD-10-CM

## 2023-08-01 ENCOUNTER — Ambulatory Visit
Admission: RE | Admit: 2023-08-01 | Discharge: 2023-08-01 | Disposition: A | Discharge status: Home / Self Care | Source: Ambulatory Visit | Attending: Student | Admitting: Student

## 2023-08-01 DIAGNOSIS — K7460 Unspecified cirrhosis of liver: Secondary | ICD-10-CM

## 2023-11-04 ENCOUNTER — Ambulatory Visit: Admitting: Podiatry

## 2023-11-09 ENCOUNTER — Encounter: Payer: Self-pay | Admitting: Podiatry

## 2023-11-09 ENCOUNTER — Ambulatory Visit (INDEPENDENT_AMBULATORY_CARE_PROVIDER_SITE_OTHER): Admitting: Podiatry

## 2023-11-09 DIAGNOSIS — M79675 Pain in left toe(s): Secondary | ICD-10-CM | POA: Diagnosis not present

## 2023-11-09 DIAGNOSIS — L84 Corns and callosities: Secondary | ICD-10-CM

## 2023-11-09 DIAGNOSIS — M79674 Pain in right toe(s): Secondary | ICD-10-CM | POA: Diagnosis not present

## 2023-11-09 DIAGNOSIS — B351 Tinea unguium: Secondary | ICD-10-CM | POA: Diagnosis not present

## 2023-11-09 NOTE — Progress Notes (Signed)
 This patient returns to the office for evaluation and treatment of long thick painful nails .  This patient is unable to trim his own nails since the patient cannot reach his feet.  Patient says the nails are painful walking and wearing his shoes.  Patient has painful callus on  fifth toe right foot. He returns for preventive foot care services.  General Appearance  Alert, conversant and in no acute stress.  Vascular  Dorsalis pedis and posterior tibial  pulses are palpable  bilaterally.  Capillary return is within normal limits  bilaterally. Temperature is within normal limits  bilaterally.  Neurologic  Senn-Weinstein monofilament wire test within normal limits  bilaterally. Muscle power within normal limits bilaterally.  Nails Thick disfigured discolored nails with subungual debris  from hallux to fifth toes bilaterally. No evidence of bacterial infection or drainage bilaterally.  Orthopedic  No limitations of motion  feet .  No crepitus or effusions noted.  No bony pathology or digital deformities noted.  Adducto-varus fifth toes  B/L.  Skin  normotropic skin with no porokeratosis noted bilaterally.  No signs of infections or ulcers noted. Listers corn 5th toe right foot.   Onychomycosis  Pain in toes right foot  Pain in toes left foot   HD 5th right. Padding dispensed for fifth toes  B/L.  Debridement  of nails  1-5  B/L with a nail nipper.  Nails were then filed using a dremel tool with no incidents.   Padding dispensed for 5th toe  B/L.   RTC  3 months   Cordella Bold DPM

## 2024-02-06 ENCOUNTER — Ambulatory Visit (INDEPENDENT_AMBULATORY_CARE_PROVIDER_SITE_OTHER): Admitting: Podiatry

## 2024-02-06 ENCOUNTER — Encounter: Payer: Self-pay | Admitting: Podiatry

## 2024-02-06 DIAGNOSIS — M79675 Pain in left toe(s): Secondary | ICD-10-CM | POA: Diagnosis not present

## 2024-02-06 DIAGNOSIS — L84 Corns and callosities: Secondary | ICD-10-CM

## 2024-02-06 DIAGNOSIS — B351 Tinea unguium: Secondary | ICD-10-CM | POA: Diagnosis not present

## 2024-02-06 DIAGNOSIS — M79674 Pain in right toe(s): Secondary | ICD-10-CM | POA: Diagnosis not present

## 2024-02-06 NOTE — Progress Notes (Signed)
 This patient returns to the office for evaluation and treatment of long thick painful nails .  This patient is unable to trim his own nails since the patient cannot reach his feet.  Patient says the nails are painful walking and wearing his shoes.  Patient has painful callus on  fifth toe right foot. He returns for preventive foot care services.  General Appearance  Alert, conversant and in no acute stress.  Vascular  Dorsalis pedis and posterior tibial  pulses are palpable  bilaterally.  Capillary return is within normal limits  bilaterally. Temperature is within normal limits  bilaterally.  Neurologic  Senn-Weinstein monofilament wire test within normal limits  bilaterally. Muscle power within normal limits bilaterally.  Nails Thick disfigured discolored nails with subungual debris  from hallux to fifth toes bilaterally. No evidence of bacterial infection or drainage bilaterally.  Orthopedic  No limitations of motion  feet .  No crepitus or effusions noted.  No bony pathology or digital deformities noted.  Adducto-varus fifth toes  B/L.  Skin  normotropic skin with no porokeratosis noted bilaterally.  No signs of infections or ulcers noted. Listers corn 5th toe right foot.   Onychomycosis  Pain in toes right foot  Pain in toes left foot   HD 5th right. Padding dispensed for fifth toes  B/L.  Debridement  of nails  1-5  B/L with a nail nipper.  Nails were then filed using a dremel tool with no incidents.   Padding dispensed for 5th toe  B/L.   RTC  3 months   Cordella Bold DPM

## 2024-05-07 ENCOUNTER — Encounter: Payer: Self-pay | Admitting: Podiatry

## 2024-05-07 ENCOUNTER — Ambulatory Visit: Admitting: Podiatry

## 2024-05-07 DIAGNOSIS — M79675 Pain in left toe(s): Secondary | ICD-10-CM | POA: Diagnosis not present

## 2024-05-07 DIAGNOSIS — L84 Corns and callosities: Secondary | ICD-10-CM | POA: Diagnosis not present

## 2024-05-07 DIAGNOSIS — B351 Tinea unguium: Secondary | ICD-10-CM | POA: Diagnosis not present

## 2024-05-07 DIAGNOSIS — M79674 Pain in right toe(s): Secondary | ICD-10-CM | POA: Diagnosis not present

## 2024-05-07 NOTE — Progress Notes (Signed)
 This patient returns to the office for evaluation and treatment of long thick painful nails .  This patient is unable to trim his own nails since the patient cannot reach his feet.  Patient says the nails are painful walking and wearing his shoes.  Patient has painful callus on  fifth toe right foot. He returns for preventive foot care services.  General Appearance  Alert, conversant and in no acute stress.  Vascular  Dorsalis pedis and posterior tibial  pulses are palpable  bilaterally.  Capillary return is within normal limits  bilaterally. Temperature is within normal limits  bilaterally.  Neurologic  Senn-Weinstein monofilament wire test within normal limits  bilaterally. Muscle power within normal limits bilaterally.  Nails Thick disfigured discolored nails with subungual debris  from hallux to fifth toes bilaterally. No evidence of bacterial infection or drainage bilaterally.  Orthopedic  No limitations of motion  feet .  No crepitus or effusions noted.  No bony pathology or digital deformities noted.  Adducto-varus fifth toes  B/L.  Skin  normotropic skin with no porokeratosis noted bilaterally.  No signs of infections or ulcers noted. Listers corn 5th toe right foot.   Onychomycosis  Pain in toes right foot  Pain in toes left foot   HD 5th right.  Corn fifth toe left foot.  Debridement  of nails  1-5  B/L with a nail nipper.  Nails were then filed using a dremel tool with no incidents.   Padding dispensed for 5th toe  B/L.   RTC  3 months   Cordella Bold DPM

## 2024-08-06 ENCOUNTER — Ambulatory Visit: Admitting: Podiatry
